# Patient Record
Sex: Female | Born: 1960
Health system: Southern US, Community
[De-identification: ages and names within clinical notes are randomized; demographics above are authoritative.]

## PROBLEM LIST (undated history)

## (undated) DIAGNOSIS — F419 Anxiety disorder, unspecified: Secondary | ICD-10-CM

## (undated) DIAGNOSIS — K829 Disease of gallbladder, unspecified: Secondary | ICD-10-CM

## (undated) DIAGNOSIS — E669 Obesity, unspecified: Secondary | ICD-10-CM

## (undated) DIAGNOSIS — K219 Gastro-esophageal reflux disease without esophagitis: Secondary | ICD-10-CM

## (undated) DIAGNOSIS — M549 Dorsalgia, unspecified: Secondary | ICD-10-CM

## (undated) DIAGNOSIS — I10 Essential (primary) hypertension: Secondary | ICD-10-CM

## (undated) DIAGNOSIS — K5790 Diverticulosis of intestine, part unspecified, without perforation or abscess without bleeding: Secondary | ICD-10-CM

## (undated) DIAGNOSIS — J45909 Unspecified asthma, uncomplicated: Secondary | ICD-10-CM

## (undated) DIAGNOSIS — R0602 Shortness of breath: Secondary | ICD-10-CM

## (undated) HISTORY — DX: Anxiety disorder, unspecified: F41.9

## (undated) HISTORY — DX: Disease of gallbladder, unspecified: K82.9

## (undated) HISTORY — DX: Dorsalgia, unspecified: M54.9

## (undated) HISTORY — DX: Obesity, unspecified: E66.9

## (undated) HISTORY — DX: Unspecified asthma, uncomplicated: J45.909

## (undated) HISTORY — PX: TONSILLECTOMY: SUR1361

## (undated) HISTORY — DX: Diverticulosis of intestine, part unspecified, without perforation or abscess without bleeding: K57.90

## (undated) HISTORY — PX: TRIGGER FINGER RELEASE: SHX641

## (undated) HISTORY — DX: Shortness of breath: R06.02

---
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
## 1993-12-14 HISTORY — PX: SINUS EXPLORATION: SHX5214

## 2000-12-14 HISTORY — PX: CHOLECYSTECTOMY: SHX55

## 2002-01-13 ENCOUNTER — Encounter: Payer: Self-pay | Admitting: Family Medicine

## 2002-01-13 ENCOUNTER — Ambulatory Visit (HOSPITAL_COMMUNITY): Admission: RE | Admit: 2002-01-13 | Discharge: 2002-01-13 | Payer: Self-pay | Admitting: Family Medicine

## 2002-10-22 ENCOUNTER — Inpatient Hospital Stay (HOSPITAL_COMMUNITY): Admission: EM | Admit: 2002-10-22 | Discharge: 2002-10-24 | Payer: Self-pay | Admitting: Emergency Medicine

## 2002-10-22 ENCOUNTER — Encounter: Payer: Self-pay | Admitting: Emergency Medicine

## 2002-10-26 ENCOUNTER — Ambulatory Visit (HOSPITAL_COMMUNITY): Admission: RE | Admit: 2002-10-26 | Discharge: 2002-10-26 | Payer: Self-pay | Admitting: Family Medicine

## 2002-10-26 ENCOUNTER — Encounter: Payer: Self-pay | Admitting: Family Medicine

## 2002-11-23 ENCOUNTER — Encounter: Payer: Self-pay | Admitting: Family Medicine

## 2002-11-23 ENCOUNTER — Ambulatory Visit (HOSPITAL_COMMUNITY): Admission: RE | Admit: 2002-11-23 | Discharge: 2002-11-23 | Payer: Self-pay | Admitting: Family Medicine

## 2005-01-06 ENCOUNTER — Other Ambulatory Visit: Admission: RE | Admit: 2005-01-06 | Discharge: 2005-01-06 | Payer: Self-pay | Admitting: Obstetrics and Gynecology

## 2005-01-13 ENCOUNTER — Ambulatory Visit (HOSPITAL_COMMUNITY): Admission: RE | Admit: 2005-01-13 | Discharge: 2005-01-13 | Payer: Self-pay | Admitting: Obstetrics and Gynecology

## 2005-05-26 ENCOUNTER — Ambulatory Visit: Payer: Self-pay | Admitting: Family Medicine

## 2005-05-26 ENCOUNTER — Emergency Department (HOSPITAL_COMMUNITY): Admission: EM | Admit: 2005-05-26 | Discharge: 2005-05-26 | Payer: Self-pay | Admitting: Emergency Medicine

## 2005-09-24 ENCOUNTER — Encounter (HOSPITAL_COMMUNITY): Admission: RE | Admit: 2005-09-24 | Discharge: 2005-10-24 | Payer: Self-pay | Admitting: Family Medicine

## 2005-12-24 ENCOUNTER — Encounter (HOSPITAL_COMMUNITY): Admission: RE | Admit: 2005-12-24 | Discharge: 2006-01-23 | Payer: Self-pay | Admitting: General Surgery

## 2006-01-04 ENCOUNTER — Inpatient Hospital Stay (HOSPITAL_COMMUNITY): Admission: AD | Admit: 2006-01-04 | Discharge: 2006-01-06 | Payer: Self-pay | Admitting: General Surgery

## 2006-01-05 ENCOUNTER — Encounter (INDEPENDENT_AMBULATORY_CARE_PROVIDER_SITE_OTHER): Payer: Self-pay | Admitting: General Surgery

## 2006-03-24 ENCOUNTER — Emergency Department (HOSPITAL_COMMUNITY): Admission: EM | Admit: 2006-03-24 | Discharge: 2006-03-24 | Payer: Self-pay | Admitting: Emergency Medicine

## 2006-05-24 ENCOUNTER — Other Ambulatory Visit: Admission: RE | Admit: 2006-05-24 | Discharge: 2006-05-24 | Payer: Self-pay | Admitting: Obstetrics and Gynecology

## 2006-05-24 ENCOUNTER — Ambulatory Visit (HOSPITAL_COMMUNITY): Admission: RE | Admit: 2006-05-24 | Discharge: 2006-05-24 | Payer: Self-pay | Admitting: Obstetrics and Gynecology

## 2006-07-02 ENCOUNTER — Emergency Department (HOSPITAL_COMMUNITY): Admission: EM | Admit: 2006-07-02 | Discharge: 2006-07-02 | Payer: Self-pay | Admitting: Emergency Medicine

## 2006-10-05 ENCOUNTER — Emergency Department (HOSPITAL_COMMUNITY): Admission: RE | Admit: 2006-10-05 | Discharge: 2006-10-05 | Payer: Self-pay | Admitting: Family Medicine

## 2006-12-14 HISTORY — PX: ABDOMINAL HYSTERECTOMY: SHX81

## 2007-02-09 ENCOUNTER — Ambulatory Visit (HOSPITAL_COMMUNITY): Admission: RE | Admit: 2007-02-09 | Discharge: 2007-02-09 | Payer: Self-pay | Admitting: Obstetrics and Gynecology

## 2007-02-21 ENCOUNTER — Ambulatory Visit (HOSPITAL_COMMUNITY): Admission: RE | Admit: 2007-02-21 | Discharge: 2007-02-21 | Payer: Self-pay | Admitting: Family Medicine

## 2007-03-11 ENCOUNTER — Encounter (INDEPENDENT_AMBULATORY_CARE_PROVIDER_SITE_OTHER): Payer: Self-pay | Admitting: Specialist

## 2007-03-11 ENCOUNTER — Ambulatory Visit (HOSPITAL_COMMUNITY): Admission: RE | Admit: 2007-03-11 | Discharge: 2007-03-11 | Payer: Self-pay | Admitting: Obstetrics and Gynecology

## 2007-03-30 ENCOUNTER — Ambulatory Visit: Admission: RE | Admit: 2007-03-30 | Discharge: 2007-03-30 | Payer: Self-pay | Admitting: Gynecologic Oncology

## 2007-04-19 ENCOUNTER — Encounter (INDEPENDENT_AMBULATORY_CARE_PROVIDER_SITE_OTHER): Payer: Self-pay | Admitting: Specialist

## 2007-04-19 ENCOUNTER — Inpatient Hospital Stay (HOSPITAL_COMMUNITY): Admission: RE | Admit: 2007-04-19 | Discharge: 2007-04-20 | Payer: Self-pay | Admitting: Obstetrics and Gynecology

## 2007-05-10 ENCOUNTER — Ambulatory Visit: Admission: RE | Admit: 2007-05-10 | Discharge: 2007-05-10 | Payer: Self-pay | Admitting: Gynecologic Oncology

## 2007-09-21 ENCOUNTER — Ambulatory Visit (HOSPITAL_COMMUNITY): Admission: RE | Admit: 2007-09-21 | Discharge: 2007-09-21 | Payer: Self-pay | Admitting: Obstetrics and Gynecology

## 2007-12-15 ENCOUNTER — Encounter: Payer: Self-pay | Admitting: Family Medicine

## 2008-02-14 ENCOUNTER — Ambulatory Visit (HOSPITAL_COMMUNITY): Admission: RE | Admit: 2008-02-14 | Discharge: 2008-02-14 | Payer: Self-pay | Admitting: Family Medicine

## 2008-02-16 ENCOUNTER — Ambulatory Visit (HOSPITAL_COMMUNITY): Admission: RE | Admit: 2008-02-16 | Discharge: 2008-02-16 | Payer: Self-pay | Admitting: Emergency Medicine

## 2008-02-17 ENCOUNTER — Ambulatory Visit: Payer: Self-pay | Admitting: Cardiology

## 2009-07-02 DIAGNOSIS — I10 Essential (primary) hypertension: Secondary | ICD-10-CM | POA: Insufficient documentation

## 2009-07-02 DIAGNOSIS — R0789 Other chest pain: Secondary | ICD-10-CM | POA: Insufficient documentation

## 2009-11-04 ENCOUNTER — Encounter (HOSPITAL_COMMUNITY): Admission: RE | Admit: 2009-11-04 | Discharge: 2009-12-04 | Payer: Self-pay | Admitting: Internal Medicine

## 2010-01-30 ENCOUNTER — Ambulatory Visit (HOSPITAL_COMMUNITY): Admission: RE | Admit: 2010-01-30 | Discharge: 2010-01-30 | Payer: Self-pay | Admitting: Family Medicine

## 2010-07-10 ENCOUNTER — Ambulatory Visit (HOSPITAL_BASED_OUTPATIENT_CLINIC_OR_DEPARTMENT_OTHER): Admission: RE | Admit: 2010-07-10 | Discharge: 2010-07-10 | Payer: Self-pay | Admitting: Orthopedic Surgery

## 2011-01-03 ENCOUNTER — Encounter: Payer: Self-pay | Admitting: Family Medicine

## 2011-01-03 ENCOUNTER — Encounter: Payer: Self-pay | Admitting: General Surgery

## 2011-01-04 ENCOUNTER — Encounter: Payer: Self-pay | Admitting: Family Medicine

## 2011-01-04 ENCOUNTER — Encounter: Payer: Self-pay | Admitting: Obstetrics and Gynecology

## 2011-01-04 ENCOUNTER — Encounter: Payer: Self-pay | Admitting: Internal Medicine

## 2011-01-13 NOTE — Letter (Signed)
Summary: rpc chart  rpc chart   Imported By: Curtis Sites 09/25/2010 15:21:49  _____________________________________________________________________  External Attachment:    Type:   Image     Comment:   External Document

## 2011-02-28 LAB — POCT I-STAT, CHEM 8
Calcium, Ion: 1.14 mmol/L (ref 1.12–1.32)
Creatinine, Ser: 0.7 mg/dL (ref 0.4–1.2)
Hemoglobin: 12.9 g/dL (ref 12.0–15.0)
Sodium: 144 mEq/L (ref 135–145)
TCO2: 26 mmol/L (ref 0–100)

## 2011-02-28 LAB — POCT HEMOGLOBIN-HEMACUE: Hemoglobin: 12.1 g/dL (ref 12.0–15.0)

## 2011-02-28 LAB — GLUCOSE, CAPILLARY: Glucose-Capillary: 157 mg/dL — ABNORMAL HIGH (ref 70–99)

## 2011-04-28 NOTE — Letter (Signed)
February 17, 2008    [name], M.D.  [address], [address]  [place], [place] [address]   RE:  [name], [name]  MRN:  [id number]  /  DOB:  [birth year]   Dear Loraine Leriche:   It was my pleasure evaluating [name] in the office today at your  request for chest discomfort.  As you know, [name] suffered a  prolonged episode of mild-to-moderate chest pressure a few days ago  during our recent snowstorm.  She was cleaning her car in cold weather  when she felt chest pressure in the mid substernal region.  There was no  radiation.  There were no associated symptoms.  She could not find  anything that exacerbated or improved her discomfort.  She subsequently  returned to her house and noted fading of these sensations over the  course of an hour or two.  She came to your office a few days later and  was feeling fine.  Evaluation included an unremarkable chemistry  profile, a normal CBC, a borderline elevated hemoglobin A1c level, and a  borderline elevated D-dimer level.  CT scan of her chest showed minor  chronic changes at the left base.  She continues to feel well, but due  to multiple cardiovascular risk factors, there continues to be concern  for possible coronary disease.   [name] has never previously been evaluated by a cardiologist.  She has not undergone any significant cardiac testing.  She has been  treated for mild hypertension with good control.  She previously  required substantial treatment for diabetes but has not taken any  medication for some time after approximately a 150-pound weight loss.  Prior lipid profiles have reportedly been fairly good.   PAST MEDICAL HISTORY:  1. Remote tonsillectomy.  2. Cholecystectomy in January of 2007.  3. Hysterectomy in May of 2008 at which time she was continuing to      have menstrual periods.   CURRENT MEDICATIONS:  1. Premarin 1.25 mg daily.  2. Metoprolol 100 mg daily.  3. Amlodipine 10 mg daily.  4.  Benicar/HCT 20/12.5 mg daily.  5. Zegerid 1 daily.  6. Allegra 160 mg daily.   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   FAMILY HISTORY:  Sketchy; mother had diabetes.   REVIEW OF SYSTEMS:  Notable for the need for corrective lenses, upper  dentures, and a history of peptic ulcer disease and reflux.  All other  systems reviewed and are negative.   PHYSICAL EXAMINATION:  GENERAL:  Pleasant overweight [name] in no acute  distress.  VITAL SIGNS:  The weight is 240 pounds.  Blood pressure 145/80, heart  rate 75 and regular, respirations 16.  HEENT:  Grade 1 hypertensive changes on funduscopic exam.  NECK:  No jugular venous distention; normal carotid upstrokes without  bruits.  LUNGS:  Clear.  CARDIAC:  Normal first and second heart sounds; fourth heart sound  present with soft systolic ejection murmur.  ABDOMEN:  Soft and nontender; no organomegaly; excessive and lax skin.  EXTREMITIES:  Trace edema; distal pulses intact.  NEUROLOGIC:  Symmetric strength and tone; normal cranial nerves.  ENDOCRINE:  No thyromegaly.  HEMATOPOIETIC:  No adenopathy.  PSYCHIATRIC:  Alert and oriented; normal affect.   EKG:  Normal sinus rhythm; borderline left atrial abnormality;  borderline delayed R wave progression; otherwise unremarkable.  No prior  tracing for comparison.   LABORATORY DATA:  Laboratory from  your office is generally unremarkable.  Hemoglobin A1c level was 7.2.  Fasting glucose was 124.   IMPRESSION:  [name] had a single episode of somewhat worrisome  chest discomfort, both in terms of its quality and location as well as  the fact that it occurred in the setting of physical exertion and cold  exposure.  Nonetheless, [name] has had similar physiologic stress  since then without symptoms.  Her cardiovascular risk is somewhat  difficult to calculate in that she previously had significant  diabetes,  but improved dramatically with weight loss and has had only mild and  well-controlled hypertension.  I would consider the likelihood of  coronary disease to be well less than 25%.  We will proceed with a  stress echocardiogram if images are adequate; otherwise, a stress  nuclear study will be performed.  I will let you know the results of  that test as soon as it has been completed.  Otherwise, [name]  current medical therapy is generally excellent.  You might want to  discontinue Premarin as soon as she can tolerate doing so.   Thanks so much for sending Tina very nice lady to see me.    Sincerely,      Gerrit Friends. Dietrich Pates, MD, [facility]  Electronically Signed    RMR/MedQ  DD: 02/17/2008  DT: 02/19/2008  Job #: [id number]

## 2011-04-28 NOTE — Consult Note (Signed)
NAMEGLADYSE, [name]            ACCOUNT NO.:  [id number]   MEDICAL RECORD NO.:  [id number]          PATIENT TYPE:  OUT   LOCATION:  GYN                          FACILITY:  [facility]   PHYSICIAN:  Paola A. Duard Brady, MD    DATE OF BIRTH:  10/28/1961   DATE OF CONSULTATION:  05/10/2007  DATE OF DISCHARGE:                                 CONSULTATION   The patient is a 50 year old with diagnosis of endometrial cancer who  underwent TLH/BSO on Apr 19, 2007.  Pathology was consistent with a stage  IB, grade 1, endometrioid adenocarcinoma with negative washings, 2 mm of  myometrial invasion out of 3.9 cm of myometrial invasion.  No  lymphovascular space involvement with negative washings.  She comes in  today for her postoperative check.  She has multiple complaints.  She  complains of feeling tired and weak for the last 3 days.  She has not  been sleeping well.  She has not had this issue in the past.  She is not  napping.  When asked multiple times whether it is that she is having  intermittent waking or not being able to fall asleep, she has never  really been able to answer the question.  It does not seem that hot  flashes are waking her up.  She just cannot sleep and she denies  sleeping excessively during the day.  She does complain of hot flashes,  about one every hour and half.  Effexor was called in with no relief of  her symptoms, however, she only took it 1 day and stopped taking it  because it caused her to feel weak.  She is also complaining of some low  back pain, some low pelvic pain and pressure.  She is voiding about  every 30 minutes.  She feels dehydrated and, when queried, she is  drinking less than a liter per day.  She, otherwise, denies any pain.  She states the pain she has had from this surgery has been less than  that she had from her cholecystectomy.   PHYSICAL EXAMINATION:  Well-nourished, well-developed female in no acute  distress.  ABDOMEN:  Shows well-healed  laparoscopy skin incisions.  Abdomen is  soft, nontender, nondistended, it is morbidly obese.  There is a  significant pannus.  There is no erythema or discharge.  PELVIC:  External genitalia within normal limits, though somewhat atrophic.  The  vaginal is visualized, the vaginal cuff is visualized.  Sutures are  still visible.  She has a small amount of exudate in the midportion of  the cuff which is easily wiped away.  BIMANUAL EXAMINATION:  There is no cuff tenderness.  There are no  palpable masses.   ASSESSMENT:  90. A 50 year old who has stage IB, grade 1, endometrial carcinoma, who      is doing fairly well from a postoperative standpoint.  Her symptoms      are consistent with a potential urinary tract infection.  I do not      have a away of sending urinalysis here in the clinic.  Therefore,  will treat her empirically with Bactrim DS, one orally twice a day.      I discussed with her the need to drink more fluids than she is      drinking, she is not drinking enough, and encouraged her to drink      about 64 ounces a day at a minimum and to include cranberry juice,      water, and noncaffeinated beverages.  2. We also discussed her vasomotor symptoms.  She is having      significant symptoms which are interfering with her quality of      life.  After a discussion regarding risks and benefits, she opted      for Premarin.  She was given prescription and samples for 0.625      milligrams, one orally daily.  She was alerted of the risks of      Premarin, including but not limited to thromboembolic events, and      she was given signs and symptoms to monitor and to      notify us if she has any symptomatology.  3. She will return to see [place] in 4 months.  At that time we will begin      alternating visits with Dr. Sylvester Harder.   The patient's questions were elicited and answered to her satisfaction.  She is feeling better after her visit today.      Paola A. Duard Brady,  MD  Electronically Signed     PAG/MEDQ  D:  05/10/2007  T:  05/10/2007  Job:  [id number]   cc:   Fayrene Fearing A. Ashley Royalty, M.D.  Fax: [phone]   Telford Nab, R.N.  [address]  [place], [place] [address]

## 2011-05-01 NOTE — Procedures (Signed)
NAMETVISHA, SCHWOERER            ACCOUNT NO.:  [id number]   MEDICAL RECORD NO.:  [id number]          PATIENT TYPE:  EMS   LOCATION:  ED                            FACILITY:  [facility]   PHYSICIAN:  Edward L. Juanetta Gosling, M.D.DATE OF BIRTH:  [birth year]   DATE OF PROCEDURE:  05/26/2005  DATE OF DISCHARGE:  05/26/2005                                EKG INTERPRETATION   IMPRESSION:  The rhythm is sinus rhythm with a rate of 70. There is  generally high QRS voltage. The computers read early repolarization and I do  not see evidence of that. Minimally abnormal electrocardiogram.       ELH/MEDQ  D:  05/27/2005  T:  05/28/2005  Job:  [id number]

## 2011-05-01 NOTE — Discharge Summary (Signed)
[name], [name]            ACCOUNT NO.:  [id number]   MEDICAL RECORD NO.:  [id number]          PATIENT TYPE:  INP   LOCATION:  A338                          FACILITY:  [facility]   PHYSICIAN:  Dirk Dress. Katrinka Blazing, M.D.   DATE OF BIRTH:  September 12, 1961   DATE OF ADMISSION:  01/04/2006  DATE OF DISCHARGE:  01/24/2007LH                                 DISCHARGE SUMMARY   DISCHARGE DIAGNOSES:  1.  Acalculous cholecystitis.  2.  Hypertension.  3.  Diabetes mellitus.  4.  Lumbar disk disease.  5.  Obesity.   PROCEDURE:  Laparoscopic cholecystectomy January 23.   DISPOSITION:  The patient is discharged home in stable, satisfactory  condition.   DISCHARGE MEDICATIONS:  1.  Potassium chloride 20 mEq daily.  2.  Norvasc 10 mg daily.  3.  Toprol XL 100 mg daily.  4.  Benicar 20/12.5 daily.  5.  Aspirin 81 mg daily.   FOLLOW UP:  The patient will be seen in the office 2 weeks post discharge.   HOSPITAL COURSE:  A 50 year old female with a history of recurrent  epigastric and right upper quadrant pain with radiation through to her back  on the right side and subscapular area.  Pain has gotten worse over the past  2 weeks.  She two HIDA scans which showed an ejection fraction of 27% and  19.4%.  The patient was waiting to have his surgery scheduled electively,  but her pain became unrelenting and was quite severe on a daily basis.  She  had constant nausea without vomiting.  She had a very tender abdomen  suspicious for acute exacerbation of acalculous cholecystitis with acute  inflammation.  The patient was therefore admitted.  She was started on IV  antibiotics and urgent cholecystectomy was scheduled.  She underwent  cholecystectomy on January 23, uneventfully.  She was doing much better in  the postoperative period.  All of her pain, nausea and vomiting resolved.  Her back pain resolved.  She was discharged home on the postop day #1 in  satisfactory condition.     Dirk Dress. Katrinka Blazing,  M.D.  Electronically Signed    LCS/MEDQ  D:  02/14/2006  T:  02/15/2006  Job:  [id number]

## 2011-05-01 NOTE — H&P (Signed)
[name], [name]            ACCOUNT NO.:  [id number]   MEDICAL RECORD NO.:  [id number]          PATIENT TYPE:  INP   LOCATION:  A338                          FACILITY:  [facility]   PHYSICIAN:  Dirk Dress. Katrinka Blazing, M.D.   DATE OF BIRTH:  Aug 17, 1961   DATE OF ADMISSION:  01/04/2006  DATE OF DISCHARGE:  LH                                HISTORY & PHYSICAL   HISTORY OF PRESENT ILLNESS:  A 50 year old female with a history of  recurrent epigastric and right upper quadrant pain with radiation through to  her back on the right side in the subscapular level.  The pain has gotten  much worse over the past 2 weeks.  She has had 2 HIDA scans which show  reduced ejection fraction of 27% and 19.4%.  The patient was waiting to have  her surgery scheduled electively, but her pain has become unrelenting and is  severe everyday.  She has constant nausea without vomiting, and she has a  very tender abdomen, suspicious for acute exacerbation of acalculous  cholecystitis with acute inflammation.  The patient is admitted and will  make arrangements for IV antibiotics and schedule urgent cholecystectomy.   PAST HISTORY:  1.  He has hypertension.  2.  Gastroesophageal reflux disease.  3.  Noninsulin-dependent diabetes mellitus.  4.  Degenerative disk disease of the lumbar spine.  5.  Chronic obesity.   MEDICATIONS:  1.  Benicar 20/12.5 daily.  2.  Norvasc 10 mg daily.  3.  Phenergan 25 mg every 4 hours as needed.  4.  Lyrica 75 mg twice daily as needed.  5.  Nexium 40 mg daily.  6.  Aspirin 81 mg daily.  7.  Toprol-XL 100 mg daily.   SOCIAL HISTORY:  She is employed at [employer] as a Psychologist, sport and exercise.  There is no history of alcohol or drug abuse.  No history of tobacco use.   PHYSICAL EXAMINATION:  VITAL SIGNS:  Blood pressure 138/70, pulse 80,  respirations 20, weight 250 pounds.  HEENT:  Unremarkable.  NECK:  Supple.  No JVD, bruit, adenopathy or thyromegaly.  CHEST:  Clear to  auscultation.  HEART:  Regular rate and rhythm without murmur, gallop, or rub.  ABDOMEN:  Obese, distended.  Moderately severe epigastric and right upper  quadrant tenderness with guarding and rebound.  Active bowel sounds.  BACK:  Unremarkable.  It is felt that the pain she has in her back is a  radicular or referred type pain.  EXTREMITIES:  No clubbing, cyanosis, or edema.  NEUROLOGIC:  No focal motor, sensory or cerebellar deficits.   IMPRESSION:  1.  Acalculous cholecystitis, chronic, with acute exacerbation.  2.  Hypertension.  3.  Diet-controlled diabetes mellitus.  4.  Lumbar disk disease.  5.  Obesity.   PLAN:  The patient is admitted.  She will be started on IV antibiotics.  Her  symptoms will be controlled with analgesics and anti-emetics, and we will  make arrangements for a cholecystectomy in the morning.      Dirk Dress. Katrinka Blazing, M.D.  Electronically Signed     LCS/MEDQ  D:  01/04/2006  T:  01/04/2006  Job:  [id number]

## 2011-05-01 NOTE — Discharge Summary (Signed)
   [name], [name]                      ACCOUNT NO.:  [id number]   MEDICAL RECORD NO.:  [id number]                   PATIENT TYPE:  INP   LOCATION:  A308                                 FACILITY:  [facility]   PHYSICIAN:  Corrie Mckusick, M.D.               DATE OF BIRTH:  10/14/1961   DATE OF ADMISSION:  10/22/2002  DATE OF DISCHARGE:  10/24/2002                                 DISCHARGE SUMMARY   HISTORY OF PRESENT ILLNESS:  For history of presenting illness and past  medical history please see admission H&P.   HOSPITAL COURSE:  A 50 year old with diabetes, hypertension who presented  with viral gastroenteritis and viral labyrinthitis.  She was admitted for IV  fluids and monitoring.  She also presented with hypokalemia which has  resolved slowly.  Blood sugars remained well controlled during the hospital  stay.  Hypokalemia was also resolved.  On discharge, potassium had increased  to 3.6.  Vital signs had remained stable.  Blood pressure was coming down  nicely with the addition of Vasotec.   On the day of discharge the patient had improved greatly.  She was still  slightly dizzy but was able to ambulate quite easily.  Good p.o. intake.   DISCHARGE PHYSICAL EXAMINATION:  VITAL SIGNS:  Tmax 99.5, blood pressure 151  to 160 over 80, blood sugars are 100 to 124.  GENERAL:  A pleasant female in no acute distress.  CHEST:  Clear to auscultation bilaterally.  CARDIOVASCULAR:  Regular rhythm with no murmurs.  ABDOMEN:  Soft, nontender, nondistended.  EXTREMITIES:  No edema.   LABORATORY DATA:  As stated above.   DISCHARGE MEDICATIONS:  Are the same as admit plus:  1. Vasotec 10 mg daily.  2. Antivert 25 mg q.i.d. p.r.n.  3. Potassium chloride 10 mEq daily.   FOLLOW-UP:  With [facility] in one week after discharge and will  recheck Chem-7 at that time.  If any problems arise at any time she is to  call or return.                                               Corrie Mckusick, M.D.    JCG/MEDQ  D:  10/24/2002  T:  10/24/2002  Job:  [id number]

## 2011-05-01 NOTE — Consult Note (Signed)
[name], [name]            ACCOUNT NO.:  [id number]   MEDICAL RECORD NO.:  [id number]          PATIENT TYPE:  OUT   LOCATION:  GYN                          FACILITY:  [facility]   PHYSICIAN:  John T. Kyla Balzarine, M.D.    DATE OF BIRTH:  Apr 22, 1961   DATE OF CONSULTATION:  DATE OF DISCHARGE:                                 CONSULTATION   CHIEF COMPLAINT:  This 50 year old woman is seen at the request of Dr.  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   HISTORY OF PRESENT ILLNESS:  The patient presented with right-sided pain  and back pain.  She had an ultrasound which revealed thickening of the  endometrial and bilateral small, simple ovarian cysts.  Office biopsy  could not be performed because of stenosis.  She underwent hysteroscopy  with endometrial curettage and polypectomy on March 28, revealing  endometrial adenocarcinoma, FIGO grade I, arising in atypical complex  hyperplasia.  Patient has had only spotting since then.   PAST MEDICAL HISTORY:  Hypertension, GERD, status post wrist surgery,  cesarean section, and tubal sterilization.   MEDICATIONS:  Toprol, Benicar, aspirin, Norvasc, and multivitamins.  She  is on an H2 blocker for GERD.   ALLERGIES:  None known.   FAMILY HISTORY:  Hypertension and diabetes but no breast, gynecologic,  or colon malignancy.   PERSONAL SOCIAL HISTORY:  Denies tobacco or ethanol.   REVIEW OF SYSTEMS:  Other than above, negative.   PHYSICAL EXAMINATION:  VITAL SIGNS:  Weight 231 pounds.  Height 5 feet,  4 inches.  GENERAL:  Patient is anxious, alert, and oriented x3 in no acute  distress.  HEENT:  Benign with clear oropharynx.  NECK:  There is a supple neck without goiter.  LUNGS:  Lung fields are clear to auscultation and percussion.  HEART:  Heart sounds reveal a regular rate and rhythm.  No gallop or  JVD.  BACK:  There is no back or CVA  tenderness.  EXTREMITIES:  No edema, cords, or Homans.  SKIN:  No suspicious lesions.  NEUROLOGIC:  Intact.  PELVIC:  External genitalia and BUS are normal to inspection and  palpation.  Bladder and urethra are normal.  The vaginal mucosa is clear  with a small amount of old blood.  Cervix is small with healing  tenaculum site.  Bimanual and rectovaginal examination suggest upper  limits size uterus with no adnexal mass or parametrial nodularity.  There is some uterine mobility assessment.  Grade I endometrioid  adenocarcinoma.   PLAN:  I had a long discussion with the patient regarding management of  her disease.  I would advocate a total laparoscopic hysterectomy with  BSO and washings.  Pelvic lymph node dissection would be begun, and  specimen  sent for frozen section.  If minimal tumor, the node dissection would  not be extended to include aortic nodes.  We discussed risks for  conversion to laparotomy, risks and benefits of minimally invasive  approach versus laparotomy.  Surgery is tentatively scheduled for May  13.  John T. Kyla Balzarine, M.D.  Electronically Signed     JTS/MEDQ  D:  03/30/2007  T:  03/31/2007  Job:  [id number]   cc:   Fayrene Fearing A. Ashley Royalty, M.D.  Fax: [phone]   Telford Nab, R.N.  [address]  [place], [place] [address]

## 2011-05-01 NOTE — Op Note (Signed)
[name], [name]            ACCOUNT NO.:  [id number]   MEDICAL RECORD NO.:  [id number]          PATIENT TYPE:  INP   LOCATION:  A338                          FACILITY:  [facility]   PHYSICIAN:  Dirk Dress. Katrinka Blazing, M.D.   DATE OF BIRTH:  28-Jan-1961   DATE OF PROCEDURE:  01/05/2006  DATE OF DISCHARGE:                                 OPERATIVE REPORT   PREOPERATIVE DIAGNOSIS:  Acalculous cholecystitis.   POSTOPERATIVE DIAGNOSIS:  Acalculous cholecystitis.   PROCEDURE:  Laparoscopic cholecystectomy.   SURGEON:  Dirk Dress. Katrinka Blazing, M.D.   DESCRIPTION OF PROCEDURE:  Under general anesthesia the patient's abdomen  was prepped and draped in a sterile field.  A supraumbilical incision was  made.  A Veress needle was inserted uneventfully.  The abdomen was  insufflated with 3 liters of CO2.  Using a Vis-A-Port guide a 10-mm port was  placed.  A laparoscope was placed.  A very large, distended, slightly  thickened gallbladder was encountered.  The patient was placed in reverse  Trendelenburg position.   Under videoscopic guidance a 10-mm port and two 5-mm ports were placed in  the right subcostal region.  The gallbladder was grasped and positioned.  Adhesions to the lower gallbladder were dissected bluntly. The cystic duct  was dissected, clipped with 5 clips close to the infundibulum and divided.  The cystic artery had two branches.  They were dissected back tot he  gallbladder, clipped with 3 clips and divided.  Using electrocautery and the  hook dissector, the gallbladder was then separated from the infrahepatic bed  without difficulty. It was placed in an EndoCatch device and retrieved.   Irrigation was carried out  Hemostasis was felt to be adequate.  There was  essentially no bleeding.  Irrigation was carried out until the fluid was  clear.  CO2 was allowed to escape from the abdomen and the ports were then  removed.  The incisions were closed using #0 Vicryl at the umbilicus, and  the  staples on all skin incisions.  The patient tolerated the procedure  well.  Dressings were placed. She was awakened from anesthesia uneventfully,  transferred to a bed, and taken to the postanesthetic care unit in  satisfactory condition.      Dirk Dress. Katrinka Blazing, M.D.  Electronically Signed     LCS/MEDQ  D:  01/05/2006  T:  01/06/2006  Job:  [id number]

## 2011-05-01 NOTE — H&P (Signed)
[name], [name]            ACCOUNT NO.:  [id number]   MEDICAL RECORD NO.:  [id number]          PATIENT TYPE:  AMB   LOCATION:  SDC                           FACILITY:  [facility]   PHYSICIAN:  James A. Ashley Royalty, M.D.DATE OF BIRTH:  March 04, 1961   DATE OF ADMISSION:  DATE OF DISCHARGE:                              HISTORY & PHYSICAL   This is a 50 year old gravida 1, para 1, who presented February 2008,  complaining of back pain.  She also had a oligomenorrhea.  She has a  known fibroid uterus.  She is also status post tubal sterilization  procedure.  Ultrasound was obtained February 09, 2007.  Though a  sonohysterogram was requested, it could not be completed successfully  due to cervical stenosis.  The patient did have some thickening of the  endometrium as well as bilateral ovarian cyst of 4.0 and 3.1 cm in  greatest diameter respectively.  She presents for diagnostic/operative  hysteroscopy as well as dilatation and curettage.   MEDICATIONS:  Toprol, Benicar, ASA, Norvasc, and multivitamins.   PAST MEDICAL HISTORY:  MEDICAL:  Hypertension, gastroesophageal reflux  disease.  SURGICAL:  Wrist surgery, cesarean section, and tubal sterilization  procedure.   ALLERGIES:  None.   FAMILY HISTORY:  Positive for hypertension and diabetes.   SOCIAL HISTORY:  The patient denies use of tobacco or alcohol.   REVIEW OF SYSTEMS:  Noncontributory.   PHYSICAL EXAMINATION:  Please see most recent office evaluation.  Well-developed, well-nourished, pleasant Black female in no acute  distress.  Afebrile.  VITAL SIGNS:  Stable.  CHEST:  Lungs clear.  CARDIAC:  Regular rate and rhythm.  ABDOMEN:  Soft and nontender.  PELVIC:  External genitalia within normal limits.  VAGINA AND CERVIX:  Without gross lesions.  BIMANUAL:  Examination is difficult to due to the patient's obesity.  Some nodularity to the uterus was noted.  No adnexal masses could be  palpated.   IMPRESSION:  1. Fibroid  uterus.  2. History of intrauterine polyp or fibroid.  3. Oligomenorrhea.  4. Obesity.  5. Hypertension.  6. Cervical stenosis.   PLAN:  1. Diagnostic/operative hysteroscopy.  2. Dilatation and curettage.   Risks, benefits, complications, and alternatives fully discussed with  the patient.  She states she understands and consents.  Questions  invited and answered.      James A. Ashley Royalty, M.D.  Electronically Signed     JAM/MEDQ  D:  03/11/2007  T:  03/11/2007  Job:  [id number]

## 2011-05-01 NOTE — Op Note (Signed)
[name], [name]            ACCOUNT NO.:  [id number]   MEDICAL RECORD NO.:  [id number]          PATIENT TYPE:  AMB   LOCATION:  DFTL                          FACILITY:  [facility]   PHYSICIAN:  James A. Ashley Royalty, M.D.DATE OF BIRTH:  08-13-1961   DATE OF PROCEDURE:  03/11/2007  DATE OF DISCHARGE:                               OPERATIVE REPORT   PREOPERATIVE DIAGNOSES:  1. Intrauterine polyp versus fibroid.  2. Endometrial thickening on ultrasound.  3. Fibroid uterus.  4. History of oligomenorrhea.   POSTOPERATIVE DIAGNOSES:  1. Apparent endometrial polyp versus fibroid. Path pending.  2. Enlarged uterus.   PROCEDURE:  1. Diagnostic/operative hysteroscopy with polypectomy/myomectomy.  2. Dilatation and curettage.   SURGEON:  Rudy Jew. Ashley Royalty, M.D.   ANESTHESIA:  General.   ESTIMATED BLOOD LOSS:  Less than 50 mL.   DEFICIT:  290 mL.   COMPLICATIONS:  None.   PACKS AND DRAINS:  None.   PROCEDURE:  The patient was taken to the operating room, placed in the  dorsal supine position.  After general anesthetic was administered, she  was placed in the lithotomy position and prepped and draped in the usual  manner for vaginal surgery.  Posterior weighted retractor was placed per  vagina.  The anterior lip of the cervix grasped with a single-tooth  tenaculum.  A small Pratt dilator was introduced into the cervix in  order to ascertain tubal patency.  Next a uterine sound was introduced  and the measurement noted to be 14 cm of depth.  The cervix was then  dilated to a size 25 [place] using [employer] dilators.  The resectoscope was  then placed into the uterine cavity using sorbitol as a distension  medium.  The endometrial cavity was thoroughly inspected.  There was an  anterior polyp versus fibroid arising from the anterior aspect of the  uterine cavity.  The tubal ostia were visualized bilaterally.  The  remainder of the cavity was without additional polyps or fibroids.  However,  the lining of the cavity was somewhat irregular.  Endocervical  visualization was benign.  Appropriate photos were obtained throughout.  Next the anterior pedunculated polyp versus fibroid was resected using  the cutting waveform at approximately 100 watts power.  The specimen was  submitted to pathology in a piecemeal fashion labeled as endometrial  polyp versus fibroid.  Hemostasis was obtained using the coagulation  waveform at approximately 50 watts of power.   Next, attention was turned to the uterine curettage.  A medium sized  curette was placed into the uterine cavity.  First, a 4 quadrant  curettage was performed.  Then a therapeutic curettage was performed.  All curettings were submitted to pathology for histologic studies.   The resectoscope was once again placed in the uterine cavity.  Any  residual oozing sites were coagulated with the coagulation waveform.  Appropriate photos were obtained.  At this point, the vaginal  instruments were removed, hemostasis noted, and the procedure  terminated.  I requested the photographs as I left the room and was told  they would bring them to me in the dictation area.  While I was  dictating the operative note, I was informed that most of the  photographs were lost due to the fact that one of staff members turned  off the machine prematurely.   The patient was returned to the recovery room in excellent condition.      James A. Ashley Royalty, M.D.  Electronically Signed     JAM/MEDQ  D:  03/11/2007  T:  03/11/2007  Job:  [id number]

## 2011-05-01 NOTE — H&P (Signed)
[name], [name]                      ACCOUNT NO.:  [id number]   MEDICAL RECORD NO.:  [id number]                   PATIENT TYPE:  INP   LOCATION:  A308                                 FACILITY:  [facility]   PHYSICIAN:  Sarita Bottom, M.D.                  DATE OF BIRTH:  Nov 09, 1961   DATE OF ADMISSION:  10/22/2002  DATE OF DISCHARGE:                                HISTORY & PHYSICAL   CHIEF COMPLAINT:  I have been vomiting.   HISTORY OF PRESENT ILLNESS:  The patient is a 50 year old lady with a  history of diabetes mellitus, type 2.  She was up and doing well until this  afternoon when she developed an insidious onset of nausea and vomiting.  She  vomited several times at home.  The vomiting was also associated with 1  episode of watery stool.  She also complains of intermittent chills but  denies any fever.  She denies any hematemesis, denies any melena in her  stools.  The vomitus contained previously eaten food and mucus.  She decided  to activate EMS on account of the above.   REVIEW OF SYSTEMS:  GENERAL:  She admits to weakness and admits to chills,  but no fever.  RESPIRATORY:  Denies any shortness of breath or any cough.  CVS:  Denies any chest pain or palpitations.  GI:  Admits nausea, vomiting  and diarrhea.  CNS:  Admits to dizziness.  EXTREMITIES:  No edema.  MUSCULOSKELETAL:  No back pain.   PAST MEDICAL HISTORY:  1. Diabetes mellitus, type 2.  2. History of hypertension, but she says she is not taking any medicine at     the moment.  3. History of severe infection.   MEDICATIONS:  Insulin 70/20, 20 units in the morning.   ALLERGIES:  She denies any drug allergies.   FAMILY HISTORY:  Significant for diabetes mellitus, type 2, in her mother.   SOCIAL HISTORY:  She is divorced with 1 child.  She does not smoke and does  not drink alcohol.   PHYSICAL EXAMINATION:  VITALS:  Blood pressure is 185/78, heart rate of 62, respiratory rate of 16,  the patient was  afebrile.  GENERAL:  She is a middle-aged, obese lady lying on a stretcher with  intermittent retching and shaking chills.  HEENT:  The patient is not pale.  Pupils are equal and reactive to light and  accommodation.  She is anicteric.  She has moist oral mucosa.  NECK:  Supple.  No jugular venous distention, no carotid bruit.  CHEST:  Air entry adequate bilaterally.  Breath sounds are fascicular.  No  crackles were heard.  CVS:  Heart sounds 1 and 2, regular rhythm and rate.  No murmurs were  appreciated.  ABDOMEN:  Soft, __________  diffuse tenderness.  No masses or organomegaly  with palpitation.  CNS:  She is alert and oriented x3.  EXTREMITIES:  She has no pedal edema and pedal pulses are 2+ bilaterally.   LABORATORY DATA:  Sodium of 137, potassium of 3.3, chloride of 105, CO2 of  31, BUN of 9, creatinine of 0.6, glucose 190, calcium of 9.5.  WBC 6.5,  neutrophils of 73%, lymphocytes of 22%, hemoglobin of 12.2, hematocrit of  36.3, MCV of 86.6 and platelet count of 282.  Liver function test was  essentially normal.  Chest x-ray was pending.   ASSESSMENT:  1. Vomiting and diarrhea, probably secondary to gastroenteritis.  The     patient will be admitted for IV hydration.  She will be kept on a clear     liquid diet as tolerated and progressed to a solid diet if she tolerates     it.  She will be given Phenergan 25 mg IV q.6 h p.r.n. for nausea.  2. For the hypokalemia, which is slight, the patient will be given IV     potassium chloride 10 mg x4 doses and her __________  in the morning.  3. For the history of diabetes mellitus, because the patient is vomiting and     might not be eating well, her regular insulin dosage will be held, and     the patient will be put on __________  coverage.  4. For the hypertension, which is uncontrolled, the patient will be started     on Vasotec 5 mg p.o. q.d.  5. Further management will depend on the patient's clinical course.  6. The patient  will be admitted under the service of Dr. Regino Schultze.                                               Sarita Bottom, M.D.    DW/MEDQ  D:  10/22/2002  T:  10/22/2002  Job:  [id number]

## 2011-05-01 NOTE — Op Note (Signed)
NAMEKRISSI, WILLAIMS            ACCOUNT NO.:  [id number]   MEDICAL RECORD NO.:  [id number]          PATIENT TYPE:  INP   LOCATION:  1537                         FACILITY:  [facility]   PHYSICIAN:  John T. Kyla Balzarine, M.D.    DATE OF BIRTH:  December 10, 1961   DATE OF PROCEDURE:  04/19/2007  DATE OF DISCHARGE:                               OPERATIVE REPORT   SURGEON:  Ronita Hipps, MD   ASSISTANT:  Lonell Face, MD   PREOPERATIVE DIAGNOSIS:  Grade I endometrial cancer.   POSTOPERATIVE DIAGNOSIS:  Grade 1 endometrial cancer with minimal  myometrial invasion.   PROCEDURE:  Laparoscopy with total laparoscopic hysterectomy and BSO,  peritoneal washings.   ANESTHESIA:  General endotracheal.   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  She had a 12 week size uterus on examination under anesthesia.  At  laparoscopy, there were extensive pelvic adhesions between the uterine  fundus and bladder anteriorly and the lower uterine segment was  elongated, with cervix and lower uterine segment measuring in excess of  7 cm to a globular uterine fundus.  There was no evidence of  intraperitoneal metastasis, with normal adnexa and normal peritoneal  surfaces throughout.  Omental adhesions were present to the anterior  abdominal wall, at the site of prior laparoscopic cholecystectomy.  Frozen section revealed minimal if any myometrial invasion, grade 1  endometrial adenocarcinoma and adenomatous hyperplasia with atypia.  For  this reason, it was elected not to perform lymph node dissection.   DESCRIPTION OF PROCEDURE:  The patient was positioned prior to induction  of anesthesia in the low lithotomy position using direct placement  stirrups, with arms tucked at the sides and appropriately padded, and  with shoulder restraints.  Following  induction of anesthesia, the  abdomen and vagina were sterilely prepped with Betadine.  Foley catheter  was placed.  A speculum was placed and cervix grasped with a tenaculum.  The uterus sounded to 9-10 cm and was serially dilated with Shawnie Pons  dilators to a #23.  The ZUMI uterine manipulator was placed with a  medium KOH ring adjacent to the cervix.  The patient was then draped.   A left subumbilical incision was made in the skin after pre-injection  with local anesthesia.  A 10 mm skin incision was made and a 10/11 the  trocar placed using direct visualization technique.  When  intraperitoneal placement was confirmed, pneumoperitoneum was  established.  Three accessory ports were placed after pre-injection with  local anesthesia including two 5 mm lateral pelvic ports and a 10/12 mm  suprapubic port.  All trocars were placed under direct visualization.  Abdominal and pelvic survey was conducted laparoscopically with the  findings described above.  There were adhesions of omentum to the  anterior abdominal wall in the right upper abdomen, but these did not  interfere with the surgical procedure and were left intact. Sixty mL of  normal saline were instilled into the abdomen and collected for  cytology.  Total laparoscopic hysterectomy with bilateral salpingo-oophorectomy was  performed using the Harmonic Ace for hemostasis and division of the  pedicles unless otherwise specified.  The round ligaments bilaterally  were transected.  Anterior and posterior peritoneal reflections of the  broad ligament were then opened.  Adhesions between bladder and anterior  uterine fundus were taken down and the anterior peritoneal reflection  was entered.  The pararectal spaces bilaterally were partially  developed, ureters visualized, and on each side, the infundibulopelvic  ligaments were isolated above the ureter, cauterized and divided with  the Harmonic Ace.   The medial leaf of the broad  ligament was taken down on each side, above  the ureter.  The bladder flap was advanced sequentially off of the lower  uterine segment, cervix and upper vagina using  dissection and cautery  with the Harmonic Ace, until the KOH ring lip was identified anteriorly.  Uterine arteries bilaterally were skeletonized.  Because of the globular  uterine fundus and poor exposure caused by the patient's habitus, the  uterine vessels were transected relatively high and developed into  pedicles by dividing adjacent to the lower uterine segment and cervix  with the harmonic scalpel, allowing sequential exposure.   The cardinal ligament complex on each side was taken with monopolar  cautery using the endoscopic shears.  Vaginotomy was performed against  the KOH ring using the endoscopic shears.  Again, visualization was  difficult and the patient had marked accessory vascularity through a  well developed cardinal ligament and uterosacral ligament complexes.  These were eventually divided and vaginotomy completed.  The uterus and  adnexa were removed transvaginally.  Because of the large uterine  fundus, this required approximately 30 minutes of transvaginal  manipulation, but the specimen was removed intact and submitted for  frozen section.  Frozen section subsequently returned positive for  atypical adenomatous hyperplasia and grade 1 endometrial adenocarcinoma  with minimal, if any myometrial invasion and likely adenomyosis.   The vaginal cuff was closed with figure-of-eight sutures of 0 Vicryl  using the endoscopic shuttlecock instrument with extracorporeal knots.  Additional hemostasis was achieved where necessary with electrocautery.  The abdomen and pelvis were copiously irrigated and hemostasis at all  major pedicles assured.  The ureters were visualized and noted to be  intact, away from the transection of the uterine vessels.  The pneumoperitoneum was deflated and all trocars removed.  The  fascial  figure-of-eight sutures of 0 Vicryl were placed at the site of the 10/12  mm trocars and the skin incisions were closed with subcuticular sutures  of 3-0 Vicryl reinforced with Steri-Strips.  The patient tolerated the  procedure well and was returned to the recovery room in stable  condition.   ESTIMATED BLOOD LOSS:  800 mL.   TRANSFUSIONS:  None.   DRAINS AND PACKS, ETC.:  Foley to dependent drainage.  Sponge and sponge  counts correct.   PATHOLOGY:  Uterus, tubes and ovaries for frozen section, peritoneal  washings for cytology.      John T. Kyla Balzarine, M.D.  Electronically Signed     JTS/MEDQ  D:  04/20/2007  T:  04/20/2007  Job:  [id number]   cc:   Fayrene Fearing A. Ashley Royalty, M.D.  Fax: [phone]   Telford Nab, R.N.  [address]  [place], [place] [address]

## 2011-05-01 NOTE — Discharge Summary (Signed)
[name], [name]            ACCOUNT NO.:  [id number]   MEDICAL RECORD NO.:  [id number]          PATIENT TYPE:  INP   LOCATION:  1537                         FACILITY:  [facility]   PHYSICIAN:  Rudy Jew. Ashley Royalty, M.D.DATE OF BIRTH:  [birth year]   DATE OF ADMISSION:  04/19/2007  DATE OF DISCHARGE:  04/20/2007                               DISCHARGE SUMMARY   DISCHARGE DIAGNOSIS:  Endometrial carcinoma - pathology pending.   OPERATIONS AND SPECIAL PROCEDURES:  Total laparoscopic hysterectomy,  bilateral salpingo-oophorectomy, pelvic washings.   HISTORY AND PHYSICAL:  This is a 51 year old patient who was seen in  consultation by Dr. Ronita Hipps at the request of Dr. Ashley Royalty for  recommendations regarding grade 1 endometrial carcinoma arising in  atypical or complex hyperplasia. For the remainder of the history and  physical, please see chart.   HOSPITAL COURSE:  The patient was admitted to [facility].  Admission laboratory studies were drawn. On Apr 19, 2007, she was taken  to the operating room and underwent total laparoscopic hysterectomy and  bilateral salpingo-oophorectomy and pelvic washings. The procedure was  performed by Dr. Kyla Balzarine with Dr. Ashley Royalty assisting. Procedure was  uncomplicated. The patient was felt to be stable for discharge on the  first postoperative morning and was discharged home afebrile and in  satisfactory condition. At the time of this dictation, pathology is  pending.   DISPOSITION:  The patient is to return to Dr. Kyla Balzarine and Dr. Ashley Royalty per  Harriett Sine Wilkinson's recommendations.      James A. Ashley Royalty, M.D.  Electronically Signed     JAM/MEDQ  D:  05/11/2007  T:  05/11/2007  Job:  [id number]

## 2011-05-04 ENCOUNTER — Other Ambulatory Visit (HOSPITAL_COMMUNITY): Payer: Self-pay | Admitting: Family Medicine

## 2011-05-04 DIAGNOSIS — Z1231 Encounter for screening mammogram for malignant neoplasm of breast: Secondary | ICD-10-CM

## 2011-05-14 ENCOUNTER — Ambulatory Visit (HOSPITAL_COMMUNITY): Payer: PRIVATE HEALTH INSURANCE

## 2011-05-15 ENCOUNTER — Observation Stay (HOSPITAL_COMMUNITY)
Admission: EM | Admit: 2011-05-15 | Discharge: 2011-05-16 | Disposition: A | Discharge status: Home / Self Care | Payer: PRIVATE HEALTH INSURANCE | Attending: Internal Medicine | Admitting: Internal Medicine

## 2011-05-15 ENCOUNTER — Emergency Department (HOSPITAL_COMMUNITY): Payer: PRIVATE HEALTH INSURANCE

## 2011-05-15 DIAGNOSIS — R1013 Epigastric pain: Principal | ICD-10-CM | POA: Insufficient documentation

## 2011-05-15 DIAGNOSIS — E871 Hypo-osmolality and hyponatremia: Secondary | ICD-10-CM | POA: Insufficient documentation

## 2011-05-15 DIAGNOSIS — I10 Essential (primary) hypertension: Secondary | ICD-10-CM | POA: Insufficient documentation

## 2011-05-15 DIAGNOSIS — E119 Type 2 diabetes mellitus without complications: Secondary | ICD-10-CM | POA: Insufficient documentation

## 2011-05-15 DIAGNOSIS — E876 Hypokalemia: Secondary | ICD-10-CM | POA: Insufficient documentation

## 2011-05-15 DIAGNOSIS — R1012 Left upper quadrant pain: Secondary | ICD-10-CM | POA: Insufficient documentation

## 2011-05-15 DIAGNOSIS — R079 Chest pain, unspecified: Secondary | ICD-10-CM | POA: Insufficient documentation

## 2011-05-15 DIAGNOSIS — Z79899 Other long term (current) drug therapy: Secondary | ICD-10-CM | POA: Insufficient documentation

## 2011-05-15 LAB — BASIC METABOLIC PANEL
CO2: 27 mEq/L (ref 19–32)
Calcium: 9.7 mg/dL (ref 8.4–10.5)
Chloride: 96 mEq/L (ref 96–112)
Creatinine, Ser: 0.58 mg/dL (ref 0.4–1.2)
GFR calc Af Amer: >60 mL/min (ref >60)
Sodium: 134 mEq/L — ABNORMAL LOW (ref 135–145)

## 2011-05-15 LAB — CBC
HCT: 34.8 % — ABNORMAL LOW (ref 36.0–46.0)
Hemoglobin: 11.9 g/dL — ABNORMAL LOW (ref 12.0–15.0)
MCH: 29.4 pg (ref 26.0–34.0)
MCHC: 34.2 g/dL (ref 30.0–36.0)
RBC: 4.05 MIL/uL (ref 3.87–5.11)

## 2011-05-15 LAB — URINALYSIS, ROUTINE W REFLEX MICROSCOPIC
Bilirubin Urine: NEGATIVE
Glucose, UA: NEGATIVE mg/dL
Hgb urine dipstick: NEGATIVE
Specific Gravity, Urine: <1.005 — ABNORMAL LOW (ref 1.005–1.030)
Urobilinogen, UA: 0.2 mg/dL (ref 0.0–1.0)
pH: 6.5 (ref 5.0–8.0)

## 2011-05-15 LAB — TROPONIN I: Troponin I: <0.3 ng/mL (ref <0.30)

## 2011-05-15 LAB — DIFFERENTIAL
Lymphocytes Relative: 39 % (ref 12–46)
Lymphs Abs: 3.5 10*3/uL (ref 0.7–4.0)
Monocytes Absolute: 0.7 10*3/uL (ref 0.1–1.0)
Monocytes Relative: 8 % (ref 3–12)
Neutro Abs: 4.2 10*3/uL (ref 1.7–7.7)
Neutrophils Relative %: 47 % (ref 43–77)

## 2011-05-16 LAB — HEMOGLOBIN A1C: Hgb A1c MFr Bld: 6.9 % — ABNORMAL HIGH (ref <5.7)

## 2011-05-16 LAB — LIPID PANEL
Cholesterol: 215 mg/dL — ABNORMAL HIGH (ref 0–200)
Total CHOL/HDL Ratio: 3.3 RATIO
Triglycerides: 157 mg/dL — ABNORMAL HIGH (ref <150)
VLDL: 31 mg/dL (ref 0–40)

## 2011-05-16 LAB — CARDIAC PANEL(CRET KIN+CKTOT+MB+TROPI)
CK, MB: 2.9 ng/mL (ref 0.3–4.0)
Relative Index: 1.8 (ref 0.0–2.5)
Troponin I: <0.3 ng/mL (ref <0.30)

## 2011-05-16 LAB — MRSA PCR SCREENING: MRSA by PCR: NEGATIVE

## 2011-05-16 LAB — GLUCOSE, CAPILLARY: Glucose-Capillary: 112 mg/dL — ABNORMAL HIGH (ref 70–99)

## 2011-05-16 NOTE — Discharge Summary (Signed)
[name], [name]            ACCOUNT NO.:  [id number]  MEDICAL RECORD NO.:  [id number]           PATIENT TYPE:  O  LOCATION:  A322                          FACILITY:  [facility]  PHYSICIAN:  Wilson Singer, M.D.DATE OF BIRTH:  Apr 29, 1961  DATE OF ADMISSION:  05/15/2011 DATE OF DISCHARGE:  06/02/2012LH                              DISCHARGE SUMMARY   PRIMARY CARE PHYSICIAN:  Annia Friendly. Hill, MD.  FINAL DISCHARGE DIAGNOSIS:  Epigastric abdominal pain likely gastritis, stress-induced.  CONDITION ON DISCHARGE:  Stable.  MEDICATIONS ON DISCHARGE:  The patient will continue all her home medications which include: 1. Metoprolol 50 mg b.i.d. 2. Premarin 0.625 mg b.i.d. 3. Aspirin 81 mg daily. 4. Janumet 50/500 b.i.d. 5. Norvasc, dose unclear daily. 6. Benicar with HCTZ, dose unclear daily. 7. Xanax p.r.n., dose unclear daily p.r.n. 8. Carafate unspecified frequency.  HISTORY:  This is a pleasant 50 year old lady who has been rather stress lately, working 3 jobs, 7 days a week, came in with left upper and epigastric abdominal pain.  Please see initial history and physical examination done by Dr. Vedia Coffer.  HOSPITAL PROGRESS:  The patient really is adamant that her pain is more epigastric and left upper quadrant rather than chest pain, which is what she was admitted for.  In fact on closer questioning that appears to be correct.  The pain is aggravated by eating rather than any things such as exertion.  Her electrocardiogram shows nonspecific T-wave changes in AVL and V2 with PVCs.  There is no real acute ST-T wave changes otherwise.  Her first set of serial cardiac enzyme is negative.  Today, she feels like she had some abdominal pain, but nothing of much significance.  PHYSICAL EXAMINATION:  VITAL SIGNS:  Temperature 98.4, blood pressure 142/78, pulse 63, saturation 98% on room air. GENERAL:  She looks systemically well. HEART:  Heart sounds are present and normal  without pericardial rub. LUNGS:  Lung fields are clear without pleural rub. ABDOMEN:  Soft and slightly tender at the site of her pain, which is in the epigastric and left upper quadrant area.  There are no masses felt and she did not have hepatosplenomegaly.  OTHER INVESTIGATIONS:  Hemoglobin 11.9, white blood cell count 8.9, platelets 274.  Sodium 134, potassium 3.0 for which she had repletion, bicarbonate 27, BUN 9, creatinine 0.58.  DISPOSITION:  The patient is really stable to be discharge and I really do not believe she has any cardiac source of her pain.  I think this is more likely to be epigastric, this is more likely to be gastritis, and she has had the same symptoms before which have been relieved by simple antacids, which she has at home and she can take.  She also has a proton pump inhibitor at home, which also I have advised her to take.  I have asked her to follow up with her primary care physician in the next 2-3 weeks.     Wilson Singer, M.D.     NCG/MEDQ  D:  05/16/2011  T:  05/16/2011  Job:  [id number] cc:   Annia Friendly. Loleta Chance, MD Fax: [phone]  Electronically Signed by Lilly Cove M.D. on 05/16/2011 02:14:44 PM

## 2011-05-17 NOTE — H&P (Signed)
NAMEKENITRA, [name]            ACCOUNT NO.:  [id number]  MEDICAL RECORD NO.:  [id number]           PATIENT TYPE:  O  LOCATION:  A322                          FACILITY:  [facility]  PHYSICIAN:  Vania Rea, M.D. DATE OF BIRTH:  01-17-1961  DATE OF ADMISSION:  05/15/2011 DATE OF DISCHARGE:  LH                             HISTORY & PHYSICAL   PRIMARY CARE PHYSICIAN:  Annia Friendly. Hill, MD  CHIEF COMPLAINT:  Chest pain.  HISTORY OF PRESENT ILLNESS:  This is a 50 year old obese African American lady who came to the emergency room complaining of chest and in the left upper quadrant abdominal pain radiating through to the back. She has been having this pain for a week, but it is now getting worse.  It is aggravated by eating.  It is not aggravated by movement or breathing.  There is no diaphoresis.  There is no nausea or vomiting.  She has been having no black or bloody stool. the pain was intially relieved by antacids but is no longer so.  She does have a history of hiatal hernia, and takes Dexilant episodically.  Past history of chest pain in 2009, was seen by Dr. Dietrich Pates, but did not followup for cardiac stress testing.  She came to the emergency room for an EKG, was seen and evaluated by the emergency room physician who noted she was having an abnormal EKG and called for admission.  PAST MEDICAL HISTORY: 1. Diabetes. 2. Hypertension.  PAST SURGICAL HISTORY:  She is status post hysterectomy.  MEDICATIONS:  Include metoprolol 50 mg twice daily, Premarin 0.625 mg twice daily, aspirin 81 mg daily, Janumet 50/500 twice daily, Norvasc daily, Benicar with HCTZ 25 daily, Xanax as needed, Carafate unspecified frequency.  ALLERGIES:  SULFA.  SOCIAL HISTORY:  Denies alcohol, tobacco, or illicit drug use.  She works three different jobs, as a Engineer, civil (consulting), as a Scientist, clinical (histocompatibility and immunogenetics), and Media planner.  She is currently undergoing a lot of stress because of changes with her drop at [facility].  FAMILY HISTORY:  Denies any family history of coronary artery disease.  REVIEW OF SYSTEMS:  Other than the fact that she is undergoing a lot of stress.  Complete review of systems unremarkable.  PHYSICAL EXAMINATION:  GENERAL:  Obese middle-aged African-American lady, lying in bed, not in any acute distress.  VITALS:  Her temperature is 98.1, pulse 68, respirations 18, blood pressure 134/79.  She is saturating at 98% on room air.  HEENT:  Her pupils are round and equal. Mucous membranes pink.  Anicteric.  No cervical lymphadenopathy.  No thyromegaly.  No carotid bruit.  No jugular venous distention.  CHEST: Clear to auscultation bilaterally.  CARDIOVASCULAR:  Regular rhythm.  No murmur.  ABDOMEN:  Obese and soft.  She has exquisite epigastric tenderness.  No masses.  Normal abdominal bowel sounds.  EXTREMITIES: Without edema.  Arthritic deformities of the knees.  CENTRAL NERVOUS SYSTEM:  Cranial nerves II through XII are grossly intact.  She has no focal lateralizing signs.  LABORATORY DATA:  Her labs white count is 8.9, hemoglobin 11.9, platelets 274.  Differential, eosinophils 6% with absolute lymphocyte  count of 600.  Her sodium is 134, potassium 3.0, chloride 96, CO2 of 27, glucose 124, BUN 9, creatinine 0.58, calcium 9.7.  Her cardiac enzymes are completely normal with undetectable troponins with total CK 152, CK- MB 0.8.  Urinalysis is unremarkable with a specific gravity reported as being undetectable.  Two-view chest x-ray shows no acute abnormality. Her EKG shows sinus rhythm with premature ventricular contractions and a prolonged QT.  ASSESSMENT: 1. Chest pain likely related to gastroesophageal reflux disease, not     typical for cardiac disease. 2. Hypertension. 3. Diabetes type 2. 4. Hypokalemia, probably related to diuretic use. 5. Abnormal EKG related to hypokalemia and hyponatremia.  PLAN: 1. We will keep this lady on observation for telemetry  monitoring.  We     will give her high-dose Protonix and correct her potassium and we     will recommend that if her cardiac workup is negative, she can     probably see a cardiologist as an outpatient and to follow up with     a gastroenterologist.  We have pointed out to her that she has     never had upper nor lower endoscopy at age 70.  She should at least     have colonoscopy. 2. I have advised to say that she needs to pay some attention to     stress management. 3. Other plans as per orders.     Vania Rea, M.D.     LC/MEDQ  D:  05/16/2011  T:  05/16/2011  Job:  [id number]  cc:   Gerrit Friends. Dietrich Pates, MD, [facility] [address] [place], [place] [address]  Annia Friendly. Loleta Chance, MD Fax: [phone]  Electronically Signed by Vania Rea M.D. on 05/17/2011 05:00:56 AM

## 2011-05-18 DIAGNOSIS — R079 Chest pain, unspecified: Secondary | ICD-10-CM

## 2011-05-25 ENCOUNTER — Ambulatory Visit (HOSPITAL_COMMUNITY): Payer: PRIVATE HEALTH INSURANCE

## 2012-03-17 ENCOUNTER — Emergency Department (HOSPITAL_COMMUNITY)
Admission: EM | Admit: 2012-03-17 | Discharge: 2012-03-17 | Disposition: A | Discharge status: Home / Self Care | Payer: PRIVATE HEALTH INSURANCE | Attending: Emergency Medicine | Admitting: Emergency Medicine

## 2012-03-17 ENCOUNTER — Other Ambulatory Visit: Payer: Self-pay

## 2012-03-17 ENCOUNTER — Encounter (HOSPITAL_COMMUNITY): Payer: Self-pay | Admitting: *Deleted

## 2012-03-17 DIAGNOSIS — R109 Unspecified abdominal pain: Secondary | ICD-10-CM | POA: Insufficient documentation

## 2012-03-17 DIAGNOSIS — I10 Essential (primary) hypertension: Secondary | ICD-10-CM | POA: Insufficient documentation

## 2012-03-17 DIAGNOSIS — E119 Type 2 diabetes mellitus without complications: Secondary | ICD-10-CM | POA: Insufficient documentation

## 2012-03-17 DIAGNOSIS — R197 Diarrhea, unspecified: Secondary | ICD-10-CM

## 2012-03-17 DIAGNOSIS — R1110 Vomiting, unspecified: Secondary | ICD-10-CM | POA: Insufficient documentation

## 2012-03-17 DIAGNOSIS — R6883 Chills (without fever): Secondary | ICD-10-CM | POA: Insufficient documentation

## 2012-03-17 HISTORY — DX: Essential (primary) hypertension: I10

## 2012-03-17 LAB — DIFFERENTIAL
Basophils Absolute: 0 10*3/uL (ref 0.0–0.1)
Basophils Relative: 0 % (ref 0–1)
Eosinophils Absolute: 0.1 10*3/uL (ref 0.0–0.7)
Eosinophils Relative: 1 % (ref 0–5)
Neutrophils Relative %: 81 % — ABNORMAL HIGH (ref 43–77)

## 2012-03-17 LAB — CBC
MCH: 28.8 pg (ref 26.0–34.0)
MCV: 85.8 fL (ref 78.0–100.0)
Platelets: 266 10*3/uL (ref 150–400)
RBC: 4.45 MIL/uL (ref 3.87–5.11)
RDW: 13.4 % (ref 11.5–15.5)

## 2012-03-17 LAB — BASIC METABOLIC PANEL
Calcium: 9.3 mg/dL (ref 8.4–10.5)
GFR calc Af Amer: >90 mL/min (ref >90)
GFR calc non Af Amer: >90 mL/min (ref >90)
Potassium: 3.2 mEq/L — ABNORMAL LOW (ref 3.5–5.1)
Sodium: 138 mEq/L (ref 135–145)

## 2012-03-17 MED ORDER — MORPHINE SULFATE 4 MG/ML IJ SOLN
4.0000 mg | Freq: Once | INTRAMUSCULAR | Status: DC
  Filled 2012-03-17: qty 1

## 2012-03-17 MED ORDER — ONDANSETRON HCL 4 MG/2ML IJ SOLN
4.0000 mg | Freq: Once | INTRAMUSCULAR | Status: AC
  Administered 2012-03-17: 4 mg via INTRAVENOUS
  Filled 2012-03-17: qty 2

## 2012-03-17 MED ORDER — KETOROLAC TROMETHAMINE 30 MG/ML IJ SOLN
30.0000 mg | Freq: Once | INTRAMUSCULAR | Status: AC
  Administered 2012-03-17: 30 mg via INTRAVENOUS
  Filled 2012-03-17: qty 1

## 2012-03-17 MED ORDER — PROMETHAZINE HCL 25 MG PO TABS: 25.0000 mg | ORAL_TABLET | Freq: Four times a day (QID) | ORAL | Status: DC | PRN

## 2012-03-17 MED ORDER — POTASSIUM CHLORIDE CRYS ER 20 MEQ PO TBCR
40.0000 meq | EXTENDED_RELEASE_TABLET | Freq: Once | ORAL | Status: AC
  Administered 2012-03-17: 40 meq via ORAL
  Filled 2012-03-17: qty 2

## 2012-03-17 NOTE — ED Notes (Signed)
Pt DC to home with family.  Pt verbalizes understanding of DC instructions.  Pt ambulatory with steady gait

## 2012-03-17 NOTE — ED Provider Notes (Signed)
History     CSN: [id number]  Arrival date & time 03/17/12  1427   First MD Initiated Contact with Patient 03/17/12 1440      Chief Complaint  Patient presents with  . Emesis     Patient is a 51 y.o. female presenting with vomiting. The history is provided by the patient.  Emesis  This is a new problem. The current episode started 6 to 12 hours ago. The problem occurs 2 to 4 times per day. The problem has been gradually worsening. The emesis has an appearance of stomach contents. There has been no fever. Associated symptoms include abdominal pain, chills and diarrhea. Pertinent negatives include no fever. Risk factors include ill contacts.  nothing improves her symptoms Nothing worsens her symptoms  Pt presents for onset of vomiting/diarrhea (nonbloody) since this morning +sick contacts No recent foreign travel She had otherwise been well until this morning No cp/sob reported   Past Medical History  Diagnosis Date  . Hypertension   . Diabetes mellitus     Past Surgical History  Procedure Date  . Abdominal hysterectomy   . Cholecystectomy   . Cesarean section   . Sinus exploration   . Carpal tunnel release   . Wrist surgery   . Trigger finger release     Family History  Problem Relation Age of Onset  . Hypertension Mother   . Diabetes Mother     History  Substance Use Topics  . Smoking status: Never Smoker   . Smokeless tobacco: Not on file  . Alcohol Use: No    OB History    Grav Para Term Preterm Abortions TAB SAB Ect Mult Living                  Review of Systems  Constitutional: Positive for chills. Negative for fever.  Gastrointestinal: Positive for vomiting, abdominal pain and diarrhea.  All other systems reviewed and are negative.    Allergies  Barium-containing compounds and Sulfa antibiotics  Home Medications   Current Outpatient Rx  Name Route Sig Dispense Refill  . ACETAMINOPHEN 500 MG PO TABS Oral Take 1,000 mg by mouth at bedtime  as needed. For pain    . AMLODIPINE BESYLATE 10 MG PO TABS Oral Take 10 mg by mouth daily.    . ASPIRIN EC 81 MG PO TBEC Oral Take 81 mg by mouth daily.    Marland Kitchen ESTROGENS CONJUGATED 0.625 MG PO TABS Oral Take 0.625 mg by mouth daily. Take daily for 21 days then do not take for 7 days.    Marland Kitchen FLUTICASONE PROPIONATE 50 MCG/ACT NA SUSP Nasal Place 2 sprays into the nose daily.    Marland Kitchen METOPROLOL TARTRATE 50 MG PO TABS Oral Take 50 mg by mouth 2 (two) times daily.    Marland Kitchen OLMESARTAN MEDOXOMIL-HCTZ 40-25 MG PO TABS Oral Take 1 tablet by mouth daily.    Marland Kitchen SITAGLIPTIN-METFORMIN HCL 50-500 MG PO TABS Oral Take 1 tablet by mouth 2 (two) times daily.      BP 155/78  Pulse 83  Temp(Src) 97.4 F (36.3 C) (Oral)  Resp 18  Ht 5\' 3"  (1.6 m)  Wt 237 lb (107.502 kg)  BMI 41.98 kg/m2  SpO2 100%  Physical Exam CONSTITUTIONAL: Well developed/well nourished HEAD AND FACE: Normocephalic/atraumatic EYES: EOMI/PERRL, no icterus ENMT: Mucous membranes dry NECK: supple no meningeal signs SPINE:entire spine nontender CV: S1/S2 noted, no murmurs/rubs/gallops noted LUNGS: Lungs are clear to auscultation bilaterally, no apparent distress ABDOMEN: soft, nontender, no  rebound or guarding GU:no cva tenderness NEURO: Pt is awake/alert, moves all extremitiesx4 EXTREMITIES: pulses normal, full ROM SKIN: warm, color normal PSYCH: no abnormalities of mood noted  ED Course  Procedures  Labs Reviewed  DIFFERENTIAL - Abnormal; Notable for the following:    Neutrophils Relative 81 (*)    Lymphocytes Relative 11 (*)    All other components within normal limits  BASIC METABOLIC PANEL - Abnormal; Notable for the following:    Potassium 3.2 (*)    Glucose, Bld 166 (*)    All other components within normal limits  CBC   5:40 PM Pt improved Taking PO at this time  Pt improved No vomiting Pain improved abd soft on my exam Stable for d/c  The patient appears reasonably screened and/or stabilized for discharge and I  doubt any other medical condition or other [facility] requiring further screening, evaluation, or treatment in the ED at this time prior to discharge.    MDM  Nursing notes reviewed and considered in documentation All labs/vitals reviewed and considered        Date: 03/17/2012  Rate: 84  Rhythm: normal sinus rhythm  QRS Axis: normal  Intervals: normal  ST/T Wave abnormalities: nonspecific ST changes  Conduction Disutrbances:none  Narrative Interpretation:   Old EKG Reviewed: unchanged    Joya Gaskins, MD 03/17/12 1920

## 2012-03-17 NOTE — ED Notes (Signed)
NVD, fever, onset this am

## 2012-03-17 NOTE — ED Notes (Signed)
Patient refusing morphine at this time, wanting to try a non-narcotic. Dr Bebe Shaggy made aware.

## 2012-04-15 ENCOUNTER — Other Ambulatory Visit: Payer: Self-pay | Admitting: Otolaryngology

## 2012-04-18 ENCOUNTER — Other Ambulatory Visit: Payer: PRIVATE HEALTH INSURANCE

## 2012-04-21 ENCOUNTER — Ambulatory Visit
Admission: RE | Admit: 2012-04-21 | Discharge: 2012-04-21 | Disposition: A | Discharge status: Home / Self Care | Payer: PRIVATE HEALTH INSURANCE | Source: Ambulatory Visit | Attending: Otolaryngology | Admitting: Otolaryngology

## 2013-01-16 ENCOUNTER — Encounter (HOSPITAL_COMMUNITY): Payer: Self-pay | Admitting: *Deleted

## 2013-01-16 ENCOUNTER — Emergency Department (HOSPITAL_COMMUNITY)
Admission: EM | Admit: 2013-01-16 | Discharge: 2013-01-16 | Disposition: A | Discharge status: Home / Self Care | Payer: PRIVATE HEALTH INSURANCE | Attending: Emergency Medicine | Admitting: Emergency Medicine

## 2013-01-16 ENCOUNTER — Emergency Department (HOSPITAL_COMMUNITY): Payer: PRIVATE HEALTH INSURANCE

## 2013-01-16 DIAGNOSIS — IMO0002 Reserved for concepts with insufficient information to code with codable children: Secondary | ICD-10-CM | POA: Insufficient documentation

## 2013-01-16 DIAGNOSIS — E119 Type 2 diabetes mellitus without complications: Secondary | ICD-10-CM | POA: Insufficient documentation

## 2013-01-16 DIAGNOSIS — Z79899 Other long term (current) drug therapy: Secondary | ICD-10-CM | POA: Insufficient documentation

## 2013-01-16 DIAGNOSIS — Z7982 Long term (current) use of aspirin: Secondary | ICD-10-CM | POA: Insufficient documentation

## 2013-01-16 DIAGNOSIS — I10 Essential (primary) hypertension: Secondary | ICD-10-CM | POA: Insufficient documentation

## 2013-01-16 DIAGNOSIS — M25569 Pain in unspecified knee: Secondary | ICD-10-CM

## 2013-01-16 MED ORDER — OXYCODONE-ACETAMINOPHEN 5-325 MG PO TABS
1.0000 | ORAL_TABLET | Freq: Once | ORAL | Status: AC
  Administered 2013-01-16: 1 via ORAL
  Filled 2013-01-16: qty 1

## 2013-01-16 MED ORDER — OXYCODONE-ACETAMINOPHEN 5-325 MG PO TABS: 1.0000 | ORAL_TABLET | ORAL | Status: AC | PRN

## 2013-01-16 MED ORDER — HYDROMORPHONE HCL PF 1 MG/ML IJ SOLN: 1.0000 mg | Freq: Once | INTRAMUSCULAR | Status: DC

## 2013-01-16 MED ORDER — NAPROXEN 500 MG PO TABS: 500.0000 mg | ORAL_TABLET | Freq: Two times a day (BID) | ORAL | Status: DC

## 2013-01-16 MED ORDER — KETOROLAC TROMETHAMINE 60 MG/2ML IM SOLN
60.0000 mg | Freq: Once | INTRAMUSCULAR | Status: AC
  Administered 2013-01-16: 60 mg via INTRAMUSCULAR
  Filled 2013-01-16: qty 2

## 2013-01-16 NOTE — ED Notes (Signed)
Pain rt knee, onset last night when stood up , had pain and says she cannot bend her knee.

## 2013-01-17 NOTE — ED Provider Notes (Signed)
History     CSN: [id number]  Arrival date & time 01/16/13  1417   First MD Initiated Contact with Patient 01/16/13 1515      Chief Complaint  Patient presents with  . Knee Pain    (Consider location/radiation/quality/duration/timing/severity/associated sxs/prior treatment) HPI Comments: Patient c/o pain to her right knee that began on the evening prior to ED arrival.  States she stood up from a chair and felt a sudden sharp pain to her knee that has been persistent since onset.  Pain is worse with weight bearing and flexion of her knee.  She denies fall, redness, swelling , numbness or weakness of the leg, or calf pain.    Patient is a 52 y.o. female presenting with knee pain. The history is provided by the patient.  Knee Pain The current episode started yesterday. The problem occurs constantly. The problem has been unchanged. Associated symptoms include arthralgias. Pertinent negatives include no chest pain, chills, fever, joint swelling, neck pain, numbness, rash, sore throat, vomiting or weakness. The symptoms are aggravated by bending, standing and walking. She has tried acetaminophen for the symptoms. The treatment provided no relief.    Past Medical History  Diagnosis Date  . Hypertension   . Diabetes mellitus     Past Surgical History  Procedure Date  . Abdominal hysterectomy   . Cholecystectomy   . Cesarean section   . Sinus exploration   . Carpal tunnel release   . Wrist surgery   . Trigger finger release     Family History  Problem Relation Age of Onset  . Hypertension Mother   . Diabetes Mother     History  Substance Use Topics  . Smoking status: Never Smoker   . Smokeless tobacco: Not on file  . Alcohol Use: No    OB History    Grav Para Term Preterm Abortions TAB SAB Ect Mult Living                  Review of Systems  Constitutional: Negative for fever and chills.  HENT: Negative for sore throat and neck pain.   Cardiovascular: Negative for  chest pain.  Gastrointestinal: Negative for vomiting.  Genitourinary: Negative for dysuria and difficulty urinating.  Musculoskeletal: Positive for arthralgias. Negative for back pain and joint swelling.  Skin: Negative for color change, rash and wound.  Neurological: Negative for weakness and numbness.  All other systems reviewed and are negative.    Allergies  Barium-containing compounds and Sulfa antibiotics  Home Medications   Current Outpatient Rx  Name  Route  Sig  Dispense  Refill  . ACETAMINOPHEN 500 MG PO TABS   Oral   Take 1,000 mg by mouth at bedtime as needed. For pain         . AMLODIPINE BESYLATE 10 MG PO TABS   Oral   Take 10 mg by mouth daily.         . ASPIRIN EC 81 MG PO TBEC   Oral   Take 81 mg by mouth daily.         Marland Kitchen ESTROGENS CONJUGATED 0.625 MG PO TABS   Oral   Take 0.625 mg by mouth daily. Take daily for 21 days then do not take for 7 days.         Marland Kitchen FLUTICASONE PROPIONATE 50 MCG/ACT NA SUSP   Nasal   Place 2 sprays into the nose daily.         Marland Kitchen METOPROLOL TARTRATE 50 MG  PO TABS   Oral   Take 50 mg by mouth 2 (two) times daily.         Marland Kitchen NAPROXEN 500 MG PO TABS   Oral   Take 1 tablet (500 mg total) by mouth 2 (two) times daily with a meal.   20 tablet   0   . OLMESARTAN MEDOXOMIL-HCTZ 40-25 MG PO TABS   Oral   Take 1 tablet by mouth daily.         . OXYCODONE-ACETAMINOPHEN 5-325 MG PO TABS   Oral   Take 1 tablet by mouth every 4 (four) hours as needed for pain.   20 tablet   0   . PROMETHAZINE HCL 25 MG PO TABS   Oral   Take 1 tablet (25 mg total) by mouth every 6 (six) hours as needed for nausea.   30 tablet   0   . SITAGLIPTIN-METFORMIN HCL 50-500 MG PO TABS   Oral   Take 1 tablet by mouth 2 (two) times daily.           BP 139/71  Pulse 74  Temp 97.5 F (36.4 C) (Oral)  Resp 20  Ht 5\' 3"  (1.6 m)  Wt 252 lb (114.306 kg)  BMI 44.64 kg/m2  SpO2 98%  Physical Exam  Nursing note and vitals  reviewed. Constitutional: She is oriented to person, place, and time. She appears well-developed and well-nourished. No distress.  Cardiovascular: Normal rate, regular rhythm, normal heart sounds and intact distal pulses.   Pulmonary/Chest: Effort normal and breath sounds normal.  Musculoskeletal: She exhibits tenderness.       Right knee: She exhibits decreased range of motion and bony tenderness. She exhibits no swelling, no effusion, no ecchymosis, no deformity, no laceration, no erythema and normal alignment. tenderness found. Patellar tendon tenderness noted.       Legs:      ttp of the anterior right knee, mostly with movement of the patella.  No erythema, excessive warmth, effusion or step-off deformity.  DP pulse is brisk, distal sensation intact,  No calf pain or edema.  Neurological: She is alert and oriented to person, place, and time. She exhibits normal muscle tone. Coordination normal.  Skin: Skin is warm and dry. No erythema.    ED Course  Procedures (including critical care time)  Labs Reviewed - No data to display Dg Knee Complete 4 Views Right  01/16/2013  *RADIOLOGY REPORT*  Clinical Data: Right knee pain.  RIGHT KNEE - COMPLETE 4+ VIEW  Comparison: None.  Findings: No fracture or dislocation is noted.  Moderate to severe narrowing of medial joint space is noted with osteophyte formation seen laterally and medially.  No joint effusion is noted. Narrowing osteophyte formation of patellofemoral joint is noted.  IMPRESSION: Moderate to severe degenerative joint disease is noted.   Original Report Authenticated By: Lupita Raider.,  M.D.      1. Knee pain       MDM   Knee immobilizer applied, pain improved, remains NV intact  Pain improved in the dept after IM Toradol.  X-ray findings discussed with patient.  She prefers orthopedic follow-up with Dr. Eulah Pont in [place], agrees to call his office to arrange f/u.  Doubt septic joint.    Will prescribe naprosyn and  percocet for pain.       Brixton Franko L. Jerrion Tabbert, [place] 01/17/13 2047  Timotheus Salm L. [place], [place] 01/17/13 2047

## 2013-01-18 NOTE — ED Provider Notes (Signed)
Medical screening examination/treatment/procedure(s) were performed by non-physician practitioner and as supervising physician I was immediately available for consultation/collaboration.  Raeford Razor, MD 01/18/13 [phone]

## 2014-05-23 ENCOUNTER — Emergency Department (HOSPITAL_COMMUNITY)
Admission: EM | Admit: 2014-05-23 | Discharge: 2014-05-23 | Disposition: A | Discharge status: Home / Self Care | Payer: Managed Care, Other (non HMO) | Attending: Emergency Medicine | Admitting: Emergency Medicine

## 2014-05-23 ENCOUNTER — Encounter (HOSPITAL_COMMUNITY): Payer: Self-pay | Admitting: Emergency Medicine

## 2014-05-23 ENCOUNTER — Emergency Department (HOSPITAL_COMMUNITY): Payer: Managed Care, Other (non HMO)

## 2014-05-23 DIAGNOSIS — I10 Essential (primary) hypertension: Secondary | ICD-10-CM | POA: Insufficient documentation

## 2014-05-23 DIAGNOSIS — J159 Unspecified bacterial pneumonia: Secondary | ICD-10-CM | POA: Insufficient documentation

## 2014-05-23 DIAGNOSIS — Z79899 Other long term (current) drug therapy: Secondary | ICD-10-CM | POA: Insufficient documentation

## 2014-05-23 DIAGNOSIS — E119 Type 2 diabetes mellitus without complications: Secondary | ICD-10-CM | POA: Insufficient documentation

## 2014-05-23 DIAGNOSIS — J189 Pneumonia, unspecified organism: Secondary | ICD-10-CM

## 2014-05-23 DIAGNOSIS — Z7982 Long term (current) use of aspirin: Secondary | ICD-10-CM | POA: Insufficient documentation

## 2014-05-23 DIAGNOSIS — Z792 Long term (current) use of antibiotics: Secondary | ICD-10-CM | POA: Insufficient documentation

## 2014-05-23 DIAGNOSIS — R51 Headache: Secondary | ICD-10-CM | POA: Insufficient documentation

## 2014-05-23 LAB — CBC WITH DIFFERENTIAL/PLATELET
Basophils Absolute: 0 10*3/uL (ref 0.0–0.1)
Basophils Relative: 0 % (ref 0–1)
EOS ABS: 0.3 10*3/uL (ref 0.0–0.7)
EOS PCT: 3 % (ref 0–5)
HEMATOCRIT: 35.2 % — AB (ref 36.0–46.0)
Hemoglobin: 12.2 g/dL (ref 12.0–15.0)
LYMPHS ABS: 2.5 10*3/uL (ref 0.7–4.0)
Lymphocytes Relative: 24 % (ref 12–46)
MCH: 30 pg (ref 26.0–34.0)
MCHC: 34.7 g/dL (ref 30.0–36.0)
MCV: 86.5 fL (ref 78.0–100.0)
MONO ABS: 1 10*3/uL (ref 0.1–1.0)
Monocytes Relative: 10 % (ref 3–12)
Neutro Abs: 6.6 10*3/uL (ref 1.7–7.7)
Neutrophils Relative %: 63 % (ref 43–77)
PLATELETS: 253 10*3/uL (ref 150–400)
RBC: 4.07 MIL/uL (ref 3.87–5.11)
RDW: 13.9 % (ref 11.5–15.5)
WBC: 10.4 10*3/uL (ref 4.0–10.5)

## 2014-05-23 LAB — COMPREHENSIVE METABOLIC PANEL
ALT: 9 U/L (ref 0–35)
AST: 11 U/L (ref 0–37)
Albumin: 3.3 g/dL — ABNORMAL LOW (ref 3.5–5.2)
Alkaline Phosphatase: 50 U/L (ref 39–117)
BUN: 16 mg/dL (ref 6–23)
CALCIUM: 8.9 mg/dL (ref 8.4–10.5)
CO2: 26 meq/L (ref 19–32)
CREATININE: 0.5 mg/dL (ref 0.50–1.10)
Chloride: 98 mEq/L (ref 96–112)
GLUCOSE: 149 mg/dL — AB (ref 70–99)
Potassium: 3.6 mEq/L — ABNORMAL LOW (ref 3.7–5.3)
SODIUM: 138 meq/L (ref 137–147)
TOTAL PROTEIN: 7.2 g/dL (ref 6.0–8.3)
Total Bilirubin: <0.2 mg/dL — ABNORMAL LOW (ref 0.3–1.2)

## 2014-05-23 MED ORDER — LEVOFLOXACIN 750 MG PO TABS
750.0000 mg | ORAL_TABLET | Freq: Once | ORAL | Status: AC
  Administered 2014-05-23: 750 mg via ORAL
  Filled 2014-05-23: qty 1

## 2014-05-23 MED ORDER — LEVOFLOXACIN 750 MG PO TABS: 750.0000 mg | ORAL_TABLET | Freq: Every day | ORAL | Status: DC

## 2014-05-23 NOTE — Discharge Instructions (Signed)
Chest x-ray consistent with pneumonia. Take the Levaquin for the next 7 days. Make an appointment to followup with on-call next week for recheck. Return for any newer worse symptoms. Work note provided.

## 2014-05-23 NOTE — ED Notes (Addendum)
Pt reports runny nose,cough,congestion since Sunday and reports broke out in a sweat and felt very weak this am. Pt denies any chest pain,sob, n/v. Pt alert and oriented. nad noted.

## 2014-05-23 NOTE — ED Provider Notes (Signed)
CSN: [id number]     Arrival date & time 05/23/14  0714 History  This chart was scribed for Tina Sorrow, MD by Jeanell Sparrow, ED Scribe. This patient was seen in room APA11/AP[facility]11 and the patient's care was started at 7:34 AM.   Chief Complaint  Patient presents with  . Fatigue   Patient is a 53 y.o. female presenting with cough. The history is provided by the patient. No language interpreter was used.  Cough Cough characteristics:  Non-productive Severity:  Moderate Onset quality:  Sudden Timing:  Intermittent Progression:  Unchanged Chronicity:  New Relieved by:  None tried Worsened by:  Nothing tried Ineffective treatments:  None tried Associated symptoms: chills, diaphoresis, ear pain, headaches, shortness of breath, sore throat and wheezing   Associated symptoms: no chest pain, no fever and no rash    HPI Comments: [name] is a 53 y.o. female who presents to the Emergency Department complaining of fatigue that started 3 days ago. She reports that she woke up diaphoretic with chills in the morning. She also states that she had a nonproductive cough as well. She described the feeling "as if my ear closed up." She saw her PCP, Dr. Nevada Crane, 2 days ago.  Past Medical History  Diagnosis Date  . Hypertension   . Diabetes mellitus    Past Surgical History  Procedure Laterality Date  . Abdominal hysterectomy    . Cholecystectomy    . Cesarean section    . Sinus exploration    . Carpal tunnel release    . Wrist surgery    . Trigger finger release     Family History  Problem Relation Age of Onset  . Hypertension Mother   . Diabetes Mother    History  Substance Use Topics  . Smoking status: Never Smoker   . Smokeless tobacco: Not on file  . Alcohol Use: No   OB History   Grav Para Term Preterm Abortions TAB SAB Ect Mult Living                 Review of Systems  Constitutional: Positive for chills, diaphoresis and fatigue. Negative for fever.  HENT: Positive  for congestion, ear pain and sore throat.   Eyes: Negative for visual disturbance.  Respiratory: Positive for cough, shortness of breath and wheezing.   Cardiovascular: Negative for chest pain.  Gastrointestinal: Negative for nausea, vomiting and diarrhea.  Genitourinary: Negative for dysuria.  Musculoskeletal: Negative for back pain and neck pain.  Skin: Negative for rash.  Neurological: Positive for headaches. Negative for dizziness.  Psychiatric/Behavioral: Negative for confusion.  All other systems reviewed and are negative.     Allergies  Barium-containing compounds and Sulfa antibiotics  Home Medications   Prior to Admission medications   Medication Sig Start Date End Date Taking? Authorizing Provider  acetaminophen (TYLENOL) 500 MG tablet Take 1,000 mg by mouth at bedtime as needed. For pain   Yes Historical Provider, MD  amLODipine (NORVASC) 10 MG tablet Take 10 mg by mouth daily.   Yes Historical Provider, MD  amoxicillin-clavulanate (AUGMENTIN) 875-125 MG per tablet Take 1 tablet by mouth 2 (two) times daily. For 10 days, starting 05/22/2014 05/22/14  Yes Historical Provider, MD  aspirin EC 81 MG tablet Take 81 mg by mouth daily.   Yes Historical Provider, MD  estrogens, conjugated, (PREMARIN) 0.625 MG tablet Take 0.625 mg by mouth daily. Take daily for 21 days then do not take for 7 days.   Yes Historical Provider,  MD  LOSARTAN POTASSIUM PO Take by mouth.   Yes Historical Provider, MD  METFORMIN HCL PO Take by mouth 2 (two) times daily.   Yes Historical Provider, MD  metoprolol (LOPRESSOR) 50 MG tablet Take 50 mg by mouth 2 (two) times daily.   Yes Historical Provider, MD  Pioglitazone HCl (ACTOS PO) Take by mouth.   Yes Historical Provider, MD  fluticasone (FLONASE) 50 MCG/ACT nasal spray Place 2 sprays into the nose daily.    Historical Provider, MD  levofloxacin (LEVAQUIN) 750 MG tablet Take 1 tablet (750 mg total) by mouth daily. 05/23/14   Tina Sorrow, MD  promethazine  (PHENERGAN) 25 MG tablet Take 1 tablet (25 mg total) by mouth every 6 (six) hours as needed for nausea. 03/17/12 03/24/12  [name], MD   BP 127/68  Pulse 71  Temp(Src) 97.9 F (36.6 C) (Oral)  Resp 18  Ht 5\' 5"  (1.651 m)  Wt 242 lb (109.77 kg)  BMI 40.27 kg/m2  SpO2 98% Physical Exam  Nursing note and vitals reviewed. Constitutional: She is oriented to person, place, and time. She appears well-developed and well-nourished. No distress.  HENT:  Head: Normocephalic and atraumatic.  Eyes: EOM are normal.  Neck: Neck supple. No tracheal deviation present.  Cardiovascular: Normal rate and regular rhythm.   No murmur heard. Pulmonary/Chest: Effort normal. No respiratory distress.  Abdominal: Soft. Bowel sounds are normal. There is no tenderness.  Musculoskeletal: Normal range of motion. She exhibits no edema.  Neurological: She is alert and oriented to person, place, and time. No cranial nerve deficit. Coordination normal.  Skin: Skin is warm and dry.  Psychiatric: She has a normal mood and affect. Her behavior is normal.    ED Course  Procedures (including critical care time) DIAGNOSTIC STUDIES: Oxygen Saturation is 98% on RA, normal by my interpretation.    COORDINATION OF CARE: 7:39 AM- Pt advised of plan for treatment which includes radiology, labs, and an EKG and pt agrees.  Results for orders placed during the hospital encounter of 05/23/14  CBC WITH DIFFERENTIAL      Result Value Ref Range   WBC 10.4  4.0 - 10.5 K/uL   RBC 4.07  3.87 - 5.11 MIL/uL   Hemoglobin 12.2  12.0 - 15.0 g/dL   HCT 35.2 (*) 36.0 - 46.0 %   MCV 86.5  78.0 - 100.0 fL   MCH 30.0  26.0 - 34.0 pg   MCHC 34.7  30.0 - 36.0 g/dL   RDW 13.9  11.5 - 15.5 %   Platelets 253  150 - 400 K/uL   Neutrophils Relative % 63  43 - 77 %   Neutro Abs 6.6  1.7 - 7.7 K/uL   Lymphocytes Relative 24  12 - 46 %   Lymphs Abs 2.5  0.7 - 4.0 K/uL   Monocytes Relative 10  3 - 12 %   Monocytes Absolute 1.0  0.1 -  1.0 K/uL   Eosinophils Relative 3  0 - 5 %   Eosinophils Absolute 0.3  0.0 - 0.7 K/uL   Basophils Relative 0  0 - 1 %   Basophils Absolute 0.0  0.0 - 0.1 K/uL  COMPREHENSIVE METABOLIC PANEL      Result Value Ref Range   Sodium 138  137 - 147 mEq/L   Potassium 3.6 (*) 3.7 - 5.3 mEq/L   Chloride 98  96 - 112 mEq/L   CO2 26  19 - 32 mEq/L   Glucose,  Bld 149 (*) 70 - 99 mg/dL   BUN 16  6 - 23 mg/dL   Creatinine, Ser 0.50  0.50 - 1.10 mg/dL   Calcium 8.9  8.4 - 10.5 mg/dL   Total Protein 7.2  6.0 - 8.3 g/dL   Albumin 3.3 (*) 3.5 - 5.2 g/dL   AST 11  0 - 37 U/L   ALT 9  0 - 35 U/L   Alkaline Phosphatase 50  39 - 117 U/L   Total Bilirubin <0.2 (*) 0.3 - 1.2 mg/dL   GFR calc non Af Amer >90  >90 mL/min   GFR calc Af Amer >90  >90 mL/min   No results found.  Medications  levofloxacin (LEVAQUIN) tablet 750 mg (not administered)     Labs Review Labs Reviewed  CBC WITH DIFFERENTIAL - Abnormal; Notable for the following:    HCT 35.2 (*)    All other components within normal limits  COMPREHENSIVE METABOLIC PANEL - Abnormal; Notable for the following:    Potassium 3.6 (*)    Glucose, Bld 149 (*)    Albumin 3.3 (*)    Total Bilirubin <0.2 (*)    All other components within normal limits    Imaging Review Dg Chest 2 View  05/23/2014   CLINICAL DATA:  Upper respiratory symptoms  EXAM: CHEST  2 VIEW  COMPARISON:  PA and lateral chest x-ray of May 15, 2011  FINDINGS: The lungs are adequately inflated. Increased interstitial lung markings are present in the right infrahilar region likely in the anterior aspect of the lower lobe. The cardiac silhouette is top-normal in size. The pulmonary vascularity is not engorged. The mediastinum is normal in width. There is mild degenerative disc change at multiple thoracic levels.  IMPRESSION: New increased density in the anterior aspect of the right lower lobe is consistent with pneumonia.   Electronically Signed   By: David  Martinique   On: 05/23/2014  08:10     EKG Interpretation   Date/Time:  Wednesday May 23 2014 07:56:23 EDT Ventricular Rate:  70 PR Interval:  192 QRS Duration: 101 QT Interval:  428 QTC Calculation: 462 R Axis:   84 Text Interpretation:  Sinus rhythm Nonspecific ST abnormality Confirmed by  Ismeal Heider  MD, Lavere Shinsky ([id number]) on 05/23/2014 7:59:33 AM      MDM   Final diagnoses:  Community acquired pneumonia     And chest x-ray consistent with pneumonia this would be a community-acquired pneumonia due to her diabetes we'll treat this with Levaquin. Patient to take that medication for 7 days. Patient nontoxic no acute distress. No hypoxia room air sats 98%. Patient also with some mild hypokalemia. Patient is on potassium supplements already. Patient will followup with her record Dr. next week for recheck.    I personally performed the services described in this documentation, which was scribed in my presence. The recorded information has been reviewed and is accurate.      Tina Sorrow, MD 05/23/14 [phone]

## 2014-08-25 ENCOUNTER — Other Ambulatory Visit (HOSPITAL_COMMUNITY): Payer: Self-pay | Admitting: Emergency Medicine

## 2014-08-25 ENCOUNTER — Ambulatory Visit (HOSPITAL_COMMUNITY)
Admission: RE | Admit: 2014-08-25 | Discharge: 2014-08-25 | Disposition: A | Discharge status: Home / Self Care | Payer: 59 | Source: Ambulatory Visit | Attending: Emergency Medicine | Admitting: Emergency Medicine

## 2014-08-25 DIAGNOSIS — R062 Wheezing: Secondary | ICD-10-CM | POA: Diagnosis present

## 2014-08-25 DIAGNOSIS — R0989 Other specified symptoms and signs involving the circulatory and respiratory systems: Secondary | ICD-10-CM | POA: Diagnosis not present

## 2014-08-25 DIAGNOSIS — R509 Fever, unspecified: Secondary | ICD-10-CM | POA: Diagnosis not present

## 2014-08-26 ENCOUNTER — Emergency Department (HOSPITAL_COMMUNITY)
Admission: EM | Admit: 2014-08-26 | Discharge: 2014-08-26 | Disposition: A | Discharge status: Home / Self Care | Payer: 59 | Attending: Emergency Medicine | Admitting: Emergency Medicine

## 2014-08-26 ENCOUNTER — Encounter (HOSPITAL_COMMUNITY): Payer: Self-pay | Admitting: Emergency Medicine

## 2014-08-26 DIAGNOSIS — IMO0002 Reserved for concepts with insufficient information to code with codable children: Secondary | ICD-10-CM | POA: Insufficient documentation

## 2014-08-26 DIAGNOSIS — E119 Type 2 diabetes mellitus without complications: Secondary | ICD-10-CM | POA: Diagnosis not present

## 2014-08-26 DIAGNOSIS — Z79899 Other long term (current) drug therapy: Secondary | ICD-10-CM | POA: Diagnosis not present

## 2014-08-26 DIAGNOSIS — I10 Essential (primary) hypertension: Secondary | ICD-10-CM | POA: Insufficient documentation

## 2014-08-26 DIAGNOSIS — Z792 Long term (current) use of antibiotics: Secondary | ICD-10-CM | POA: Diagnosis not present

## 2014-08-26 DIAGNOSIS — R079 Chest pain, unspecified: Secondary | ICD-10-CM | POA: Insufficient documentation

## 2014-08-26 LAB — BASIC METABOLIC PANEL
Anion gap: 12 (ref 5–15)
BUN: 10 mg/dL (ref 6–23)
CO2: 28 mEq/L (ref 19–32)
Calcium: 9.1 mg/dL (ref 8.4–10.5)
Chloride: 98 mEq/L (ref 96–112)
Creatinine, Ser: 0.47 mg/dL — ABNORMAL LOW (ref 0.50–1.10)
GFR calc Af Amer: >90 mL/min (ref >90)
GFR calc non Af Amer: >90 mL/min (ref >90)
Glucose, Bld: 139 mg/dL — ABNORMAL HIGH (ref 70–99)
Potassium: 3.3 mEq/L — ABNORMAL LOW (ref 3.7–5.3)
Sodium: 138 mEq/L (ref 137–147)

## 2014-08-26 LAB — CBC WITH DIFFERENTIAL/PLATELET
Basophils Absolute: 0 10*3/uL (ref 0.0–0.1)
Basophils Relative: 0 % (ref 0–1)
Eosinophils Absolute: 0.4 10*3/uL (ref 0.0–0.7)
Eosinophils Relative: 5 % (ref 0–5)
HCT: 36.1 % (ref 36.0–46.0)
Hemoglobin: 12.4 g/dL (ref 12.0–15.0)
Lymphocytes Relative: 37 % (ref 12–46)
Lymphs Abs: 2.7 10*3/uL (ref 0.7–4.0)
MCH: 30.1 pg (ref 26.0–34.0)
MCHC: 34.3 g/dL (ref 30.0–36.0)
MCV: 87.6 fL (ref 78.0–100.0)
Monocytes Absolute: 0.7 10*3/uL (ref 0.1–1.0)
Monocytes Relative: 9 % (ref 3–12)
Neutro Abs: 3.4 10*3/uL (ref 1.7–7.7)
Neutrophils Relative %: 49 % (ref 43–77)
Platelets: 253 10*3/uL (ref 150–400)
RBC: 4.12 MIL/uL (ref 3.87–5.11)
RDW: 13.8 % (ref 11.5–15.5)
WBC: 7.2 10*3/uL (ref 4.0–10.5)

## 2014-08-26 LAB — URINALYSIS, ROUTINE W REFLEX MICROSCOPIC
Bilirubin Urine: NEGATIVE
Glucose, UA: NEGATIVE mg/dL
Hgb urine dipstick: NEGATIVE
Ketones, ur: NEGATIVE mg/dL
Leukocytes, UA: NEGATIVE
Nitrite: NEGATIVE
Protein, ur: NEGATIVE mg/dL
Specific Gravity, Urine: <1.005 — ABNORMAL LOW (ref 1.005–1.030)
Urobilinogen, UA: 0.2 mg/dL (ref 0.0–1.0)
pH: 7 (ref 5.0–8.0)

## 2014-08-26 LAB — TROPONIN I: Troponin I: <0.3 ng/mL (ref <0.30)

## 2014-08-26 NOTE — ED Notes (Signed)
MD at bedside. 

## 2014-08-26 NOTE — ED Provider Notes (Signed)
CSN: [id number]     Arrival date & time 08/26/14  3419 History   First MD Initiated Contact with Patient 08/26/14 0700     Chief Complaint  Patient presents with  . Chest Pain     (Consider location/radiation/quality/duration/timing/severity/associated sxs/prior Treatment) HPI  53yF with chest tightness. Onset yesterday. Cannot remember what specifically was doing when first noticed. Constant since. Does not radiate. Feels generally weak. No SOB. No unusual leg pain or swelling. No fever or chills.   Past Medical History  Diagnosis Date  . Hypertension   . Diabetes mellitus    Past Surgical History  Procedure Laterality Date  . Abdominal hysterectomy    . Cholecystectomy    . Cesarean section    . Sinus exploration    . Carpal tunnel release    . Wrist surgery    . Trigger finger release     Family History  Problem Relation Age of Onset  . Hypertension Mother   . Diabetes Mother    History  Substance Use Topics  . Smoking status: Never Smoker   . Smokeless tobacco: Not on file  . Alcohol Use: No   OB History   Grav Para Term Preterm Abortions TAB SAB Ect Mult Living                 Review of Systems  All systems reviewed and negative, other than as noted in HPI.   Allergies  Barium-containing compounds and Sulfa antibiotics  Home Medications   Prior to Admission medications   Medication Sig Start Date End Date Taking? Authorizing Provider  acetaminophen (TYLENOL) 500 MG tablet Take 1,000 mg by mouth at bedtime as needed. For pain    Historical Provider, MD  amLODipine (NORVASC) 10 MG tablet Take 10 mg by mouth daily.    Historical Provider, MD  amoxicillin-clavulanate (AUGMENTIN) 875-125 MG per tablet Take 1 tablet by mouth 2 (two) times daily. For 10 days, starting 05/22/2014 05/22/14   Historical Provider, MD  aspirin EC 81 MG tablet Take 81 mg by mouth daily.    Historical Provider, MD  estrogens, conjugated, (PREMARIN) 0.625 MG tablet Take 0.625 mg by  mouth daily. Take daily for 21 days then do not take for 7 days.    Historical Provider, MD  fluticasone (FLONASE) 50 MCG/ACT nasal spray Place 2 sprays into the nose daily.    Historical Provider, MD  levofloxacin (LEVAQUIN) 750 MG tablet Take 1 tablet (750 mg total) by mouth daily. 05/23/14   Fredia Sorrow, MD  LOSARTAN POTASSIUM PO Take by mouth.    Historical Provider, MD  METFORMIN HCL PO Take by mouth 2 (two) times daily.    Historical Provider, MD  metoprolol (LOPRESSOR) 50 MG tablet Take 50 mg by mouth 2 (two) times daily.    Historical Provider, MD  Pioglitazone HCl (ACTOS PO) Take by mouth.    Historical Provider, MD  promethazine (PHENERGAN) 25 MG tablet Take 1 tablet (25 mg total) by mouth every 6 (six) hours as needed for nausea. 03/17/12 03/24/12  [name], MD   BP 154/87  Pulse 79  Temp(Src) 97.5 F (36.4 C) (Oral)  Resp 16  Ht 5\' 3"  (1.6 m)  Wt 245 lb (111.131 kg)  BMI 43.41 kg/m2  SpO2 100% Physical Exam  Nursing note and vitals reviewed. Constitutional: She appears well-developed and well-nourished. No distress.  HENT:  Head: Normocephalic and atraumatic.  Eyes: Conjunctivae are normal. Right eye exhibits no discharge. Left eye exhibits no  discharge.  Neck: Neck supple.  Cardiovascular: Normal rate, regular rhythm and normal heart sounds.  Exam reveals no gallop and no friction rub.   No murmur heard. Pulmonary/Chest: Effort normal and breath sounds normal. No respiratory distress.  Abdominal: Soft. She exhibits no distension. There is no tenderness.  Musculoskeletal: She exhibits no edema and no tenderness.  Lower extremities symmetric as compared to each other. No calf tenderness. Negative Homan's. No palpable cords.   Neurological: She is alert.  Skin: Skin is warm and dry.  Psychiatric: She has a normal mood and affect. Her behavior is normal. Thought content normal.    ED Course  Procedures (including critical care time) Labs Review Labs Reviewed   BASIC METABOLIC PANEL - Abnormal; Notable for the following:    Potassium 3.3 (*)    Glucose, Bld 139 (*)    Creatinine, Ser 0.47 (*)    All other components within normal limits  CBC WITH DIFFERENTIAL  TROPONIN I    Imaging Review Dg Chest 2 View  08/25/2014   CLINICAL DATA:  Chest congestion fever wheezing  EXAM: CHEST  2 VIEW  COMPARISON:  05/23/2014  FINDINGS: Heart size mildly enlarged but stable. Vascular pattern within normal limits. No consolidation or effusion.  IMPRESSION: No active cardiopulmonary disease.   Electronically Signed   By: Skipper Cliche M.D.   On: 08/25/2014 13:09     EKG Interpretation   Date/Time:  Sunday August 26 2014 07:04:42 EDT Ventricular Rate:  78 PR Interval:  195 QRS Duration: 103 QT Interval:  415 QTC Calculation: 473 R Axis:   80 Text Interpretation:  Sinus rhythm No significant change since last  tracing Confirmed by Advika Mclelland  MD, [place] ([id number]) on 08/26/2014 7:58:09 AM      MDM   Final diagnoses:  Chest pain, unspecified chest pain type    53yF with CP. Atypical for ACS. Doubt PE, SBI, dissection or other emergent pathology. Outpt FU.     Virgel Manifold, MD 09/04/14 [phone]

## 2014-08-26 NOTE — ED Notes (Signed)
Pt c/o chest soreness/tightness since yesterday. Pt had chest xray yesterday that was negative. Pt also reports some weakness/nausea this am. nad noted.

## 2014-08-26 NOTE — ED Notes (Signed)
Patient with no complaints at this time. Respirations even and unlabored. Skin warm/dry. Discharge instructions reviewed with patient at this time. Patient given opportunity to voice concerns/ask questions. IV removed per policy and band-aid applied to site. Patient discharged at this time and left Emergency Department with steady gait.  

## 2015-01-01 ENCOUNTER — Ambulatory Visit: Payer: 59 | Admitting: Internal Medicine

## 2015-03-19 ENCOUNTER — Other Ambulatory Visit (HOSPITAL_COMMUNITY)
Admission: RE | Admit: 2015-03-19 | Discharge: 2015-03-19 | Disposition: A | Discharge status: Home / Self Care | Payer: 59 | Source: Ambulatory Visit | Attending: Family Medicine | Admitting: Family Medicine

## 2015-03-19 DIAGNOSIS — E784 Other hyperlipidemia: Secondary | ICD-10-CM | POA: Insufficient documentation

## 2015-03-19 DIAGNOSIS — R5383 Other fatigue: Secondary | ICD-10-CM | POA: Insufficient documentation

## 2015-03-19 DIAGNOSIS — D510 Vitamin B12 deficiency anemia due to intrinsic factor deficiency: Secondary | ICD-10-CM | POA: Diagnosis not present

## 2015-03-19 DIAGNOSIS — I110 Hypertensive heart disease with heart failure: Secondary | ICD-10-CM | POA: Insufficient documentation

## 2015-03-19 DIAGNOSIS — E1165 Type 2 diabetes mellitus with hyperglycemia: Secondary | ICD-10-CM | POA: Insufficient documentation

## 2015-03-19 LAB — LIPID PANEL
CHOL/HDL RATIO: 3.1 ratio
Cholesterol: 191 mg/dL (ref 0–200)
HDL: 62 mg/dL (ref >39)
LDL Cholesterol: 105 mg/dL — ABNORMAL HIGH (ref 0–99)
TRIGLYCERIDES: 122 mg/dL (ref <150)
VLDL: 24 mg/dL (ref 0–40)

## 2015-03-19 LAB — COMPREHENSIVE METABOLIC PANEL
ALBUMIN: 3.5 g/dL (ref 3.5–5.2)
ALT: 12 U/L (ref 0–35)
AST: 18 U/L (ref 0–37)
Alkaline Phosphatase: 47 U/L (ref 39–117)
Anion gap: 8 (ref 5–15)
BUN: 11 mg/dL (ref 6–23)
CALCIUM: 8.8 mg/dL (ref 8.4–10.5)
CHLORIDE: 107 mmol/L (ref 96–112)
CO2: 25 mmol/L (ref 19–32)
CREATININE: 0.5 mg/dL (ref 0.50–1.10)
GFR calc Af Amer: >90 mL/min (ref >90)
Glucose, Bld: 128 mg/dL — ABNORMAL HIGH (ref 70–99)
Potassium: 3.7 mmol/L (ref 3.5–5.1)
Sodium: 140 mmol/L (ref 135–145)
Total Bilirubin: 0.4 mg/dL (ref 0.3–1.2)
Total Protein: 6.9 g/dL (ref 6.0–8.3)

## 2015-03-19 LAB — CBC WITH DIFFERENTIAL/PLATELET
Basophils Absolute: 0 10*3/uL (ref 0.0–0.1)
Basophils Relative: 0 % (ref 0–1)
EOS ABS: 0.3 10*3/uL (ref 0.0–0.7)
Eosinophils Relative: 5 % (ref 0–5)
HCT: 35.3 % — ABNORMAL LOW (ref 36.0–46.0)
Hemoglobin: 11.8 g/dL — ABNORMAL LOW (ref 12.0–15.0)
LYMPHS ABS: 2.6 10*3/uL (ref 0.7–4.0)
Lymphocytes Relative: 40 % (ref 12–46)
MCH: 29.4 pg (ref 26.0–34.0)
MCHC: 33.4 g/dL (ref 30.0–36.0)
MCV: 88 fL (ref 78.0–100.0)
MONOS PCT: 9 % (ref 3–12)
Monocytes Absolute: 0.6 10*3/uL (ref 0.1–1.0)
NEUTROS PCT: 46 % (ref 43–77)
Neutro Abs: 3.1 10*3/uL (ref 1.7–7.7)
Platelets: 279 10*3/uL (ref 150–400)
RBC: 4.01 MIL/uL (ref 3.87–5.11)
RDW: 14.1 % (ref 11.5–15.5)
WBC: 6.6 10*3/uL (ref 4.0–10.5)

## 2015-03-19 LAB — TSH: TSH: 0.236 u[IU]/mL — ABNORMAL LOW (ref 0.350–4.500)

## 2015-03-20 LAB — HEMOGLOBIN A1C
HEMOGLOBIN A1C: 7.4 % — AB (ref 4.8–5.6)
MEAN PLASMA GLUCOSE: 166 mg/dL

## 2015-03-20 LAB — T4: T4 TOTAL: 11 ug/dL (ref 4.5–12.0)

## 2015-03-28 ENCOUNTER — Ambulatory Visit: Payer: 59 | Admitting: Internal Medicine

## 2015-04-26 ENCOUNTER — Encounter: Payer: 59 | Attending: Family Medicine | Admitting: Nutrition

## 2015-04-26 ENCOUNTER — Encounter: Payer: Self-pay | Admitting: Nutrition

## 2015-04-26 VITALS — Ht 63.0 in | Wt 253.0 lb

## 2015-04-26 DIAGNOSIS — Z6841 Body Mass Index (BMI) 40.0 and over, adult: Secondary | ICD-10-CM | POA: Insufficient documentation

## 2015-04-26 DIAGNOSIS — Z713 Dietary counseling and surveillance: Secondary | ICD-10-CM | POA: Diagnosis not present

## 2015-04-26 DIAGNOSIS — E1165 Type 2 diabetes mellitus with hyperglycemia: Secondary | ICD-10-CM

## 2015-04-26 DIAGNOSIS — E669 Obesity, unspecified: Secondary | ICD-10-CM | POA: Diagnosis not present

## 2015-04-26 DIAGNOSIS — E118 Type 2 diabetes mellitus with unspecified complications: Secondary | ICD-10-CM | POA: Insufficient documentation

## 2015-04-26 DIAGNOSIS — IMO0002 Reserved for concepts with insufficient information to code with codable children: Secondary | ICD-10-CM

## 2015-04-26 NOTE — Progress Notes (Signed)
  Medical Nutrition Therapy:  Appt start time: 1130 end time:  1230. Assessment:  Primary concerns today: DIabetes.  LIves with by herself but her daugher and mom. Most recent 7.4%. Metformin 500 mg BID. Januvia 100 mg daily  Testing blood sugar once a day but rotates. Has been trying to cut back on portion and watching what she eats. Not exercising a lot . Wants to lose weight and improve blood sugars to get off of some medications for DM. Didn't bring blood sugar log.    Diet is inconsistent with meals and not meeting her nutritional needs. Needs more fresh fruits, whole grains, baked and broiled low carb vegetables. Does drink water. Diet high in processed foods and refined carbohydrates.   Preferred Learning Style:  Auditory  Visual  Hands on  Learning Readiness:  Not ready  Contemplating  Ready  Change in progress   MEDICATIONS: See list   DIETARY INTAKE:    24-hr recall:  B ( AM): 2 eggs, 2 slices bacon and 1 slice toast, water Snk ( AM): none L ( PM): skipped: usually skips Snk ( PM): none D ( PM): Hamburger with bun and string fries, fried pickles. water Snk ( PM): cookie-snickerdoodles Beverages: water  Usual physical activity: ADL but does some walking on her job.  Estimated energy needs: 1500 calories 170 g carbohydrates 112 g protein 42 g fat  Progress Towards Goal(s):  Some progress.   Nutritional Diagnosis:  NB-1.1 Food and nutrition-related knowledge deficit As related to Diabetes.  As evidenced by A1C 7.4%..    Intervention:  Nutrition counseling and diabetes education provided on disease, Carb counting, meal planning, MY Plate, portion sizes, low fat low sodium high fiber diet, benefits of exercise, signs/symptoms and treatment of hyper/hypoglycemial and prevention of complications and testing and using results..  Goal: 1. Follow Plate Method as discussed. 2. Increase fresh fruits and vegetables. 3. Drink water with meals. 4. Do not skip  meals. 5. Test bloods sugars fasting and record on BS log. 6. Exercise 30 minutes 5 days per week. 7. Lose 1- l2 lbs per week 8. Get A1C down below 7% in three months.   Teaching Method Utilized:  Visual Auditory Hands on  Handouts given during visit include:  The Plate Method  The Meal Plan Card  Barriers to learning/adherence to lifestyle change: None  Demonstrated degree of understanding via:  Teach Back   Monitoring/Evaluation:  Dietary intake, exercise, meal planning , and body weight in 1 month(s).

## 2015-05-08 NOTE — Patient Instructions (Signed)
Goal: 1. Follow Plate Method as discussed. 2. Increase fresh fruits and vegetables. 3. Drink water with meals. 4. Do not skip meals. 5. Test bloods sugars fasting and record on BS log. 6. Exercise 30 minutes 5 days per week. 7. Lose 1- l2 lbs per week 8. Get A1C down below 7% in three months.

## 2015-06-04 ENCOUNTER — Other Ambulatory Visit (HOSPITAL_COMMUNITY)
Admission: RE | Admit: 2015-06-04 | Discharge: 2015-06-04 | Disposition: A | Discharge status: Home / Self Care | Payer: 59 | Source: Ambulatory Visit | Attending: Family Medicine | Admitting: Family Medicine

## 2015-06-04 DIAGNOSIS — E1165 Type 2 diabetes mellitus with hyperglycemia: Secondary | ICD-10-CM | POA: Diagnosis not present

## 2015-06-04 DIAGNOSIS — E784 Other hyperlipidemia: Secondary | ICD-10-CM | POA: Diagnosis present

## 2015-06-04 LAB — LIPID PANEL
CHOL/HDL RATIO: 2.8 ratio
Cholesterol: 173 mg/dL (ref 0–200)
HDL: 61 mg/dL (ref >40)
LDL Cholesterol: 85 mg/dL (ref 0–99)
TRIGLYCERIDES: 133 mg/dL (ref <150)
VLDL: 27 mg/dL (ref 0–40)

## 2015-06-06 LAB — HEMOGLOBIN A1C
Hgb A1c MFr Bld: 7.8 % — ABNORMAL HIGH (ref 4.8–5.6)
Mean Plasma Glucose: 177 mg/dL

## 2015-06-10 ENCOUNTER — Other Ambulatory Visit: Payer: Self-pay

## 2015-06-10 ENCOUNTER — Encounter: Payer: Self-pay | Admitting: *Deleted

## 2015-06-10 ENCOUNTER — Other Ambulatory Visit: Payer: Self-pay | Admitting: *Deleted

## 2015-06-10 NOTE — Patient Outreach (Addendum)
[place] [facility]) Care Management   06/10/2015  [name] 1961/07/10 [id number]  LIA VIGILANTE is an 54 y.o. female presents to enroll in the Link To Wellness program for assistance with chronic disease management, specifically Type II DM, HTN and hyperlipidemia.  Subjective:  [name] says she was referred to the program by the OP pharmacist. Says she is doing well. She recently saw her primary care MD and her A1C was 7.4%. States her hypoglycemia threshold is <70.  Objective:   Review of Systems  Constitutional: Negative.     Physical Exam  Constitutional: She is oriented to person, place, and time. She appears well-developed and well-nourished.  Neurological: She is alert and oriented to person, place, and time.  Psychiatric: She has a normal mood and affect. Her behavior is normal. Judgment and thought content normal.   Filed Vitals:   06/10/15 1357  BP: 115/74   Filed Weights   06/10/15 1357  Weight: 252 lb (114.306 kg)  a dn Link  Current Medications:   Current Outpatient Prescriptions  Medication Sig Dispense Refill  . acetaminophen (TYLENOL) 500 MG tablet Take 1,000 mg by mouth at bedtime as needed. For pain    . ALPRAZolam (XANAX) 0.5 MG tablet Take 0.5 mg by mouth at bedtime as needed for anxiety.    Marland Kitchen amLODipine (NORVASC) 10 MG tablet Take 10 mg by mouth daily.    Marland Kitchen aspirin EC 81 MG tablet Take 81 mg by mouth daily.    . Azelastine-Fluticasone 137-50 MCG/ACT SUSP Place into the nose.    . beclomethasone (QVAR) 40 MCG/ACT inhaler Inhale 1 puff into the lungs 2 (two) times daily.    Marland Kitchen estrogens, conjugated, (PREMARIN) 0.625 MG tablet Take 0.625 mg by mouth daily. Take daily for 21 days then do not take for 7 days.    . fexofenadine (ALLEGRA) 180 MG tablet Take 180 mg by mouth daily.    . fluticasone (FLONASE) 50 MCG/ACT nasal spray Place 2 sprays into the nose daily.    Marland Kitchen LOSARTAN POTASSIUM PO Take by mouth.    . METFORMIN HCL PO Take by mouth 2  (two) times daily.    . metoprolol (LOPRESSOR) 50 MG tablet Take 50 mg by mouth 2 (two) times daily.    . montelukast (SINGULAIR) 10 MG tablet Take 10 mg by mouth at bedtime.    . ranitidine (ZANTAC) 150 MG tablet Take 150 mg by mouth 2 (two) times daily.    . sitaGLIPtin (JANUVIA) 100 MG tablet Take 100 mg by mouth daily.    . valsartan-hydrochlorothiazide (DIOVAN-HCT) 320-25 MG per tablet Take 1 tablet by mouth daily.    Marland Kitchen amoxicillin-clavulanate (AUGMENTIN) 875-125 MG per tablet Take 1 tablet by mouth 2 (two) times daily. For 10 days, starting 05/22/2014    . levofloxacin (LEVAQUIN) 750 MG tablet Take 1 tablet (750 mg total) by mouth daily. (Patient not taking: Reported on 04/26/2015) 7 tablet 0  . Pioglitazone HCl (ACTOS PO) Take by mouth.    . promethazine (PHENERGAN) 25 MG tablet Take 1 tablet (25 mg total) by mouth every 6 (six) hours as needed for nausea. 30 tablet 0   No current facility-administered medications for this visit.    Functional Status:   In your present state of health, do you have any difficulty performing the following activities: 06/10/2015  Hearing? N  Vision? N  Difficulty concentrating or making decisions? N  Walking or climbing stairs? N  Dressing or bathing? N  Doing  errands, shopping? N    Fall/Depression Screening:    PHQ 2/9 Scores 06/10/2015 04/26/2015  PHQ - 2 Score 0 0    THN CM Care Plan Problem One        Patient Outreach from 06/10/2015 in [place] Problem One  Type II DM not meeting target A1C, HTN meeting target BPs, hyperlipdemia- intolerate to statins   Care Plan for Problem One  Active   THN Long Term Goal (31-90 days)  Improved glycemic control as evidenced by improved A1C (<7.4%) and blood sugars meeting pre and post meal target >75% of the time, continued good control of HTN as evidenced by BP meeting treatment targets of <140/<90 and improvement in lipid profile at next assessment   [facility] Long Term Goal Start Date   06/11/15   Interventions for Problem One Long Term Goal  Discussed Link to Wellness program goals, requirements and benefits, reviewed member's rights and responsibilities ,provided diabetes information packet with explanation of contents, ensured member agreed and signed :consent to participate and authorization to release and receive health information, participation agreement and consent to enroll in program,  assessed member's current knowledge of diabetes, then using a picture representation, discussed the 8 core pathophysiologic deficits in Type II diabetes. Discussed physiology of diabetes as a chronic progressive disease with the initial problem of insulin resistance in the muscle, liver and fat cells and then increased loss of beta cell function over time resulting in decreased insulin production, discussed role of obesity, especially abdominal (visceral) obesity, on insulin resistance, reviewed patient medications, discussed DM medications of Metformin and Januvia including the mechanism of action, common side effects, dosages and dosing schedule, reinforced importance of taking all medications as prescribed, discussed the need for the use of a combination of DM medications to correct the pathophysiologic core deficits and to  prevent or slow beta cell failure, discussed role of aspirin, and blood pressure medicines in the treatment of diabetes, issued voucher for purchase of home BP monitor for $5 and assigned EMMI education module related to self monitoring BP,  reviewed nutritional counseling benefit provided by [facility] and encouraged patient to see dietician again to assist with ongoing dietary management of diabetes,  discussed effects of physical activity on glucose levels and long-term glucose control by improving insulin sensitivity and  assisting with weight management and cardiovascular health, discussed exercise opportunities offered by [facility]. encouraged patient to  resume an  exercise program,  explained to member how to obtain a glucometer and testing supplies at no cost, reviewed patient's blood sugar readings, discussed blood glucose monitoring and interpretation, discussed recommended target ranges for pre-meal and post-meal,  provided blood sugar log sheets with targets for pre and post meal, reviewed definitions of hyperglycemia and hypoglycemia, assessed patient's hypoglycemia threshold and usual symptoms, discussed causes, other symptoms, and treatment, reviewed A1C goal, will arrange for follow up in 2-3 months      Assessment:   [employer] 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 She has Type II DM- currently not meeting A1C target, HTN - meeting treatment targets and hyperlipidemia with an intolerance to statins. She is enrolling in the [employer] To Wellness program for self management assistance.  Plan:  RNCM to fax today's office visit note to Dr. Lorriane Shire. RNCM will meet quarterly and as needed with patient per Link To Wellness program  guidelines to assist with Type II DM, HTN and hyperlipidemia self-management and assess patient's progress toward mutually set goals.  Barrington Ellison RN,CCM,CDE [place] Management Coordinator Link To Wellness Office Phone [phone] Office Fax [phone]

## 2015-07-03 ENCOUNTER — Ambulatory Visit: Payer: 59 | Admitting: Nutrition

## 2015-07-10 ENCOUNTER — Other Ambulatory Visit: Payer: Self-pay | Admitting: *Deleted

## 2015-07-10 NOTE — Patient Outreach (Signed)
Returned call to [facility] after she left a voice mail at 10:12 am. States she saw Dr. Cindie Laroche and he gave her samples of the Bydureon pen but she was not instructed on how to use it and she is requesting help. With her agreement, securely emailed her the following web sites that provide videos on how to reconstitute and administer the medication: https://www.bydureon.com/using-bydureon/how-to-use-bydureon.html http://garcia.info/ Also explained the mechanism of action of Bydureon, the dosage,  the frequency of administration and the likelihood that she will get injection site nodules but that she should report any nodules that become erythematous or painful to her MD as this would represent injection site reaction. Advised her to contact this RNCM for any further questions or concerns. Barrington Ellison RN,CCM,CDE [place] Management Coordinator Link To Wellness Office Phone [phone] Office Fax [phone]

## 2015-07-16 ENCOUNTER — Other Ambulatory Visit: Payer: Self-pay | Admitting: *Deleted

## 2015-07-16 NOTE — Patient Outreach (Signed)
Called Pam on her mobile number to ensure that she received the instructional video that was securely emailed to her on how to reconstitute and administer Bydureon. She said the video was very helpful and she feels comfortable with the procedure but she has not yet decided if she wants to start the medication. Encouraged her to call this RNCM if she has any further questions or concerns.  Barrington Ellison RN,CCM,CDE [place] Management Coordinator Link To Wellness Office Phone [phone] Office Fax [phone]

## 2015-08-05 ENCOUNTER — Ambulatory Visit: Payer: 59 | Admitting: Nutrition

## 2015-08-12 ENCOUNTER — Encounter: Payer: 59 | Attending: Family Medicine | Admitting: Nutrition

## 2015-08-12 VITALS — Ht 63.0 in | Wt 250.8 lb

## 2015-08-12 DIAGNOSIS — E1165 Type 2 diabetes mellitus with hyperglycemia: Secondary | ICD-10-CM | POA: Diagnosis not present

## 2015-08-12 DIAGNOSIS — Z6841 Body Mass Index (BMI) 40.0 and over, adult: Secondary | ICD-10-CM | POA: Insufficient documentation

## 2015-08-12 DIAGNOSIS — E118 Type 2 diabetes mellitus with unspecified complications: Secondary | ICD-10-CM

## 2015-08-12 DIAGNOSIS — K5732 Diverticulitis of large intestine without perforation or abscess without bleeding: Secondary | ICD-10-CM

## 2015-08-12 DIAGNOSIS — IMO0002 Reserved for concepts with insufficient information to code with codable children: Secondary | ICD-10-CM

## 2015-08-12 NOTE — Patient Instructions (Signed)
Goal: 1. Follow Plate Method as discussed. 2. Increase fresh fruits and vegetables. 3. Drink water with meals. 4. Cut out snacks between meals. 5. Test bloods sugars fasting and 2 hours after supper. 6. Exercise 30 minutes 3 days per week. 7. 10 lbs in the next three months. 8. Get A1C down below 7% in three months.  Follow the low fiber diet and discuss with MD about increasing to a high fiber diet due to diverticulitis.

## 2015-08-12 NOTE — Progress Notes (Signed)
  Medical Nutrition Therapy:  Appt start time: 0900 end time:  0930 Assessment:  Primary concerns today: DIabetes.  Started Provastatin--not taking it but will start.. Bydureon-not taking it due to afraid from all the side effects and stuff on internet. Reeducated on its purpose and use.[name] Has DM for 15 -20 yrs.  Has lost over 100 lbs and came off insulin about 10 years ago but is gaining her weight back. Didn't bring BS log. Been on a 17 day fasting diet recently of avoiding carbs. Most recent A1C was 7.8%.  Lost 2 lbs since last visit. A1C has increased slightly from 7.4 to 7.8%. Still skipping meals at times.   Diet is inconsistent with meals and not meeting her nutritional needs. Needs more fresh fruits, whole grains, baked and broiled low carb vegetables. Does drink water. Diet high in processed foods and refined carbohydrates. Needs to work on exercise and not skipping meals.   Preferred Learning Style:  Auditory  Visual  Hands on  Learning Readiness:  Not ready  Contemplating  Ready  Change in progress   MEDICATIONS: See list   DIETARY INTAKE:    24-hr recall:  B ( AM): 2 eggs, 1/2 c oatmeal and Snk ( AM): none L ( PM): skipped: usually skips Snk ( PM): none D ( PM): meat and some vegetables. Snk ( PM):   Beverages: water  Usual physical activity: ADL but does some walking on her job.  Estimated energy needs: 1500 calories 170 g carbohydrates 112 g protein 42 g fat  Progress Towards Goal(s):  Some progress.   Nutritional Diagnosis:  NB-1.1 Food and nutrition-related knowledge deficit As related to Diabetes.  As evidenced by A1C 7.4%..    Intervention:  Nutrition counseling and diabetes education provided on disease, Carb counting, meal planning, MY Plate, portion sizes, low fat low sodium high fiber diet, benefits of exercise, signs/symptoms and treatment of hyper/hypoglycemial and prevention of complications and testing and using results..Low fiber and high  fiber diet due to Diverticulitis.   Goal: 1. Follow Plate Method as discussed. 2. Increase fresh fruits and vegetables. 3. Drink water with meals. 4. Cut out snacks between meals. 5. Test bloods sugars fasting and 2 hours after supper. 6. Exercise 30 minutes 3 days per week. 7. 10 lbs in the next three months. 8. Get A1C down below 7% in three months.  Follow the low fiber diet and discuss with MD about increasing to a high fiber diet due to diverticulitis.  Teaching Method Utilized:  Visual Auditory Hands on  Handouts given during visit include:  The Plate Method  The Meal Plan Card  Barriers to learning/adherence to lifestyle change: None  Demonstrated degree of understanding via:  Teach Back   Monitoring/Evaluation:  Dietary intake, exercise, meal planning , and body weight in 1 month(s).

## 2015-10-21 ENCOUNTER — Other Ambulatory Visit: Payer: Self-pay | Admitting: *Deleted

## 2015-10-21 ENCOUNTER — Encounter: Payer: Self-pay | Admitting: *Deleted

## 2015-10-21 VITALS — BP 125/70 | Ht 63.0 in | Wt 243.0 lb

## 2015-10-21 DIAGNOSIS — E119 Type 2 diabetes mellitus without complications: Secondary | ICD-10-CM

## 2015-10-21 LAB — POCT GLYCOSYLATED HEMOGLOBIN (HGB A1C): Hemoglobin A1C: 6.9

## 2015-10-21 NOTE — Patient Outreach (Signed)
[place] [facility]) Care Management   10/21/2015  [name] [birth year] [id number]  [name] is an 54 y.o. female who presents to the [place] Management office for routine Link To Wellness follow up for self management assistance with Type II DM, HTN and hyperlipidemia.  Subjective:  Pam says she is doing well, checking her blood sugars twice daily and when she doesn't feel well. Denies hypoglycemia. Says she will see Dr. Cindie Laroche on 11/29/15 but would like her POC Hgb A1C checked today. She is keeping a food diary and CBG log and says she feels better since losing about 10 lbs of weight. She says she would like to weigh under 200 lbs but has not set a goal date for the weight loss. She will see Jearld Fenton RD, CDE for 3 month follow up on 11/21. She says not eating after 7PM has helped the most with her weight loss.  Objective:   Review of Systems  Constitutional: Negative.     Physical Exam  Constitutional: She is oriented to person, place, and time. She appears well-developed and well-nourished.  Neurological: She is alert and oriented to person, place, and time.  Skin: Skin is warm and dry.  Psychiatric: She has a normal mood and affect. Her behavior is normal. Judgment and thought content normal.   Filed Vitals:   10/21/15 0925  BP: 125/70   Filed Weights   10/21/15 0925  Weight: 243 lb (110.224 kg)  POC Hgb A1C= 6.9%  Current Medications:   Current Outpatient Prescriptions  Medication Sig Dispense Refill  . acetaminophen (TYLENOL) 500 MG tablet Take 1,000 mg by mouth at bedtime as needed. For pain    . ALPRAZolam (XANAX) 0.5 MG tablet Take 0.5 mg by mouth at bedtime as needed for anxiety.    Marland Kitchen amLODipine (NORVASC) 10 MG tablet Take 10 mg by mouth daily.    Marland Kitchen aspirin EC 81 MG tablet Take 81 mg by mouth daily.    Marland Kitchen estrogens, conjugated, (PREMARIN) 0.625 MG tablet Take 0.625 mg by mouth daily. Take daily for 21  days then do not take for 7 days.    . Exenatide ER (BYDUREON) 2 MG PEN Inject 2 mg into the skin once a week.    . fexofenadine (ALLEGRA) 180 MG tablet Take 180 mg by mouth daily.    Marland Kitchen METFORMIN HCL PO Take 500 mg by mouth 2 (two) times daily.     . metoprolol (LOPRESSOR) 50 MG tablet Take 50 mg by mouth 2 (two) times daily.    . montelukast (SINGULAIR) 10 MG tablet Take 10 mg by mouth at bedtime.    . ranitidine (ZANTAC) 150 MG tablet Take 150 mg by mouth 2 (two) times daily.    . sitaGLIPtin (JANUVIA) 100 MG tablet Take 100 mg by mouth daily.    . valsartan-hydrochlorothiazide (DIOVAN-HCT) 320-25 MG per tablet Take 1 tablet by mouth daily.    . Azelastine-Fluticasone 137-50 MCG/ACT SUSP Place into the nose.    . beclomethasone (QVAR) 40 MCG/ACT inhaler Inhale 1 puff into the lungs 2 (two) times daily.    . fluticasone (FLONASE) 50 MCG/ACT nasal spray Place 2 sprays into the nose daily.     No current facility-administered medications for this visit.    Functional Status:   In your present state of health, do you have any difficulty performing the following activities: 10/21/2015 06/10/2015  Hearing? N N  Vision? N N  Difficulty concentrating  or making decisions? N N  Walking or climbing stairs? N N  Dressing or bathing? N N  Doing errands, shopping? N N    Fall/Depression Screening:    PHQ 2/9 Scores 06/10/2015 04/26/2015  PHQ - 2 Score 0 0    Assessment:   [employer] employee and Link To Wellness member with HTN, Type II DM, and hyperlipidemia currently meeting treatment targets for all chronic disease states.  Plan:  [facility] CM Care Plan Problem One        Most Recent Value   Care Plan Problem One  Patient with Type II DM, HTN and hyperlipidemia, now meeting all treatment targets as evidenced by POC Hgb A1C= 6.9%, HTN meeting target BPs of <140/<90, hyperlipidemia- intolerant to statins, lipid panel on 06/04/15 with normal values    Role Documenting the Problem One  Care Management  Coordinator   Care Plan for Problem One  Active   THN Long Term Goal (31-90 days)  Ongoing good control of DM, HTN and hyperlipidemia as evidenced by consistently meeting treatment targets for all chronic disease states: A1C <7.0% with  blood sugars meeting pre and post meal target >75% of the time, continued good control of HTN as evidenced by BP meeting treatment targets of <140/<90 for all readings and normal lipid profile at next assessment   [facility] Long Term Goal Start Date  06/11/15   [facility] Long Term Goal Met Date  10/21/15   Interventions for Problem One Long Term Goal   reviewed patient medications, discussed DM medications of Metformin, Januvia and Bydureon, reinforced importance of taking all medications as prescribed, discussed role of low dose aspirin in the treatment of diabetes, commended patient on her regular follow up with the dietician and for keeping a food diary and following a CHO controlled meal plan, encouraged patient to consider exercising in the [facility] exercise room during her lunch 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, reviewed patient's blood sugar readings/glucose log, reviewed  recommended targets for pre-meal and post-meal, measured and discussed results of POC Hgb A1C and its relationship to estimated average glucose, reviewed A1C goal, commended patient on the significant improvement in her glycemic control and weight loss and discussed current strategies/behavior changes to maintain for ongoing weight loss success and improved health, discussed home BP monitoring and weekly readings, reviewed BP targets, reviewed recommended health maintenance checks and ensured she is up to date, will arrange for 6 month Link TO Wellness follow up in May 2017     RNCM to fax today's office visit note to Dr. Cindie Laroche. Since patient sees her primary care provider every 3 months this RNCM will meet every 6 months and as needed to assist with Type II DM, HTN and  hyperlipidemia self-management and assess patient's progress toward mutually set goals.  Barrington Ellison RN,CCM,CDE [place] Management Coordinator Link To Wellness Office Phone [phone] Office Fax [phone]

## 2015-11-04 ENCOUNTER — Encounter: Payer: Self-pay | Admitting: Nutrition

## 2015-11-04 ENCOUNTER — Encounter: Payer: 59 | Attending: Family Medicine | Admitting: Nutrition

## 2015-11-04 DIAGNOSIS — Z713 Dietary counseling and surveillance: Secondary | ICD-10-CM | POA: Diagnosis not present

## 2015-11-04 DIAGNOSIS — E119 Type 2 diabetes mellitus without complications: Secondary | ICD-10-CM | POA: Diagnosis present

## 2015-11-04 NOTE — Progress Notes (Signed)
  Medical Nutrition Therapy:  Appt start time: 0830 end time:  0845  Assessment:  Primary concerns today: DIabetes Type 2. .  Lost 7 lbs since last visit. Still Bydureon weekly. Started Provastatin. Metformin 1000 mg per day. Januvia 100 mg. A1C 6.9% per [facility] results. Sees PCP in December. PHysical actvity: 30 minutes 2-3 times a week. Has cut out eating after 7 pm. Drinks water or unsweet tea. Eating a higher fiber diet and it has helped her feel more fulll and helped her diverticulitis.   Preferred Learning Style:  Auditory  Visual  Hands on  Learning Readiness:  Not ready  Contemplating  Ready  Change in progress   MEDICATIONS: See list   DIETARY INTAKE:    24-hr recall:  B ( AM): Oatmeal, fruit-berries and 1 boiled and 2 egg whites, water L ( PM): [place] sub, fruit, water, Snk ( PM): none D ( PM): Baked chicken and green beans, water  Snk ( PM):   Beverages: water  Usual physical activity: ADL but does some walking on her job.  Estimated energy needs: 1500 calories 170 g carbohydrates 112 g protein 42 g fat  Progress Towards Goal(s):  Some progress.   Nutritional Diagnosis:  NB-1.1 Food and nutrition-related knowledge deficit As related to Diabetes.  As evidenced by A1C 7.4%..    Intervention:  Nutrition counseling and diabetes education provided on disease, Carb counting, meal planning, MY Plate, portion sizes, low fat low sodium high fiber diet, benefits of exercise, signs/symptoms and treatment of hyper/hypoglycemial and prevention of complications and testing and using results..Low fiber and high fiber diet due to Diverticulitis.   Goal: 1. Follow Plate Method as discussed. 2. Increase water intake to 3 Yetti cups per day. 3. Increase exercise 30 minutes 4 times per week. 4. Lose 10 lbs in the next 2 months. 5. Get A1C 6.5% in the next three months.  Teaching Method Utilized:  Visual Auditory Hands on  Handouts given during visit include:  The  Plate Method  The Meal Plan Card  Barriers to learning/adherence to lifestyle change: None  Demonstrated degree of understanding via:  Teach Back   Monitoring/Evaluation:  Dietary intake, exercise, meal planning , and body weight in 1 month(s).

## 2015-11-04 NOTE — Patient Instructions (Signed)
Goal: 1. Follow Plate Method as discussed. 2. Increase water intake to 3 Yetti cups per day. 3. Increase exercise 30 minutes 4 times per week. 4. Lose 10 lbs in the next 2 months. 5. Get A1C 6.5% in the next three months.

## 2015-11-06 ENCOUNTER — Other Ambulatory Visit (HOSPITAL_COMMUNITY): Payer: Self-pay | Admitting: Family Medicine

## 2015-11-06 DIAGNOSIS — M5441 Lumbago with sciatica, right side: Secondary | ICD-10-CM

## 2015-11-22 ENCOUNTER — Ambulatory Visit (HOSPITAL_COMMUNITY): Payer: 59

## 2015-12-19 ENCOUNTER — Encounter: Payer: 59 | Admitting: Neurology

## 2015-12-19 DIAGNOSIS — M722 Plantar fascial fibromatosis: Secondary | ICD-10-CM | POA: Diagnosis not present

## 2015-12-19 DIAGNOSIS — M1712 Unilateral primary osteoarthritis, left knee: Secondary | ICD-10-CM | POA: Diagnosis not present

## 2016-01-01 MED FILL — TRUE METRIX GLUCOSE TEST ST: 50 days supply | Qty: 100 | Fill #4

## 2016-01-06 ENCOUNTER — Ambulatory Visit: Payer: 59 | Admitting: Nutrition

## 2016-01-06 ENCOUNTER — Encounter: Payer: Self-pay | Admitting: Nutrition

## 2016-01-06 ENCOUNTER — Encounter: Payer: 59 | Attending: Family Medicine | Admitting: Nutrition

## 2016-01-06 VITALS — Ht 63.0 in | Wt 248.8 lb

## 2016-01-06 DIAGNOSIS — Z713 Dietary counseling and surveillance: Secondary | ICD-10-CM | POA: Insufficient documentation

## 2016-01-06 DIAGNOSIS — E119 Type 2 diabetes mellitus without complications: Secondary | ICD-10-CM | POA: Insufficient documentation

## 2016-01-06 DIAGNOSIS — E1165 Type 2 diabetes mellitus with hyperglycemia: Secondary | ICD-10-CM

## 2016-01-06 DIAGNOSIS — IMO0002 Reserved for concepts with insufficient information to code with codable children: Secondary | ICD-10-CM

## 2016-01-06 DIAGNOSIS — E118 Type 2 diabetes mellitus with unspecified complications: Secondary | ICD-10-CM

## 2016-01-06 NOTE — Progress Notes (Signed)
  Medical Nutrition Therapy:  Appt start time: 0830 end time:  0845   Assessment:  Primary concerns today: DIabetes Type 2.  Last A1C was 6.9% in Nov 2016. Scheduled to get it done end of Jan/Feb 2017. She lost her only sibling, her brother after Christmas due to a heart attack. Has been depressed and mourning and says she is just eating. Gained 5 lbs back. Now working on 3rd shift and needs to get her meal times figured out.  Taking Metformin 500 mg BID, Januvia and Bydureon daily. Going to start walking on treadmill.  She is willing and ready to get things back on track and start taking better care of herself.  Diet has been inconsistent she notes.    Learning Style:  Auditory  Visual  Hands on  Learning Readiness:  Not ready  Contemplating  Ready  Change in progress   MEDICATIONS: See list   DIETARY INTAKE:    24-hr recall:  Meals and times of meal have been sporadic due to working 3rd shift. Admits to snacking and doing more fast food meals and eating more processed foods than usual.  Does drink water. Beverages: water  Usual physical activity: ADL but does some walking on her job. Willing to start walking on the treadmill.  Estimated energy needs: 1500 calories 170 g carbohydrates 112 g protein 42 g fat  Progress Towards Goal(s):  Some progress.   Nutritional Diagnosis:  NB-1.1 Food and nutrition-related knowledge deficit As related to Diabetes.  As evidenced by A1C 7.4%..    Intervention:  Nutrition counseling and diabetes education provided on disease, Carb counting, meal planning, MY Plate, portion sizes, low fat low sodium high fiber diet, benefits of exercise, signs/symptoms and treatment of hyper/hypoglycemial and prevention of complications and testing and using results..Low fiber and high fiber diet due to Diverticulitis.   Goal: 1. Get back to following the Plate Method. 2. Eat meals at times discussed. 3. Exercise 30 minutes 5 times per week. 4.  Get A1C down to 6.5%. 5. Lose 5 lbs by next visit. 6. Increase low carb vegetables and fresh fruit and whole grains.. 7. Cut out snacks 8. Cut out processed foods.  Teaching Method Utilized:  Visual Auditory Hands on  Handouts given during visit include:  The Plate Method  The Meal Plan Card  Barriers to learning/adherence to lifestyle change: None  Demonstrated degree of understanding via:  Teach Back   Monitoring/Evaluation:  Dietary intake, exercise, meal planning , and body weight in 1-3 month(s).

## 2016-01-06 NOTE — Patient Instructions (Signed)
Goals: 1. Get back to following the Plate Method. 2. Eat meals at times discussed. 3. Exercise 30 minutes 5 times per week. 4. Get A1C down to 6.5%. 5. Lose 5 lbs by next visit. 6. Increase low carb vegetables and fresh fruit and whole grain. 7. Cut out snacks 8. Cut out processed foods.

## 2016-01-13 DIAGNOSIS — R928 Other abnormal and inconclusive findings on diagnostic imaging of breast: Secondary | ICD-10-CM | POA: Diagnosis not present

## 2016-01-13 DIAGNOSIS — Z6841 Body Mass Index (BMI) 40.0 and over, adult: Secondary | ICD-10-CM | POA: Diagnosis not present

## 2016-01-13 DIAGNOSIS — Z01419 Encounter for gynecological examination (general) (routine) without abnormal findings: Secondary | ICD-10-CM | POA: Diagnosis not present

## 2016-01-13 DIAGNOSIS — N951 Menopausal and female climacteric states: Secondary | ICD-10-CM | POA: Diagnosis not present

## 2016-01-13 DIAGNOSIS — Z1231 Encounter for screening mammogram for malignant neoplasm of breast: Secondary | ICD-10-CM | POA: Diagnosis not present

## 2016-01-14 MED FILL — metFORMIN HCL 500 MG TABS: 500 | 90 days supply | Qty: 180 | Fill #2

## 2016-01-14 MED FILL — ALPRAZolam 0.5 MG TABS: 0.5 | 30 days supply | Qty: 60 | Fill #3

## 2016-01-17 ENCOUNTER — Other Ambulatory Visit: Payer: Self-pay | Admitting: *Deleted

## 2016-01-17 NOTE — Patient Outreach (Signed)
Secure e-mail sent to Pam's personal e-mail address requesting update. She was to see her primary care MD in December so request was made to include lab results. Await return response from [place] RN,CCM,CDE [place] Management Coordinator Link To Wellness Office Phone [phone] Office Fax [phone]

## 2016-01-22 DIAGNOSIS — R928 Other abnormal and inconclusive findings on diagnostic imaging of breast: Secondary | ICD-10-CM | POA: Diagnosis not present

## 2016-01-22 DIAGNOSIS — R590 Localized enlarged lymph nodes: Secondary | ICD-10-CM | POA: Diagnosis not present

## 2016-01-22 DIAGNOSIS — N6489 Other specified disorders of breast: Secondary | ICD-10-CM | POA: Diagnosis not present

## 2016-01-24 ENCOUNTER — Other Ambulatory Visit: Payer: Self-pay | Admitting: *Deleted

## 2016-01-24 NOTE — Patient Outreach (Signed)
Faxed request to Dr. Denita Lung office requesting patient's most recent labs and office visit note. Barrington Ellison RN,CCM,CDE [place] Management Coordinator Link To Wellness Office Phone [phone] Office Fax [phone]

## 2016-01-31 DIAGNOSIS — J3481 Nasal mucositis (ulcerative): Secondary | ICD-10-CM | POA: Diagnosis not present

## 2016-01-31 DIAGNOSIS — I119 Hypertensive heart disease without heart failure: Secondary | ICD-10-CM | POA: Diagnosis not present

## 2016-01-31 DIAGNOSIS — M545 Low back pain: Secondary | ICD-10-CM | POA: Diagnosis not present

## 2016-01-31 DIAGNOSIS — E1165 Type 2 diabetes mellitus with hyperglycemia: Secondary | ICD-10-CM | POA: Diagnosis not present

## 2016-01-31 DIAGNOSIS — M2550 Pain in unspecified joint: Secondary | ICD-10-CM | POA: Diagnosis not present

## 2016-02-01 ENCOUNTER — Encounter (HOSPITAL_COMMUNITY): Payer: Self-pay

## 2016-02-01 ENCOUNTER — Emergency Department (HOSPITAL_COMMUNITY): Payer: 59

## 2016-02-01 ENCOUNTER — Emergency Department (HOSPITAL_COMMUNITY)
Admission: EM | Admit: 2016-02-01 | Discharge: 2016-02-02 | Disposition: A | Discharge status: Home / Self Care | Payer: 59 | Attending: Emergency Medicine | Admitting: Emergency Medicine

## 2016-02-01 DIAGNOSIS — Z792 Long term (current) use of antibiotics: Secondary | ICD-10-CM | POA: Diagnosis not present

## 2016-02-01 DIAGNOSIS — F419 Anxiety disorder, unspecified: Secondary | ICD-10-CM | POA: Diagnosis not present

## 2016-02-01 DIAGNOSIS — J45901 Unspecified asthma with (acute) exacerbation: Secondary | ICD-10-CM | POA: Insufficient documentation

## 2016-02-01 DIAGNOSIS — Z87891 Personal history of nicotine dependence: Secondary | ICD-10-CM | POA: Insufficient documentation

## 2016-02-01 DIAGNOSIS — Z7982 Long term (current) use of aspirin: Secondary | ICD-10-CM | POA: Insufficient documentation

## 2016-02-01 DIAGNOSIS — R0789 Other chest pain: Secondary | ICD-10-CM | POA: Diagnosis not present

## 2016-02-01 DIAGNOSIS — Z79899 Other long term (current) drug therapy: Secondary | ICD-10-CM | POA: Diagnosis not present

## 2016-02-01 DIAGNOSIS — J069 Acute upper respiratory infection, unspecified: Secondary | ICD-10-CM | POA: Diagnosis not present

## 2016-02-01 DIAGNOSIS — Z7984 Long term (current) use of oral hypoglycemic drugs: Secondary | ICD-10-CM | POA: Insufficient documentation

## 2016-02-01 DIAGNOSIS — Z7951 Long term (current) use of inhaled steroids: Secondary | ICD-10-CM | POA: Insufficient documentation

## 2016-02-01 DIAGNOSIS — E119 Type 2 diabetes mellitus without complications: Secondary | ICD-10-CM | POA: Insufficient documentation

## 2016-02-01 DIAGNOSIS — R110 Nausea: Secondary | ICD-10-CM | POA: Diagnosis not present

## 2016-02-01 DIAGNOSIS — I10 Essential (primary) hypertension: Secondary | ICD-10-CM | POA: Diagnosis not present

## 2016-02-01 DIAGNOSIS — B9789 Other viral agents as the cause of diseases classified elsewhere: Secondary | ICD-10-CM

## 2016-02-01 DIAGNOSIS — R05 Cough: Secondary | ICD-10-CM | POA: Diagnosis not present

## 2016-02-01 DIAGNOSIS — R0602 Shortness of breath: Secondary | ICD-10-CM | POA: Diagnosis present

## 2016-02-01 LAB — CBC WITH DIFFERENTIAL/PLATELET
Basophils Absolute: 0 10*3/uL (ref 0.0–0.1)
Basophils Relative: 0 %
EOS ABS: 0.4 10*3/uL (ref 0.0–0.7)
EOS PCT: 5 %
HCT: 37.2 % (ref 36.0–46.0)
Hemoglobin: 12.8 g/dL (ref 12.0–15.0)
LYMPHS ABS: 1.4 10*3/uL (ref 0.7–4.0)
Lymphocytes Relative: 18 %
MCH: 29.8 pg (ref 26.0–34.0)
MCHC: 34.4 g/dL (ref 30.0–36.0)
MCV: 86.5 fL (ref 78.0–100.0)
Monocytes Absolute: 1 10*3/uL (ref 0.1–1.0)
Monocytes Relative: 13 %
Neutro Abs: 4.9 10*3/uL (ref 1.7–7.7)
Neutrophils Relative %: 64 %
PLATELETS: 255 10*3/uL (ref 150–400)
RBC: 4.3 MIL/uL (ref 3.87–5.11)
RDW: 13.6 % (ref 11.5–15.5)
WBC: 7.7 10*3/uL (ref 4.0–10.5)

## 2016-02-01 MED ORDER — ONDANSETRON 4 MG PO TBDP
4.0000 mg | ORAL_TABLET | Freq: Once | ORAL | Status: AC
  Administered 2016-02-01: 4 mg via ORAL
  Filled 2016-02-01: qty 1

## 2016-02-01 MED ORDER — IPRATROPIUM-ALBUTEROL 0.5-2.5 (3) MG/3ML IN SOLN
3.0000 mL | Freq: Once | RESPIRATORY_TRACT | Status: AC
  Administered 2016-02-01: 3 mL via RESPIRATORY_TRACT
  Filled 2016-02-01: qty 3

## 2016-02-01 NOTE — ED Provider Notes (Signed)
By signing my name below, I, Hansel Feinstein, attest that this documentation has been prepared under the direction and in the presence of [place], DO. Electronically Signed: Hansel Feinstein, ED Scribe. 02/01/2016. 11:27 PM.   TIME SEEN: 11:22 PM   CHIEF COMPLAINT:  Chief Complaint  Patient presents with  . Shortness of Breath    HPI:  HPI Comments: [name] is a 55 y.o. female with h/o HTN, DM, asthma, pneumonia who presents to the Emergency Department complaining of moderate SOB onset tonight with associated dry cough x 3 days, congestion, chest tightness, chills, nausea. No h/o heart disease, heart failure, PE/DVT, CAD. 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 No h/o cardiac catheterization. Last stress test was in 2014. Pt is a former smoker, quit 30 years ago. Pt states she has had a flu shot this year. She denies fever, emesis, diarrhea, leg pain, leg swelling. States this feels similar to her prior episode of pneumonia.  ROS: See HPI Constitutional: no fever. Positive for chills  Eyes: no drainage  ENT: no runny nose. Positive for congestion   Cardiovascular:  no chest pain. Positive for chest tightness  Resp: Positive for SOB, cough GI: no vomiting. Positive for nausea GU: no dysuria Integumentary: no rash  Allergy: no hives  Musculoskeletal: no leg swelling  Neurological: no slurred speech ROS otherwise negative  PAST MEDICAL HISTORY/PAST SURGICAL HISTORY:  Past Medical History  Diagnosis Date  . Hypertension   . Diabetes mellitus   . Anxiety   . Asthma     diagnosed as an adult    MEDICATIONS:  Prior to Admission medications   Medication Sig Start Date End Date Taking? Authorizing Provider  acetaminophen (TYLENOL) 500 MG tablet Take 1,000 mg by mouth at bedtime as needed. For pain   Yes Historical Provider, MD  ALPRAZolam Duanne Moron) 0.5 MG tablet Take 0.5 mg by mouth at bedtime as needed for anxiety.   Yes Historical Provider, MD   amLODipine (NORVASC) 10 MG tablet Take 10 mg by mouth daily.   Yes Historical Provider, MD  aspirin EC 81 MG tablet Take 81 mg by mouth daily.   Yes Historical Provider, MD  beclomethasone (QVAR) 40 MCG/ACT inhaler Inhale 1 puff into the lungs 2 (two) times daily.   Yes Historical Provider, MD  cephALEXin (KEFLEX) 500 MG capsule Take 500 mg by mouth 4 (four) times daily.   Yes Historical Provider, MD  estrogens, conjugated, (PREMARIN) 0.625 MG tablet Take 0.625 mg by mouth daily. Take daily for 21 days then do not take for 7 days.   Yes Historical Provider, MD  Exenatide ER (BYDUREON) 2 MG PEN Inject 2 mg into the skin once a week.   Yes Historical Provider, MD  fexofenadine (ALLEGRA) 180 MG tablet Take 180 mg by mouth daily.   Yes Historical Provider, MD  LOSARTAN POTASSIUM PO Take by mouth.   Yes Historical Provider, MD  METFORMIN HCL PO Take 500 mg by mouth 2 (two) times daily.    Yes Historical Provider, MD  metoprolol (LOPRESSOR) 50 MG tablet Take 50 mg by mouth 2 (two) times daily.   Yes Historical Provider, MD  montelukast (SINGULAIR) 10 MG tablet Take 10 mg by mouth at bedtime.   Yes Historical Provider, MD  ranitidine (ZANTAC) 150 MG tablet Take 150 mg by mouth 2 (two) times daily.   Yes Historical Provider, MD  sitaGLIPtin (JANUVIA) 100 MG tablet Take 100 mg by mouth daily.  Yes Historical Provider, MD  amoxicillin-clavulanate (AUGMENTIN) 875-125 MG per tablet Take 1 tablet by mouth 2 (two) times daily. For 10 days, starting 05/22/2014 05/22/14   Historical Provider, MD  Azelastine-Fluticasone 137-50 MCG/ACT SUSP Place into the nose.    Historical Provider, MD  fluticasone (FLONASE) 50 MCG/ACT nasal spray Place 2 sprays into the nose daily.    Historical Provider, MD  levofloxacin (LEVAQUIN) 750 MG tablet Take 1 tablet (750 mg total) by mouth daily. 05/23/14   Fredia Sorrow, MD  Pioglitazone HCl (ACTOS PO) Take by mouth.    Historical Provider, MD  promethazine (PHENERGAN) 25 MG tablet  Take 1 tablet (25 mg total) by mouth every 6 (six) hours as needed for nausea. 03/17/12 03/24/12  Ripley Fraise, MD  valsartan-hydrochlorothiazide (DIOVAN-HCT) 320-25 MG per tablet Take 1 tablet by mouth daily.    Historical Provider, MD    ALLERGIES:  Allergies  Allergen Reactions  . Barium-Containing Compounds Itching and Other (See Comments)    Weakness, loss of voice  . Statins Other (See Comments)    Myalgia, muscle weakness  . Sulfa Antibiotics Itching and Other (See Comments)    Weakness, loss of voice    SOCIAL HISTORY:  Social History  Substance Use Topics  . Smoking status: Former Research scientist (life sciences)  . Smokeless tobacco: Never Used  . Alcohol Use: No    FAMILY HISTORY: Family History  Problem Relation Age of Onset  . Hypertension Mother   . Diabetes Mother     EXAM: BP 157/65 mmHg  Pulse 93  Temp(Src) 98.1 F (36.7 C) (Oral)  Resp 22  SpO2 98% CONSTITUTIONAL: Alert and oriented and responds appropriately to questions. Well-appearing; well-nourished, afebrile and nontoxic HEAD: Normocephalic EYES: Conjunctivae clear, PERRL ENT: normal nose; no rhinorrhea; moist mucous membranes; pharynx without lesions noted NECK: Supple, no meningismus, no LAD  CARD: RRR; S1 and S2 appreciated; no murmurs, no clicks, no rubs, no gallops RESP: Mild scattered expiratory wheezes. Normal chest excursion without splinting or tachypnea; breath sounds equal bilaterally; no rhonchi, no rales, no hypoxia or respiratory distress, speaking full sentences ABD/GI: Normal bowel sounds; non-distended; soft, non-tender, no rebound, no guarding, no peritoneal signs BACK:  The back appears normal and is non-tender to palpation, there is no CVA tenderness EXT: Normal ROM in all joints; non-tender to palpation; no edema; normal capillary refill; no cyanosis, no calf tenderness or swelling    SKIN: Normal color for age and race; warm NEURO: Moves all extremities equally, sensation to light touch intact  diffusely, cranial nerves II through XII intact PSYCH: The patient's mood and manner are appropriate. Grooming and personal hygiene are appropriate.  MEDICAL DECISION MAKING: Patient here with flulike symptoms, viral illness. Has had some wheezing, cough, shortness of breath and chest tightness. We'll give breathing treatment and reassess. We'll obtain labs including troponin and chest x-ray. EKG shows no ischemic abnormality. Suspicion for ACS, PE, dissection.  ED PROGRESS: Pt's labs are unremarkable including negative troponin. Chest x-ray clear. Patient reports feeling better after breathing treatments, Zofran. Will discharge with prescriptions for the same as well as guaifenesin with codeine for symptomatically relief. We'll provide work no. She is outside of any treatment window for Tamiflu at this was influenza. Have advised increased fluid intake, alternating Tylenol and ibuprofen for fever and pain and rest. Discussed return precautions. She verbalized understanding and is comfortable with this plan.   EKG Interpretation  Date/Time:  Saturday February 01 2016 22:55:18 EST Ventricular Rate:  93 PR Interval:  195  QRS Duration: 105 QT Interval:  386 QTC Calculation: 480 R Axis:   86 Text Interpretation:  Sinus rhythm No significant change since last tracing Confirmed by Sadie Hazelett,  DO, Maximillion Gill [id number]) on 02/01/2016 11:26:00 PM         I personally performed the services described in this documentation, which was scribed in my presence. The recorded information has been reviewed and is accurate.   [place], DO 02/02/16 [phone]

## 2016-02-01 NOTE — ED Notes (Signed)
Patient states that she is having shortness of breath. Feels like her lower lungs are not working. Also has a cough and sinus pressure. Unknown if running a fever. Nauseated per pt.

## 2016-02-02 DIAGNOSIS — I10 Essential (primary) hypertension: Secondary | ICD-10-CM | POA: Diagnosis not present

## 2016-02-02 DIAGNOSIS — J069 Acute upper respiratory infection, unspecified: Secondary | ICD-10-CM | POA: Diagnosis not present

## 2016-02-02 DIAGNOSIS — Z7951 Long term (current) use of inhaled steroids: Secondary | ICD-10-CM | POA: Diagnosis not present

## 2016-02-02 DIAGNOSIS — Z7982 Long term (current) use of aspirin: Secondary | ICD-10-CM | POA: Diagnosis not present

## 2016-02-02 DIAGNOSIS — F419 Anxiety disorder, unspecified: Secondary | ICD-10-CM | POA: Diagnosis not present

## 2016-02-02 DIAGNOSIS — R0602 Shortness of breath: Secondary | ICD-10-CM | POA: Diagnosis not present

## 2016-02-02 DIAGNOSIS — J45901 Unspecified asthma with (acute) exacerbation: Secondary | ICD-10-CM | POA: Diagnosis not present

## 2016-02-02 DIAGNOSIS — E119 Type 2 diabetes mellitus without complications: Secondary | ICD-10-CM | POA: Diagnosis not present

## 2016-02-02 DIAGNOSIS — R110 Nausea: Secondary | ICD-10-CM | POA: Diagnosis not present

## 2016-02-02 DIAGNOSIS — R05 Cough: Secondary | ICD-10-CM | POA: Diagnosis not present

## 2016-02-02 DIAGNOSIS — Z792 Long term (current) use of antibiotics: Secondary | ICD-10-CM | POA: Diagnosis not present

## 2016-02-02 LAB — COMPREHENSIVE METABOLIC PANEL
ALT: 14 U/L (ref 14–54)
AST: 18 U/L (ref 15–41)
Albumin: 3.6 g/dL (ref 3.5–5.0)
Alkaline Phosphatase: 54 U/L (ref 38–126)
Anion gap: 11 (ref 5–15)
BUN: 11 mg/dL (ref 6–20)
CHLORIDE: 99 mmol/L — AB (ref 101–111)
CO2: 26 mmol/L (ref 22–32)
Calcium: 8.8 mg/dL — ABNORMAL LOW (ref 8.9–10.3)
Creatinine, Ser: 0.52 mg/dL (ref 0.44–1.00)
GFR calc non Af Amer: >60 mL/min (ref >60)
Glucose, Bld: 138 mg/dL — ABNORMAL HIGH (ref 65–99)
POTASSIUM: 3.3 mmol/L — AB (ref 3.5–5.1)
Sodium: 136 mmol/L (ref 135–145)
Total Bilirubin: 0.2 mg/dL — ABNORMAL LOW (ref 0.3–1.2)
Total Protein: 7.6 g/dL (ref 6.5–8.1)

## 2016-02-02 LAB — TROPONIN I

## 2016-02-02 MED ORDER — GUAIFENESIN-CODEINE 100-10 MG/5ML PO SOLN: 5.0000 mL | Freq: Four times a day (QID) | ORAL | Status: DC | PRN

## 2016-02-02 MED ORDER — ALBUTEROL SULFATE (2.5 MG/3ML) 0.083% IN NEBU
2.5000 mg | INHALATION_SOLUTION | Freq: Once | RESPIRATORY_TRACT | Status: AC
Start: 2016-02-02 — End: 2016-02-02
  Administered 2016-02-02: 2.5 mg via RESPIRATORY_TRACT
  Filled 2016-02-02: qty 3

## 2016-02-02 MED ORDER — ALBUTEROL SULFATE HFA 108 (90 BASE) MCG/ACT IN AERS
2.0000 | INHALATION_SPRAY | Freq: Four times a day (QID) | RESPIRATORY_TRACT | Status: DC
  Filled 2016-02-02: qty 6.7

## 2016-02-02 MED ORDER — IPRATROPIUM-ALBUTEROL 0.5-2.5 (3) MG/3ML IN SOLN
3.0000 mL | Freq: Once | RESPIRATORY_TRACT | Status: AC
  Administered 2016-02-02: 3 mL via RESPIRATORY_TRACT
  Filled 2016-02-02: qty 3

## 2016-02-02 MED ORDER — ONDANSETRON 4 MG PO TBDP
4.0000 mg | ORAL_TABLET | Freq: Once | ORAL | Status: AC
  Administered 2016-02-02: 4 mg via ORAL

## 2016-02-02 MED ORDER — ONDANSETRON 4 MG PO TBDP: 4.0000 mg | ORAL_TABLET | Freq: Three times a day (TID) | ORAL | Status: DC | PRN

## 2016-02-02 MED ORDER — ALBUTEROL SULFATE HFA 108 (90 BASE) MCG/ACT IN AERS: 2.0000 | INHALATION_SPRAY | RESPIRATORY_TRACT | Status: DC | PRN

## 2016-02-02 MED ORDER — ONDANSETRON 4 MG PO TBDP
ORAL_TABLET | ORAL | Status: AC
  Filled 2016-02-02: qty 1

## 2016-02-02 NOTE — Discharge Instructions (Signed)
Upper Respiratory Infection, Adult Most upper respiratory infections (URIs) are a viral infection of the air passages leading to the lungs. A URI affects the nose, throat, and upper air passages. The most common type of URI is nasopharyngitis and is typically referred to as "the common cold." URIs run their course and usually go away on their own. Most of the time, a URI does not require medical attention, but sometimes a bacterial infection in the upper airways can follow a viral infection. This is called a secondary infection. Sinus and middle ear infections are common types of secondary upper respiratory infections. Bacterial pneumonia can also complicate a URI. A URI can worsen asthma and chronic obstructive pulmonary disease (COPD). Sometimes, these complications can require emergency medical care and may be life threatening.  CAUSES Almost all URIs are caused by viruses. A virus is a type of germ and can spread from one person to another.  RISKS FACTORS You may be at risk for a URI if:   You smoke.   You have chronic heart or lung disease.  You have a weakened defense (immune) system.   You are very young or very old.   You have nasal allergies or asthma.  You work in crowded or poorly ventilated areas.  You work in health care facilities or schools. SIGNS AND SYMPTOMS  Symptoms typically develop 2-3 days after you come in contact with a cold virus. Most viral URIs last 7-10 days. However, viral URIs from the influenza virus (flu virus) can last 14-18 days and are typically more severe. Symptoms may include:   Runny or stuffy (congested) nose.   Sneezing.   Cough.   Sore throat.   Headache.   Fatigue.   Fever.   Loss of appetite.   Pain in your forehead, behind your eyes, and over your cheekbones (sinus pain).  Muscle aches.  DIAGNOSIS  Your health care provider may diagnose a URI by:  Physical exam.  Tests to check that your symptoms are not due to  another condition such as:  Strep throat.  Sinusitis.  Pneumonia.  Asthma. TREATMENT  A URI goes away on its own with time. It cannot be cured with medicines, but medicines may be prescribed or recommended to relieve symptoms. Medicines may help:  Reduce your fever.  Reduce your cough.  Relieve nasal congestion. HOME CARE INSTRUCTIONS   Take medicines only as directed by your health care provider.   Gargle warm saltwater or take cough drops to comfort your throat as directed by your health care provider.  Use a warm mist humidifier or inhale steam from a shower to increase air moisture. This may make it easier to breathe.  Drink enough fluid to keep your urine clear or pale yellow.   Eat soups and other clear broths and maintain good nutrition.   Rest as needed.   Return to work when your temperature has returned to normal or as your health care provider advises. You may need to stay home longer to avoid infecting others. You can also use a face mask and careful hand washing to prevent spread of the virus.  Increase the usage of your inhaler if you have asthma.   Do not use any tobacco products, including cigarettes, chewing tobacco, or electronic cigarettes. If you need help quitting, ask your health care provider. PREVENTION  The best way to protect yourself from getting a cold is to practice good hygiene.   Avoid oral or hand contact with people with cold   symptoms.   Wash your hands often if contact occurs.  There is no clear evidence that vitamin C, vitamin E, echinacea, or exercise reduces the chance of developing a cold. However, it is always recommended to get plenty of rest, exercise, and practice good nutrition.  SEEK MEDICAL CARE IF:   You are getting worse rather than better.   Your symptoms are not controlled by medicine.   You have chills.  You have worsening shortness of breath.  You have brown or red mucus.  You have yellow or brown nasal  discharge.  You have pain in your face, especially when you bend forward.  You have a fever.  You have swollen neck glands.  You have pain while swallowing.  You have white areas in the back of your throat. SEEK IMMEDIATE MEDICAL CARE IF:   You have severe or persistent:  Headache.  Ear pain.  Sinus pain.  Chest pain.  You have chronic lung disease and any of the following:  Wheezing.  Prolonged cough.  Coughing up blood.  A change in your usual mucus.  You have a stiff neck.  You have changes in your:  Vision.  Hearing.  Thinking.  Mood. MAKE SURE YOU:   Understand these instructions.  Will watch your condition.  Will get help right away if you are not doing well or get worse.   This information is not intended to replace advice given to you by your health care provider. Make sure you discuss any questions you have with your health care provider.   Document Released: 05/26/2001 Document Revised: 04/16/2015 Document Reviewed: 03/07/2014 Elsevier Interactive Patient Education 2016 Elsevier Inc.  

## 2016-02-06 DIAGNOSIS — K210 Gastro-esophageal reflux disease with esophagitis: Secondary | ICD-10-CM | POA: Diagnosis not present

## 2016-02-06 DIAGNOSIS — J3089 Other allergic rhinitis: Secondary | ICD-10-CM | POA: Diagnosis not present

## 2016-02-06 DIAGNOSIS — E1165 Type 2 diabetes mellitus with hyperglycemia: Secondary | ICD-10-CM | POA: Diagnosis not present

## 2016-02-06 DIAGNOSIS — I10 Essential (primary) hypertension: Secondary | ICD-10-CM | POA: Diagnosis not present

## 2016-02-13 MED FILL — METOPROLOL SUCC ER 50 MG TA: 50 | 30 days supply | Qty: 30 | Fill #1

## 2016-02-13 MED FILL — MONTELUKAST SOD 10 MG TAB: 10 | 90 days supply | Qty: 90 | Fill #1

## 2016-02-18 MED FILL — ALPRAZolam 0.5 MG TABS: 0.5 | 30 days supply | Qty: 60 | Fill #4

## 2016-02-21 MED FILL — PREMARIN 0.625 MG TABLET: 0.625 | 90 days supply | Qty: 90 | Fill #0

## 2016-03-17 MED FILL — AMLODIPINE BESYLATE 10 MG T: 10 | 90 days supply | Qty: 90 | Fill #2

## 2016-03-17 MED FILL — JANUVIA 100 MG TABLET: 100 | 90 days supply | Qty: 90 | Fill #2

## 2016-03-17 MED FILL — METOPROLOL SUCC ER 50 MG TA: 50 | 30 days supply | Qty: 30 | Fill #2

## 2016-03-17 MED FILL — VALSARTAN-HCTZ 320-25 MG TA: 320-25 | 90 days supply | Qty: 90 | Fill #2

## 2016-03-23 MED FILL — BYDUREON 2 MG PEN INJECT: 2 | 84 days supply | Qty: 12 | Fill #1

## 2016-03-23 MED FILL — TRUE METRIX GLUCOSE TEST ST: 50 days supply | Qty: 100 | Fill #5

## 2016-03-30 ENCOUNTER — Ambulatory Visit: Payer: 59 | Admitting: Nutrition

## 2016-04-07 ENCOUNTER — Telehealth: Payer: Self-pay

## 2016-04-15 ENCOUNTER — Ambulatory Visit: Payer: 59 | Admitting: Nutrition

## 2016-04-17 MED FILL — metFORMIN HCL 500 MG TABS: 500 | 90 days supply | Qty: 180 | Fill #3

## 2016-04-17 MED FILL — ALPRAZolam 0.5 MG TABS: 0.5 | 30 days supply | Qty: 30 | Fill #0

## 2016-04-17 MED FILL — METOPROLOL SUCC ER 50 MG TA: 50 | 30 days supply | Qty: 30 | Fill #3

## 2016-05-04 DIAGNOSIS — Z6841 Body Mass Index (BMI) 40.0 and over, adult: Secondary | ICD-10-CM | POA: Diagnosis not present

## 2016-05-04 DIAGNOSIS — G894 Chronic pain syndrome: Secondary | ICD-10-CM | POA: Diagnosis not present

## 2016-05-04 DIAGNOSIS — M1991 Primary osteoarthritis, unspecified site: Secondary | ICD-10-CM | POA: Diagnosis not present

## 2016-05-04 DIAGNOSIS — Z1389 Encounter for screening for other disorder: Secondary | ICD-10-CM | POA: Diagnosis not present

## 2016-05-04 DIAGNOSIS — E119 Type 2 diabetes mellitus without complications: Secondary | ICD-10-CM | POA: Diagnosis not present

## 2016-05-18 DIAGNOSIS — Z1389 Encounter for screening for other disorder: Secondary | ICD-10-CM | POA: Diagnosis not present

## 2016-05-18 DIAGNOSIS — M1991 Primary osteoarthritis, unspecified site: Secondary | ICD-10-CM | POA: Diagnosis not present

## 2016-05-18 DIAGNOSIS — F419 Anxiety disorder, unspecified: Secondary | ICD-10-CM | POA: Diagnosis not present

## 2016-05-18 DIAGNOSIS — H109 Unspecified conjunctivitis: Secondary | ICD-10-CM | POA: Diagnosis not present

## 2016-05-18 DIAGNOSIS — Z6841 Body Mass Index (BMI) 40.0 and over, adult: Secondary | ICD-10-CM | POA: Diagnosis not present

## 2016-05-18 DIAGNOSIS — G894 Chronic pain syndrome: Secondary | ICD-10-CM | POA: Diagnosis not present

## 2016-05-18 DIAGNOSIS — I10 Essential (primary) hypertension: Secondary | ICD-10-CM | POA: Diagnosis not present

## 2016-05-26 DIAGNOSIS — J310 Chronic rhinitis: Secondary | ICD-10-CM | POA: Diagnosis not present

## 2016-05-26 DIAGNOSIS — H6983 Other specified disorders of Eustachian tube, bilateral: Secondary | ICD-10-CM | POA: Diagnosis not present

## 2016-05-26 DIAGNOSIS — J343 Hypertrophy of nasal turbinates: Secondary | ICD-10-CM | POA: Diagnosis not present

## 2016-05-26 DIAGNOSIS — R070 Pain in throat: Secondary | ICD-10-CM | POA: Diagnosis not present

## 2016-05-26 MED FILL — METOPROLOL SUCC ER 50 MG TA: 50 | 90 days supply | Qty: 90 | Fill #4

## 2016-05-26 MED FILL — PREMARIN 0.625 MG TABLET: 0.625 | 90 days supply | Qty: 90 | Fill #1

## 2016-05-26 MED FILL — IPRATROPIUM 0.06% SPRAY: 0.06 | 30 days supply | Qty: 15 | Fill #0

## 2016-05-26 MED FILL — predniSONE 10 MG TABS: 10 | 6 days supply | Qty: 21 | Fill #0

## 2016-05-27 DIAGNOSIS — Z87898 Personal history of other specified conditions: Secondary | ICD-10-CM | POA: Diagnosis not present

## 2016-05-27 DIAGNOSIS — E0590 Thyrotoxicosis, unspecified without thyrotoxic crisis or storm: Secondary | ICD-10-CM | POA: Diagnosis not present

## 2016-05-27 DIAGNOSIS — Z1389 Encounter for screening for other disorder: Secondary | ICD-10-CM | POA: Diagnosis not present

## 2016-05-27 DIAGNOSIS — N6489 Other specified disorders of breast: Secondary | ICD-10-CM | POA: Diagnosis not present

## 2016-05-27 DIAGNOSIS — E119 Type 2 diabetes mellitus without complications: Secondary | ICD-10-CM | POA: Diagnosis not present

## 2016-05-27 DIAGNOSIS — R590 Localized enlarged lymph nodes: Secondary | ICD-10-CM | POA: Diagnosis not present

## 2016-05-27 DIAGNOSIS — I10 Essential (primary) hypertension: Secondary | ICD-10-CM | POA: Diagnosis not present

## 2016-05-29 DIAGNOSIS — E0590 Thyrotoxicosis, unspecified without thyrotoxic crisis or storm: Secondary | ICD-10-CM | POA: Diagnosis not present

## 2016-06-01 ENCOUNTER — Other Ambulatory Visit: Payer: Self-pay | Admitting: *Deleted

## 2016-06-01 ENCOUNTER — Encounter: Payer: Self-pay | Admitting: *Deleted

## 2016-06-01 NOTE — Patient Outreach (Addendum)
[facility]) Care Management   06/01/2016  [name] 02/19/1961 [id number]  [name] is an 55 y.o. female who presents to the [place] Management office for routine Link To Wellness follow up for self management assistance with Type II DM, HTN and hyperlipidemia.  Subjective:  [name] says she has had two deaths in her family since the last visit in 10/31/2023, her brother died on 12/04/15 from a  sudden MI and her mother died on May 21, 2016 from a stroke. She says she only returned to work this week as she took a leave of absence to care for her Mom at home. 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 She also recognizes that stress and being on Prednisone for a sinus infection has caused an elevation in her blood sugars. She also says she is having a biopsy of a lymph node in her left axilla that was seen on her mammogram on 6/21. She says she changed primary care providers.  Objective:   Review of Systems  Constitutional: Negative.     Physical Exam  Constitutional: She is oriented to person, place, and time. She appears well-developed and well-nourished.  Respiratory: Effort normal.  Neurological: She is alert and oriented to person, place, and time.  Skin: Skin is warm and dry.  Psychiatric: She has a normal mood and affect. Her behavior is normal. Judgment and thought content normal.   Filed Weights   06/01/16 0930  Weight: 244 lb (110.678 kg)   Filed Vitals:   06/01/16 0930  BP: 122/64   Encounter Medications:   Outpatient Encounter Prescriptions as of 06/01/2016  Medication Sig Note  . acetaminophen (TYLENOL) 500 MG tablet Take 1,000 mg by mouth at bedtime as needed. For pain   . albuterol (PROVENTIL HFA;VENTOLIN HFA) 108 (90 Base) MCG/ACT inhaler Inhale 2 puffs into the lungs every 4 (four) hours as needed for wheezing or shortness of  breath.   . ALPRAZolam (XANAX) 0.5 MG tablet Take 0.5 mg by mouth at bedtime as needed for anxiety.   Marland Kitchen amLODipine (NORVASC) 10 MG tablet Take 10 mg by mouth daily.   Marland Kitchen aspirin EC 81 MG tablet Take 81 mg by mouth daily.   . beclomethasone (QVAR) 40 MCG/ACT inhaler Inhale 1 puff into the lungs 2 (two) times daily. 06/01/2016: Uses prn  . estrogens, conjugated, (PREMARIN) 0.625 MG tablet Take 0.625 mg by mouth daily. Take daily for 21 days then do not take for 7 days.   . Exenatide ER (BYDUREON) 2 MG PEN Inject 2 mg into the skin once a week. 10/21/2015: Has consistently taken the Bydureon since early Sept 2016  . fexofenadine (ALLEGRA) 180 MG tablet Take 180 mg by mouth daily.   Marland Kitchen levofloxacin (LEVAQUIN) 500 MG tablet Take 500 mg by mouth daily.   Marland Kitchen LOSARTAN POTASSIUM PO Take by mouth.   . metFORMIN (GLUCOPHAGE) 500 MG tablet Take 500 mg by mouth 2 (two) times daily. 02/01/2016: Received from: External Pharmacy Received Sig:   . metoprolol (LOPRESSOR) 50 MG tablet Take 50 mg by mouth 2 (two) times daily. 10/21/2015: Takes once daily  . montelukast (SINGULAIR) 10 MG tablet Take 10 mg by mouth at bedtime.   . sitaGLIPtin (JANUVIA) 100 MG tablet Take 100 mg by mouth daily.   . cephALEXin (KEFLEX) 500 MG capsule Take 500 mg by mouth  4 (four) times daily. Reported on 06/01/2016   . DUEXIS 800-26.6 MG TABS Take 1 tablet by mouth every 8 (eight) hours as needed. Reported on 06/01/2016 02/01/2016: Received from: External Pharmacy  . guaiFENesin-codeine 100-10 MG/5ML syrup Take 5-10 mLs by mouth every 6 (six) hours as needed for cough. (Patient not taking: Reported on 06/01/2016)   . ondansetron (ZOFRAN ODT) 4 MG disintegrating tablet Take 1 tablet (4 mg total) by mouth every 8 (eight) hours as needed for nausea or vomiting. (Patient not taking: Reported on 06/01/2016)   . ranitidine (ZANTAC) 150 MG tablet Take 150 mg by mouth 2 (two) times daily.    No facility-administered encounter medications on file as of  06/01/2016.    Functional Status:   In your present state of health, do you have any difficulty performing the following activities: 06/01/2016 10/21/2015  Hearing? N N  Vision? N N  Difficulty concentrating or making decisions? N N  Walking or climbing stairs? N N  Dressing or bathing? N N  Doing errands, shopping? N N    Fall/Depression Screening:    PHQ 2/9 Scores 06/01/2016 01/06/2016 01/06/2016 11/04/2015 06/10/2015 04/26/2015  PHQ - 2 Score 0 0 0 0 0 0    Assessment:  [employer] employee and Link To Wellness member with HTN, Type II DM, and hyperlipidemia currently meeting treatment targets for only HTN, Hgb A1C= 7.7% on 06/01/16, up from 6.9% and slightly elevated total cholesterol and LDL.    Plan:  [facility] CM Care Plan Problem One        Most Recent Value   Care Plan Problem One  Patient with morbid obesity (BMI= 43.23), Type II DM, HTN and hyperlipidemia, now only meeting all treatment targets for HTN with BP readings consistently <140/<90 but with worsening glycemic control as evidenced by Hgb A1C= 7.7% (prior Hgb A1C= 6.9%), hyperlipidemia- intolerant to statins, lipid panel on 06/01/16 with slightly elevated total cholesterol at 202 and LDL at 101 ( prior panel was normal)   Role Documenting the Problem One  Care Management Coordinator   Care Plan for Problem One  Active   [facility] Long Term Goal (31-90 days)  Ongoing good control of HTN as evidenced by BP meeting treatment targets of <140/<90 for all readings, improved control of DM as evidenced by  Hgb A1C <7.0% with  blood sugars meeting pre and post meal target >75% of the time, improved control of hyperlipidemia as evidenced by normal lipid profile at next assessment and evidence of weight loss or no weight gain at next assessment   [facility] Long Term Goal Start Date  06/01/16   [facility] Long Term Goal Met Date     Interventions for Problem One Long Term Goal  Reviewed life events that have interfered with [name]'s management of her chronic disease  states, provided time to allow her to discuss in detail the death of her Mom and the death of her brother, emotional support provided, reviewed the effect of stress and Prednisone on blood sugar, encouraged her to focus on the hobbies she enjoys such as reading and participating in her book club, knitting and crocheting, to help her deal with stress and grief, ,reviewed her medications, discussed DM medications of Metformin, Januvia and Bydureon, reinforced importance of taking all medications as prescribed, discussed role of low dose aspirin in the treatment of diabetes, encouraged [name] to make an appointment as part of  her regular follow up with the dietician to assist with weight management, educated her  on how to claim her Weight Smart badge for the [place] program,  again encouraged [name] to consider exercising in the [facility] exercise room during her lunch 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 Nolon Rod office on 06/01/16, reviewed past strategies/behavior changes that resulted in weight loss success and improved health, reviewed recommended health maintenance checks and ensured she is up to date, arranged for Link To Wellness follow up in September      RNCM to fax today's office visit note to Dr. Gerarda Fraction. [facility] will meet quarterly and as needed with patient per Link To Wellness program guidelines to assist with Type II DM, HTN, hyperlipidemia and obesity self-management and assess patient's progress toward mutually set goals.  Barrington Ellison RN,CCM,CDE [place] Management Coordinator Link To Wellness Office Phone [phone] Office Fax [phone]

## 2016-06-03 DIAGNOSIS — R599 Enlarged lymph nodes, unspecified: Secondary | ICD-10-CM | POA: Diagnosis not present

## 2016-06-03 DIAGNOSIS — R590 Localized enlarged lymph nodes: Secondary | ICD-10-CM | POA: Diagnosis not present

## 2016-06-04 ENCOUNTER — Encounter: Payer: 59 | Attending: Internal Medicine | Admitting: Nutrition

## 2016-06-04 ENCOUNTER — Encounter: Payer: Self-pay | Admitting: Nutrition

## 2016-06-04 VITALS — Ht 63.0 in | Wt 250.0 lb

## 2016-06-04 DIAGNOSIS — IMO0002 Reserved for concepts with insufficient information to code with codable children: Secondary | ICD-10-CM

## 2016-06-04 DIAGNOSIS — E1165 Type 2 diabetes mellitus with hyperglycemia: Secondary | ICD-10-CM

## 2016-06-04 DIAGNOSIS — Z713 Dietary counseling and surveillance: Secondary | ICD-10-CM | POA: Insufficient documentation

## 2016-06-04 DIAGNOSIS — E118 Type 2 diabetes mellitus with unspecified complications: Secondary | ICD-10-CM

## 2016-06-04 NOTE — Patient Instructions (Signed)
Goal: 1. Get back to following the Plate Method. 2.  Cut out fast foods. 3.  Cut out meal before lying down. 5. Plan meals better for better choices. 6. Increase low carb vegetables and fresh fruit and whole grains.. 7. Lose 1-2 lbs per week. 8. Consider a food journal

## 2016-06-04 NOTE — Progress Notes (Signed)
  Medical Nutrition Therapy:  Appt start time: 0830 end time:  0845   Assessment:  Primary concerns today: DIabetes Type 2.  Last A1C was 7.7%, up from 6.8%.  Lost mom her May 30th. She admits to overeating due to all the stress she has been under. FBS 116-159 mg/dl. ON 500 mg of Metformin BID. She is ready to get back on track with eating and exercising. Gained 6 lbs since last visit.   Diet is excessive to meet her nutritional needs and control blood sugars and lose weight.     Learning Style:  Auditory  Visual  Hands on  Learning Readiness:  Ready  Change in progress   MEDICATIONS: See list   DIETARY INTAKE:    24-hr recall:  Breakfast: bacon and egg biscuit, flavored water Lunch: Burger from [employer],  Green tea Diet Supper: Smoothie- fruit, spinach and protein powder 16 oz  Usual physical activity: ADL but does some walking on her job. Willing to start walking on the treadmill.  Estimated energy needs: 1500 calories 170 g carbohydrates 112 g protein 42 g fat  Progress Towards Goal(s):  Some progress.   Nutritional Diagnosis:  NB-1.1 Food and nutrition-related knowledge deficit As related to Diabetes.  As evidenced by A1C 7.4%..    Intervention:  Nutrition counseling and diabetes education provided on disease, Carb counting, meal planning, MY Plate, portion sizes, low fat low sodium high fiber diet, benefits of exercise, signs/symptoms and treatment of hyper/hypoglycemial and prevention of complications and testing and using results..Low fiber and high fiber diet due to Diverticulitis.   Goal: 1. Get back to following the Plate Method. 2.  Cut out fast foods. 3.  Cut out meal before lying down. 5. Plan meals better for better choices. 6. Increase low carb vegetables and fresh fruit and whole grains.. 7. Lose 1-2 lbs per week. 8. Consider a food journal   Teaching Method Utilized:  Visual Auditory Hands on  Handouts given during visit include:  The  Plate Method  The Meal Plan Card  Barriers to learning/adherence to lifestyle change: None  Demonstrated degree of understanding via:  Teach Back   Monitoring/Evaluation:  Dietary intake, exercise, meal planning , and body weight in 1-3 month(s).

## 2016-06-09 MED FILL — MONTELUKAST SOD 10 MG TAB: 10 | 90 days supply | Qty: 90 | Fill #2

## 2016-06-09 MED FILL — TRUE METRIX GLUCOSE TEST ST: 50 days supply | Qty: 100 | Fill #0

## 2016-06-18 DIAGNOSIS — H5213 Myopia, bilateral: Secondary | ICD-10-CM | POA: Diagnosis not present

## 2016-06-18 DIAGNOSIS — E113292 Type 2 diabetes mellitus with mild nonproliferative diabetic retinopathy without macular edema, left eye: Secondary | ICD-10-CM | POA: Diagnosis not present

## 2016-06-18 DIAGNOSIS — E119 Type 2 diabetes mellitus without complications: Secondary | ICD-10-CM | POA: Diagnosis not present

## 2016-06-22 ENCOUNTER — Ambulatory Visit: Payer: 59 | Admitting: Internal Medicine

## 2016-06-24 MED FILL — BYDUREON 2 MG PEN INJECT: 2 | 84 days supply | Qty: 12 | Fill #2

## 2016-06-24 MED FILL — VALSARTAN-HCTZ 320-25 MG TA: 320-25 | 90 days supply | Qty: 90 | Fill #3

## 2016-06-24 MED FILL — JANUVIA 100 MG TABLET: 100 | 90 days supply | Qty: 90 | Fill #3

## 2016-06-24 MED FILL — AMLODIPINE BESYLATE 10 MG T: 10 | 90 days supply | Qty: 90 | Fill #3

## 2016-06-29 ENCOUNTER — Encounter (HOSPITAL_COMMUNITY): Payer: Self-pay | Admitting: Emergency Medicine

## 2016-06-29 ENCOUNTER — Emergency Department (HOSPITAL_COMMUNITY)
Admission: EM | Admit: 2016-06-29 | Discharge: 2016-06-29 | Disposition: A | Discharge status: Home / Self Care | Payer: 59 | Attending: Emergency Medicine | Admitting: Emergency Medicine

## 2016-06-29 ENCOUNTER — Emergency Department (HOSPITAL_COMMUNITY): Payer: 59

## 2016-06-29 DIAGNOSIS — Z87891 Personal history of nicotine dependence: Secondary | ICD-10-CM | POA: Diagnosis not present

## 2016-06-29 DIAGNOSIS — J45909 Unspecified asthma, uncomplicated: Secondary | ICD-10-CM | POA: Diagnosis not present

## 2016-06-29 DIAGNOSIS — R1012 Left upper quadrant pain: Secondary | ICD-10-CM | POA: Diagnosis not present

## 2016-06-29 DIAGNOSIS — R112 Nausea with vomiting, unspecified: Secondary | ICD-10-CM

## 2016-06-29 DIAGNOSIS — Z7982 Long term (current) use of aspirin: Secondary | ICD-10-CM | POA: Insufficient documentation

## 2016-06-29 DIAGNOSIS — Z79899 Other long term (current) drug therapy: Secondary | ICD-10-CM | POA: Diagnosis not present

## 2016-06-29 DIAGNOSIS — K2960 Other gastritis without bleeding: Secondary | ICD-10-CM | POA: Insufficient documentation

## 2016-06-29 DIAGNOSIS — E119 Type 2 diabetes mellitus without complications: Secondary | ICD-10-CM | POA: Insufficient documentation

## 2016-06-29 DIAGNOSIS — I10 Essential (primary) hypertension: Secondary | ICD-10-CM | POA: Diagnosis not present

## 2016-06-29 DIAGNOSIS — Z7984 Long term (current) use of oral hypoglycemic drugs: Secondary | ICD-10-CM | POA: Diagnosis not present

## 2016-06-29 DIAGNOSIS — T39395A Adverse effect of other nonsteroidal anti-inflammatory drugs [NSAID], initial encounter: Secondary | ICD-10-CM

## 2016-06-29 DIAGNOSIS — R1010 Upper abdominal pain, unspecified: Secondary | ICD-10-CM | POA: Diagnosis present

## 2016-06-29 DIAGNOSIS — K2970 Gastritis, unspecified, without bleeding: Secondary | ICD-10-CM | POA: Diagnosis not present

## 2016-06-29 LAB — COMPREHENSIVE METABOLIC PANEL
ALK PHOS: 48 U/L (ref 38–126)
ALT: 14 U/L (ref 14–54)
AST: 17 U/L (ref 15–41)
Albumin: 3.7 g/dL (ref 3.5–5.0)
Anion gap: 9 (ref 5–15)
BILIRUBIN TOTAL: 0.3 mg/dL (ref 0.3–1.2)
BUN: 14 mg/dL (ref 6–20)
CO2: 29 mmol/L (ref 22–32)
CREATININE: 0.49 mg/dL (ref 0.44–1.00)
Calcium: 9.1 mg/dL (ref 8.9–10.3)
Chloride: 99 mmol/L — ABNORMAL LOW (ref 101–111)
GFR calc Af Amer: >60 mL/min (ref >60)
Glucose, Bld: 172 mg/dL — ABNORMAL HIGH (ref 65–99)
Potassium: 3.2 mmol/L — ABNORMAL LOW (ref 3.5–5.1)
Sodium: 137 mmol/L (ref 135–145)
TOTAL PROTEIN: 7.9 g/dL (ref 6.5–8.1)

## 2016-06-29 LAB — URINALYSIS, ROUTINE W REFLEX MICROSCOPIC
BILIRUBIN URINE: NEGATIVE
GLUCOSE, UA: NEGATIVE mg/dL
Hgb urine dipstick: NEGATIVE
KETONES UR: NEGATIVE mg/dL
LEUKOCYTES UA: NEGATIVE
NITRITE: NEGATIVE
PROTEIN: NEGATIVE mg/dL
Specific Gravity, Urine: 1.01 (ref 1.005–1.030)
pH: 6.5 (ref 5.0–8.0)

## 2016-06-29 LAB — CBC
HCT: 37.7 % (ref 36.0–46.0)
Hemoglobin: 13 g/dL (ref 12.0–15.0)
MCH: 29.9 pg (ref 26.0–34.0)
MCHC: 34.5 g/dL (ref 30.0–36.0)
MCV: 86.7 fL (ref 78.0–100.0)
Platelets: 300 10*3/uL (ref 150–400)
RBC: 4.35 MIL/uL (ref 3.87–5.11)
RDW: 13.3 % (ref 11.5–15.5)
WBC: 7.7 10*3/uL (ref 4.0–10.5)

## 2016-06-29 LAB — LIPASE, BLOOD: Lipase: 19 U/L (ref 11–51)

## 2016-06-29 MED ORDER — ONDANSETRON 4 MG PO TBDP: 4.0000 mg | ORAL_TABLET | Freq: Three times a day (TID) | ORAL | Status: DC | PRN

## 2016-06-29 MED ORDER — GI COCKTAIL ~~LOC~~
30.0000 mL | Freq: Once | ORAL | Status: AC
  Administered 2016-06-29: 30 mL via ORAL
  Filled 2016-06-29: qty 30

## 2016-06-29 MED ORDER — KETOROLAC TROMETHAMINE 30 MG/ML IJ SOLN
30.0000 mg | Freq: Once | INTRAMUSCULAR | Status: AC
  Administered 2016-06-29: 30 mg via INTRAVENOUS
  Filled 2016-06-29: qty 1

## 2016-06-29 MED ORDER — DIATRIZOATE MEGLUMINE & SODIUM 66-10 % PO SOLN
ORAL | Status: AC
  Administered 2016-06-29: 14:00:00
  Filled 2016-06-29: qty 30

## 2016-06-29 MED ORDER — SODIUM CHLORIDE 0.9 % IV SOLN
Freq: Once | INTRAVENOUS | Status: AC
  Administered 2016-06-29: 15:00:00 via INTRAVENOUS

## 2016-06-29 MED ORDER — POTASSIUM CHLORIDE 10 MEQ/100ML IV SOLN
10.0000 meq | Freq: Once | INTRAVENOUS | Status: AC
  Administered 2016-06-29: 10 meq via INTRAVENOUS
  Filled 2016-06-29: qty 100

## 2016-06-29 MED ORDER — HYDROMORPHONE HCL 1 MG/ML IJ SOLN: 0.5000 mg | Freq: Once | INTRAMUSCULAR | Status: DC

## 2016-06-29 MED ORDER — ONDANSETRON HCL 4 MG/2ML IJ SOLN
4.0000 mg | Freq: Once | INTRAMUSCULAR | Status: AC
  Administered 2016-06-29: 4 mg via INTRAVENOUS
  Filled 2016-06-29: qty 2

## 2016-06-29 MED ORDER — TRAMADOL HCL 50 MG PO TABS: 50.0000 mg | ORAL_TABLET | Freq: Four times a day (QID) | ORAL | Status: DC | PRN

## 2016-06-29 MED ORDER — HYDROMORPHONE HCL 1 MG/ML IJ SOLN
1.0000 mg | Freq: Once | INTRAMUSCULAR | Status: AC
  Administered 2016-06-29: 1 mg via INTRAVENOUS
  Filled 2016-06-29: qty 1

## 2016-06-29 MED ORDER — IOPAMIDOL (ISOVUE-300) INJECTION 61%
100.0000 mL | Freq: Once | INTRAVENOUS | Status: AC | PRN
  Administered 2016-06-29: 100 mL via INTRAVENOUS

## 2016-06-29 MED ORDER — SODIUM CHLORIDE 0.9 % IV BOLUS (SEPSIS)
1000.0000 mL | Freq: Once | INTRAVENOUS | Status: AC
  Administered 2016-06-29: 1000 mL via INTRAVENOUS

## 2016-06-29 MED ORDER — PANTOPRAZOLE SODIUM 40 MG PO TBEC: 40.0000 mg | DELAYED_RELEASE_TABLET | Freq: Every day | ORAL | Status: DC

## 2016-06-29 NOTE — Discharge Instructions (Signed)
If you were given medicines take as directed.  If you are on coumadin or contraceptives realize their levels and effectiveness is altered by many different medicines.  If you have any reaction (rash, tongues swelling, other) to the medicines stop taking and see a physician.    Call make appointment to follow-up with the gastroenterologist. Stop taking all NSAIDs. Take Protonix as directed. Follow-up with her primary physician regarding alternative pain medication for your arthritis. Return immediate for worsening pain, fever, vomiting blood, lightheadedness, chest pain or syncope.  Please follow up as directed and return to the ER or see a physician for new or worsening symptoms.  Thank you. Filed Vitals:   06/29/16 1231 06/29/16 1338 06/29/16 1343  BP: 167/78 155/80 146/73  Pulse: 81 84 79  Temp: 97.8 F (36.6 C)    TempSrc: Temporal    Resp: 16 13 10   Height: 5\' 3"  (1.6 m)    Weight: 250 lb (113.399 kg)    SpO2: 100% 100% 99%      Gastritis, Adult Gastritis is soreness and swelling (inflammation) of the lining of the stomach. Gastritis can develop as a sudden onset (acute) or long-term (chronic) condition. If gastritis is not treated, it can lead to stomach bleeding and ulcers. CAUSES  Gastritis occurs when the stomach lining is weak or damaged. Digestive juices from the stomach then inflame the weakened stomach lining. The stomach lining may be weak or damaged due to viral or bacterial infections. One common bacterial infection is the Helicobacter pylori infection. Gastritis can also result from excessive alcohol consumption, taking certain medicines, or having too much acid in the stomach.  SYMPTOMS  In some cases, there are no symptoms. When symptoms are present, they may include:  Pain or a burning sensation in the upper abdomen.  Nausea.  Vomiting.  An uncomfortable feeling of fullness after eating. DIAGNOSIS  Your caregiver may suspect you have gastritis based on your  symptoms and a physical exam. To determine the cause of your gastritis, your caregiver may perform the following:  Blood or stool tests to check for the H pylori bacterium.  Gastroscopy. A thin, flexible tube (endoscope) is passed down the esophagus and into the stomach. The endoscope has a light and camera on the end. Your caregiver uses the endoscope to view the inside of the stomach.  Taking a tissue sample (biopsy) from the stomach to examine under a microscope. TREATMENT  Depending on the cause of your gastritis, medicines may be prescribed. If you have a bacterial infection, such as an H pylori infection, antibiotics may be given. If your gastritis is caused by too much acid in the stomach, H2 blockers or antacids may be given. Your caregiver may recommend that you stop taking aspirin, ibuprofen, or other nonsteroidal anti-inflammatory drugs (NSAIDs). HOME CARE INSTRUCTIONS  Only take over-the-counter or prescription medicines as directed by your caregiver.  If you were given antibiotic medicines, take them as directed. Finish them even if you start to feel better.  Drink enough fluids to keep your urine clear or pale yellow.  Avoid foods and drinks that make your symptoms worse, such as:  Caffeine or alcoholic drinks.  Chocolate.  Peppermint or mint flavorings.  Garlic and onions.  Spicy foods.  Citrus fruits, such as oranges, lemons, or limes.  Tomato-based foods such as sauce, chili, salsa, and pizza.  Fried and fatty foods.  Eat small, frequent meals instead of large meals. SEEK IMMEDIATE MEDICAL CARE IF:   You have black  or dark red stools.  You vomit blood or material that looks like coffee grounds.  You are unable to keep fluids down.  Your abdominal pain gets worse.  You have a fever.  You do not feel better after 1 week.  You have any other questions or concerns. MAKE SURE YOU:  Understand these instructions.  Will watch your condition.  Will  get help right away if you are not doing well or get worse.   This information is not intended to replace advice given to you by your health care provider. Make sure you discuss any questions you have with your health care provider.   Document Released: 11/24/2001 Document Revised: 05/31/2012 Document Reviewed: 01/13/2012 Elsevier Interactive Patient Education [employer].

## 2016-06-29 NOTE — ED Notes (Signed)
Pt ambulating independently w/ steady gait on d/c in no acute distress, A&Ox4. D/c instructions reviewed w/ pt and family - pt and family deny any further questions or concerns at present. Rx given x2  

## 2016-06-29 NOTE — ED Notes (Signed)
Patient transported to CT 

## 2016-06-29 NOTE — ED Notes (Signed)
Patient complaining of left upper abdominal pain and vomiting starting this morning.

## 2016-06-29 NOTE — ED Notes (Addendum)
Pt reports left sided abd pain that began that began this morning, admits to hx of diverticulitis x50months ago and reports this pain feels similar. Admits to n/v this morning as well. Pt in no acute distress, A&Ox4, pleasant and cooperative.

## 2016-06-29 NOTE — ED Provider Notes (Signed)
CSN: [id number]     Arrival date & time 06/29/16  1226 History   First MD Initiated Contact with Patient 06/29/16 1240     Chief Complaint  Patient presents with  . Emesis  . Abdominal Pain     (Consider location/radiation/quality/duration/timing/severity/associated sxs/prior Treatment) HPI Comments: 55 year old female with history of obesity, high blood pressure, clinical diverticulitis 6 months ago that resolved with antibiotics presents with recurrent left more upper abdominal discomfort and recurrent vomiting since this morning. Feels overall similar. Patient colonoscopy which was unremarkable recently. No blood in the stools. No fevers   Patient is a 55 y.o. female presenting with vomiting and abdominal pain. The history is provided by the patient.  Emesis Associated symptoms: abdominal pain   Associated symptoms: no chills and no headaches   Abdominal Pain Associated symptoms: nausea and vomiting   Associated symptoms: no chest pain, no chills, no dysuria, no fever and no shortness of breath     Past Medical History  Diagnosis Date  . Hypertension   . Diabetes mellitus   . Anxiety   . Asthma     diagnosed as an adult   Past Surgical History  Procedure Laterality Date  . Abdominal hysterectomy    . Cholecystectomy    . Cesarean section    . Sinus exploration    . Carpal tunnel release    . Wrist surgery    . Trigger finger release     Family History  Problem Relation Age of Onset  . Hypertension Mother   . Diabetes Mother   . Stroke Mother    Social History  Substance Use Topics  . Smoking status: Former Research scientist (life sciences)  . Smokeless tobacco: Never Used  . Alcohol Use: No   OB History    No data available     Review of Systems  Constitutional: Positive for appetite change. Negative for fever and chills.  HENT: Negative for congestion.   Eyes: Negative for visual disturbance.  Respiratory: Negative for shortness of breath.   Cardiovascular: Negative for chest  pain.  Gastrointestinal: Positive for nausea, vomiting and abdominal pain.  Genitourinary: Negative for dysuria and flank pain.  Musculoskeletal: Negative for back pain, neck pain and neck stiffness.  Skin: Negative for rash.  Neurological: Negative for light-headedness and headaches.      Allergies  Barium-containing compounds; Statins; and Sulfa antibiotics  Home Medications   Prior to Admission medications   Medication Sig Start Date End Date Taking? Authorizing Provider  acetaminophen (TYLENOL) 500 MG tablet Take 1,000 mg by mouth at bedtime as needed. For pain   Yes Historical Provider, MD  albuterol (PROVENTIL HFA;VENTOLIN HFA) 108 (90 Base) MCG/ACT inhaler Inhale 2 puffs into the lungs every 4 (four) hours as needed for wheezing or shortness of breath. 02/02/16  Yes Kristen N Ward, DO  ALPRAZolam (XANAX) 0.5 MG tablet Take 0.5 mg by mouth at bedtime as needed for anxiety.   Yes Historical Provider, MD  amLODipine (NORVASC) 10 MG tablet Take 10 mg by mouth daily.   Yes Historical Provider, MD  aspirin EC 81 MG tablet Take 81 mg by mouth daily.   Yes Historical Provider, MD  Azelastine-Fluticasone (DYMISTA) 137-50 MCG/ACT SUSP Place 1 spray into the nose 2 (two) times daily as needed (nasal congestion).   Yes Historical Provider, MD  beclomethasone (QVAR) 40 MCG/ACT inhaler Inhale 1 puff into the lungs 2 (two) times daily as needed (shortness of breath).    Yes Historical Provider, MD  DUEXIS 800-26.6  MG TABS Take 1 tablet by mouth every 8 (eight) hours as needed (pain/inflammation). Reported on 06/01/2016 12/19/15  Yes Historical Provider, MD  estrogens, conjugated, (PREMARIN) 0.625 MG tablet Take 0.625 mg by mouth daily.    Yes Historical Provider, MD  Exenatide ER (BYDUREON) 2 MG PEN Inject 2 mg into the skin every Wednesday.    Yes Historical Provider, MD  fexofenadine (ALLEGRA) 180 MG tablet Take 180 mg by mouth daily.   Yes Historical Provider, MD  magnesium oxide (MAG-OX) 400 MG  tablet Take 400 mg by mouth 2 (two) times daily.   Yes Historical Provider, MD  metFORMIN (GLUCOPHAGE) 500 MG tablet Take 500 mg by mouth 2 (two) times daily. 01/14/16  Yes Historical Provider, MD  metoprolol succinate (TOPROL-XL) 50 MG 24 hr tablet Take 1 tablet by mouth daily. 05/26/16  Yes Historical Provider, MD  montelukast (SINGULAIR) 10 MG tablet Take 10 mg by mouth at bedtime.   Yes Historical Provider, MD  ranitidine (ZANTAC) 150 MG tablet Take 150 mg by mouth 2 (two) times daily.   Yes Historical Provider, MD  sitaGLIPtin (JANUVIA) 100 MG tablet Take 100 mg by mouth daily.   Yes Historical Provider, MD  valsartan-hydrochlorothiazide (DIOVAN-HCT) 320-25 MG tablet Take 1 tablet by mouth daily.   Yes Historical Provider, MD  guaiFENesin-codeine 100-10 MG/5ML syrup Take 5-10 mLs by mouth every 6 (six) hours as needed for cough. Patient not taking: Reported on 06/01/2016 02/02/16   Kristen N Ward, DO  ondansetron (ZOFRAN ODT) 4 MG disintegrating tablet Take 1 tablet (4 mg total) by mouth every 8 (eight) hours as needed for nausea or vomiting. Patient not taking: Reported on 06/01/2016 02/02/16   Kristen N Ward, DO   BP 146/73 mmHg  Pulse 79  Temp(Src) 97.8 F (36.6 C) (Temporal)  Resp 10  Ht 5\' 3"  (1.6 m)  Wt 250 lb (113.399 kg)  BMI 44.30 kg/m2  SpO2 99% Physical Exam  Constitutional: She is oriented to person, place, and time. She appears well-developed and well-nourished.  HENT:  Head: Normocephalic and atraumatic.  Eyes: Conjunctivae are normal. Right eye exhibits no discharge. Left eye exhibits no discharge.  Neck: Normal range of motion. Neck supple. No tracheal deviation present.  Cardiovascular: Normal rate and regular rhythm.   Pulmonary/Chest: Effort normal and breath sounds normal.  Abdominal: Soft. She exhibits no distension. There is tenderness (mild left mid and left upper quadrant no peritonitis). There is no guarding.  Musculoskeletal: She exhibits no edema.   Neurological: She is alert and oriented to person, place, and time.  Skin: Skin is warm. No rash noted.  Psychiatric: She has a normal mood and affect.  Nursing note and vitals reviewed.   ED Course  Procedures (including critical care time) Labs Review Labs Reviewed  COMPREHENSIVE METABOLIC PANEL - Abnormal; Notable for the following:    Potassium 3.2 (*)    Chloride 99 (*)    Glucose, Bld 172 (*)    All other components within normal limits  URINALYSIS, ROUTINE W REFLEX MICROSCOPIC (NOT AT [facility]) - Abnormal; Notable for the following:    Color, Urine STRAW (*)    All other components within normal limits  LIPASE, BLOOD  CBC    Imaging Review No results found. I have personally reviewed and evaluated these images and lab results as part of my medical decision-making.   EKG Interpretation   Date/Time:  Monday June 29 2016 12:52:14 EDT Ventricular Rate:  75 PR Interval:    QRS  Duration: 105 QT Interval:  426 QTC Calculation: 476 R Axis:   74 Text Interpretation:  Sinus rhythm Confirmed by Evann Erazo MD, Stefana Lodico [id number])  on 06/29/2016 1:38:24 PM      MDM   Final diagnoses:  Left sided abdominal pain  Non-intractable vomiting with nausea, vomiting of unspecified type   Patient well-appearing presents with recurrent vomiting and left abdominal pain. Concern for diverticulitis or colitis. Possibly kidney stone on differential as well. Plan for blood work, IV fluids, pain meds and CT scan. Patient's care signed at follow-up CT scan results and final disposition.  Results and differential diagnosis were discussed with the patient/parent/guardian. Xrays were independently reviewed by myself.  Close follow up outpatient was discussed, comfortable with the plan.   Medications  potassium chloride 10 mEq in 100 mL IVPB (0 mEq Intravenous Paused 06/29/16 1543)  sodium chloride 0.9 % bolus 1,000 mL (0 mLs Intravenous Stopped 06/29/16 1521)  ondansetron (ZOFRAN) injection 4 mg (4 mg  Intravenous Given 06/29/16 1334)  HYDROmorphone (DILAUDID) injection 1 mg (1 mg Intravenous Given 06/29/16 1334)  diatrizoate meglumine-sodium (GASTROGRAFIN) 66-10 % solution (  Given by Other 06/29/16 1353)  ketorolac (TORADOL) 30 MG/ML injection 30 mg (30 mg Intravenous Given 06/29/16 1520)  0.9 %  sodium chloride infusion ( Intravenous Paused 06/29/16 1543)  iopamidol (ISOVUE-300) 61 % injection 100 mL (100 mLs Intravenous Contrast Given 06/29/16 1602)    Filed Vitals:   06/29/16 1231 06/29/16 1338 06/29/16 1343 06/29/16 1603  BP: 167/78 155/80 146/73 141/78  Pulse: 81 84 79 85  Temp: 97.8 F (36.6 C)     TempSrc: Temporal     Resp: 16 13 10 16   Height: 5\' 3"  (1.6 m)     Weight: 250 lb (113.399 kg)     SpO2: 100% 100% 99% 100%    Final diagnoses:  Left sided abdominal pain  Non-intractable vomiting with nausea, vomiting of unspecified type      Elnora Morrison, MD 06/29/16 [phone]

## 2016-06-30 MED FILL — PANTOPRAZOLE SOD DR 40 MG T: 40 | 30 days supply | Qty: 30 | Fill #0

## 2016-06-30 MED FILL — ALPRAZolam 0.5 MG TABS: 0.5 | 45 days supply | Qty: 90 | Fill #0

## 2016-06-30 MED FILL — XIIDRA 5% EYE DROPS: 5 | 30 days supply | Qty: 60 | Fill #0

## 2016-06-30 MED FILL — traMADol HCL 50 MG TABS: 50 | 4 days supply | Qty: 10 | Fill #0

## 2016-07-20 DIAGNOSIS — M79644 Pain in right finger(s): Secondary | ICD-10-CM | POA: Diagnosis not present

## 2016-07-20 DIAGNOSIS — M180 Bilateral primary osteoarthritis of first carpometacarpal joints: Secondary | ICD-10-CM | POA: Diagnosis not present

## 2016-07-22 DIAGNOSIS — R590 Localized enlarged lymph nodes: Secondary | ICD-10-CM | POA: Diagnosis not present

## 2016-07-23 DIAGNOSIS — E119 Type 2 diabetes mellitus without complications: Secondary | ICD-10-CM | POA: Diagnosis not present

## 2016-07-23 DIAGNOSIS — E559 Vitamin D deficiency, unspecified: Secondary | ICD-10-CM | POA: Diagnosis not present

## 2016-07-23 DIAGNOSIS — R6882 Decreased libido: Secondary | ICD-10-CM | POA: Diagnosis not present

## 2016-07-23 DIAGNOSIS — I10 Essential (primary) hypertension: Secondary | ICD-10-CM | POA: Diagnosis not present

## 2016-07-23 DIAGNOSIS — Z0389 Encounter for observation for other suspected diseases and conditions ruled out: Secondary | ICD-10-CM | POA: Diagnosis not present

## 2016-07-23 DIAGNOSIS — E8941 Symptomatic postprocedural ovarian failure: Secondary | ICD-10-CM | POA: Diagnosis not present

## 2016-07-23 DIAGNOSIS — R5383 Other fatigue: Secondary | ICD-10-CM | POA: Diagnosis not present

## 2016-08-07 ENCOUNTER — Encounter: Payer: Self-pay | Admitting: Internal Medicine

## 2016-08-07 ENCOUNTER — Ambulatory Visit (INDEPENDENT_AMBULATORY_CARE_PROVIDER_SITE_OTHER): Payer: 59 | Admitting: Internal Medicine

## 2016-08-07 VITALS — BP 122/66 | HR 82 | Ht 63.0 in | Wt 248.0 lb

## 2016-08-07 DIAGNOSIS — I10 Essential (primary) hypertension: Secondary | ICD-10-CM | POA: Diagnosis not present

## 2016-08-07 NOTE — Progress Notes (Signed)
Cardiology Office Note   Date:  08/07/2016   ID:  [name], DOB 12/13/1961, MRN [id number]  PCP:  Glo Herring., MD  Cardiologist:   Dorris Carnes, MD    Pt is self referred for cardiac eval     History of Present Illness: [name] is a 55 y.o. female with a history of HTN, DM, obesity, diverticulitis, gastritis  Works in ER as clerk   Pt feels like she needs a cardiologist  [place] of CHF in father, HTN and DM  Denies CP  Cleans, washes   Vacuuming  No problems  Does some walking at work   Seeing Dr Anastasio Champion now  Wants to make changes in life   Max wt was 48 at one time       Outpatient Medications Prior to Visit  Medication Sig Dispense Refill  . acetaminophen (TYLENOL) 500 MG tablet Take 1,000 mg by mouth at bedtime as needed. For pain    . ALPRAZolam (XANAX) 0.5 MG tablet Take 0.5 mg by mouth at bedtime as needed for anxiety.    Marland Kitchen amLODipine (NORVASC) 10 MG tablet Take 10 mg by mouth daily.    Marland Kitchen aspirin EC 81 MG tablet Take 81 mg by mouth daily.    . Azelastine-Fluticasone (DYMISTA) 137-50 MCG/ACT SUSP Place 1 spray into the nose 2 (two) times daily as needed (nasal congestion).    Marland Kitchen estrogens, conjugated, (PREMARIN) 0.625 MG tablet Take 0.625 mg by mouth daily.     . Exenatide ER (BYDUREON) 2 MG PEN Inject 2 mg into the skin every Wednesday.     . fexofenadine (ALLEGRA) 180 MG tablet Take 180 mg by mouth daily.    . magnesium oxide (MAG-OX) 400 MG tablet Take 400 mg by mouth 2 (two) times daily.    . metFORMIN (GLUCOPHAGE) 500 MG tablet Take 500 mg by mouth 2 (two) times daily.  3  . metoprolol succinate (TOPROL-XL) 50 MG 24 hr tablet Take 1 tablet by mouth daily.  11  . montelukast (SINGULAIR) 10 MG tablet Take 10 mg by mouth at bedtime.    . ranitidine (ZANTAC) 150 MG tablet Take 150 mg by mouth 2 (two) times daily.    . sitaGLIPtin (JANUVIA) 100 MG tablet Take 100 mg by mouth daily.    . valsartan-hydrochlorothiazide (DIOVAN-HCT) 320-25 MG tablet Take  1 tablet by mouth daily.    Marland Kitchen albuterol (PROVENTIL HFA;VENTOLIN HFA) 108 (90 Base) MCG/ACT inhaler Inhale 2 puffs into the lungs every 4 (four) hours as needed for wheezing or shortness of breath. (Patient not taking: Reported on 08/07/2016) 1 Inhaler 0  . beclomethasone (QVAR) 40 MCG/ACT inhaler Inhale 1 puff into the lungs 2 (two) times daily as needed (shortness of breath).     Marland Kitchen guaiFENesin-codeine 100-10 MG/5ML syrup Take 5-10 mLs by mouth every 6 (six) hours as needed for cough. (Patient not taking: Reported on 06/01/2016) 120 mL 0  . ondansetron (ZOFRAN ODT) 4 MG disintegrating tablet Take 1 tablet (4 mg total) by mouth every 8 (eight) hours as needed for nausea or vomiting. 15 tablet 0  . pantoprazole (PROTONIX) 40 MG tablet Take 1 tablet (40 mg total) by mouth daily. 30 tablet 0  . traMADol (ULTRAM) 50 MG tablet Take 1 tablet (50 mg total) by mouth every 6 (six) hours as needed for moderate pain. 10 tablet 0   No facility-administered medications prior to visit.      Allergies:   Barium-containing compounds; Statins; and Sulfa  antibiotics   Past Medical History:  Diagnosis Date  . Anxiety   . Asthma    diagnosed as an adult  . Diabetes mellitus   . Hypertension     Past Surgical History:  Procedure Laterality Date  . ABDOMINAL HYSTERECTOMY    . CARPAL TUNNEL RELEASE    . CESAREAN SECTION    . CHOLECYSTECTOMY    . SINUS EXPLORATION    . TRIGGER FINGER RELEASE    . WRIST SURGERY       Social History:  The patient  reports that she has quit smoking. She has never used smokeless tobacco. She reports that she does not drink alcohol or use drugs.   Family History:  The patient's family history includes Diabetes in her mother; Hypertension in her mother; Stroke in her mother.    ROS:  Please see the history of present illness. All other systems are reviewed and  Negative to the above problem except as noted.    PHYSICAL EXAM: VS:  BP 122/66   Pulse 82   Ht 5\' 3"  (1.6 m)    Wt 248 lb (112.5 kg)   SpO2 96%   BMI 43.93 kg/m   GEN: Morbidly obese 55 yo  in no acute distress  HEENT: normal  Neck: no JVD, carotid bruits, or masses Cardiac: RRR; no murmurs, rubs, or gallops,no edema  Respiratory:  clear to auscultation bilaterally, normal work of breathing  Rare uppper airway wheeze  GI: soft, nontender, nondistended, + BS  No hepatomegaly  MS: no deformity Moving all extremities   Skin: warm and dry, no rash Neuro:  Strength and sensation are intact Psych: euthymic mood, full affect   EKG:  EKG is ordered today.   Lipid Panel    Component Value Date/Time   CHOL 173 06/04/2015 1130   TRIG 133 06/04/2015 1130   HDL 61 06/04/2015 1130   CHOLHDL 2.8 06/04/2015 1130   VLDL 27 06/04/2015 1130   LDLCALC 85 06/04/2015 1130      Wt Readings from Last 3 Encounters:  08/07/16 248 lb (112.5 kg)  06/29/16 250 lb (113.4 kg)  06/04/16 250 lb (113.4 kg)      ASSESSMENT AND PLAN:  Pt is a 55 yo with no history of CAD  Hx of DM and HTN   Making life changes  Wanted to be seen  No symptoms to suggest angina  BP is OK 1.  CT scan in [place] showed mild atherosclerotic plaquing of aorta  ? If she should be on meds  Was up to 380 in past  I will get lipids from Dr Anastasio Champion and contact pt  2.  HTN  BP is good  Will get electrolytes as K has been a little low   She is on 25 HCTZ  May do better on 12.5  3.  Morbid obesity  She has lost wt in past  Knows what to do  Plans to increase exercise      Signed, Dorris Carnes, MD  08/07/2016 9:28 AM    [address], [place], [place]  [id number] Phone: [phone]; Fax: [phone]

## 2016-08-07 NOTE — Patient Instructions (Signed)
Your physician wants you to follow-up in: 1 year Dr Ross You will receive a reminder letter in the mail two months in advance. If you don't receive a letter, please call our office to schedule the follow-up appointment.    Your physician recommends that you continue on your current medications as directed. Please refer to the Current Medication list given to you today.     Thank you for choosing [facility] !        

## 2016-08-13 DIAGNOSIS — E119 Type 2 diabetes mellitus without complications: Secondary | ICD-10-CM | POA: Diagnosis not present

## 2016-08-13 DIAGNOSIS — I10 Essential (primary) hypertension: Secondary | ICD-10-CM | POA: Diagnosis not present

## 2016-08-13 DIAGNOSIS — E559 Vitamin D deficiency, unspecified: Secondary | ICD-10-CM | POA: Diagnosis not present

## 2016-08-27 MED FILL — metFORMIN HCL 500 MG TABS: 500 | 90 days supply | Qty: 180 | Fill #0

## 2016-08-27 MED FILL — METOPROLOL SUCC ER 50 MG TA: 50 | 90 days supply | Qty: 90 | Fill #5

## 2016-09-01 ENCOUNTER — Telehealth: Payer: Self-pay

## 2016-09-01 DIAGNOSIS — E785 Hyperlipidemia, unspecified: Secondary | ICD-10-CM

## 2016-09-01 DIAGNOSIS — Z713 Dietary counseling and surveillance: Secondary | ICD-10-CM | POA: Diagnosis not present

## 2016-09-01 DIAGNOSIS — E559 Vitamin D deficiency, unspecified: Secondary | ICD-10-CM | POA: Diagnosis not present

## 2016-09-01 DIAGNOSIS — I10 Essential (primary) hypertension: Secondary | ICD-10-CM | POA: Diagnosis not present

## 2016-09-01 DIAGNOSIS — E8941 Symptomatic postprocedural ovarian failure: Secondary | ICD-10-CM | POA: Diagnosis not present

## 2016-09-01 DIAGNOSIS — Z7189 Other specified counseling: Secondary | ICD-10-CM | POA: Diagnosis not present

## 2016-09-01 MED FILL — ESTRADIOL 0.025 MG PATCH: 0.025 | 28 days supply | Qty: 8 | Fill #0

## 2016-09-01 NOTE — Telephone Encounter (Signed)
Mailed lab slip for fasting lipids to pt's home,also left her vm

## 2016-09-02 ENCOUNTER — Other Ambulatory Visit: Payer: Self-pay | Admitting: *Deleted

## 2016-09-02 ENCOUNTER — Encounter: Payer: Self-pay | Admitting: *Deleted

## 2016-09-02 VITALS — BP 122/70 | HR 83 | Ht 63.0 in | Wt 247.9 lb

## 2016-09-02 DIAGNOSIS — E1140 Type 2 diabetes mellitus with diabetic neuropathy, unspecified: Secondary | ICD-10-CM

## 2016-09-02 DIAGNOSIS — E119 Type 2 diabetes mellitus without complications: Secondary | ICD-10-CM

## 2016-09-02 DIAGNOSIS — J4520 Mild intermittent asthma, uncomplicated: Secondary | ICD-10-CM

## 2016-09-02 DIAGNOSIS — J45909 Unspecified asthma, uncomplicated: Secondary | ICD-10-CM | POA: Insufficient documentation

## 2016-09-02 LAB — POCT CBG (FASTING - GLUCOSE)-MANUAL ENTRY: Glucose Fasting, POC: 122 mg/dL — AB (ref 70–99)

## 2016-09-02 LAB — POCT GLYCOSYLATED HEMOGLOBIN (HGB A1C): HEMOGLOBIN A1C: 7.6

## 2016-09-02 NOTE — Patient Outreach (Addendum)
[facility]) Care Management   09/02/2016  REMEDI STANKUS January 01, 1961 [id number]  [name] is an 55 y.o. female who presents to the [place] Management office for routine Link To Wellness follow up for self -management assistance with Type II DM, HTN, hyperlipidemia and obesity.  Subjective: Pam says she started seeing Dr. Anastasio Champion on 07/23/16 for his healthy lifestyle program, Optimal Health. She says he wants her Hgb A1C to be 5.2%. She says Dr. Anastasio Champion did hormone levels and her testosterone level was low so she will start weekly injections. He also put her on a estrogen patch. She says she is now walking every day on the treadmill in the gym at [facility] after she gets off work for 15 minutes as part of Dr. Lanice Shirts program. She will eventually work with Jinny Blossom, Dr. Lanice Shirts personal trainer,  to learn how to use the gym equipment for strength training. Says she is using My Fitness Pal and [employer] (recommended by Dr. Anastasio Champion) phone apps to help her with tracking calories and activity.  She says she saw a cardiologist, Dr. Dorris Carnes, on 8/25 to establish care; she denies any current heart issues.  When asked, she says she went to the [facility] emergency department on 06/29/16 due to sever nausea and vomiting with left sided abdominal pain and was diagnosed with a viral GI bug after all tests and CT of abdomen were negative.  Objective:   Review of Systems  Constitutional: Negative.     Physical Exam  Constitutional: She is oriented to person, place, and time. She appears well-developed and well-nourished.  Cardiovascular: Normal rate and regular rhythm.   Respiratory: Effort normal.  Neurological: She is alert and oriented to person, place, and time.  Skin: Skin is warm and dry.  Psychiatric: She has a normal mood and affect. Her behavior is normal. Judgment normal.   Filed Weights   09/02/16 0836  Weight: 247 lb  14.4 oz (112.4 kg)   Vitals:   09/02/16 0836  BP: 122/70  Pulse: 83   Premeal POC CBG= 122 POC Hgb A1C= 7.6%  Encounter Medications:   Outpatient Encounter Prescriptions as of 09/02/2016  Medication Sig Note  . acetaminophen (TYLENOL) 500 MG tablet Take 1,000 mg by mouth at bedtime as needed. For pain   . ALPRAZolam (XANAX) 0.5 MG tablet Take 0.5 mg by mouth at bedtime as needed for anxiety.   Marland Kitchen amLODipine (NORVASC) 10 MG tablet Take 10 mg by mouth daily.   Marland Kitchen aspirin EC 81 MG tablet Take 81 mg by mouth daily.   . Azelastine-Fluticasone (DYMISTA) 137-50 MCG/ACT SUSP Place 1 spray into the nose 2 (two) times daily as needed (nasal congestion).   . Exenatide ER (BYDUREON) 2 MG PEN Inject 2 mg into the skin every Wednesday.    . fexofenadine (ALLEGRA) 180 MG tablet Take 180 mg by mouth daily.   . magnesium oxide (MAG-OX) 400 MG tablet Take 400 mg by mouth 2 (two) times daily.   . metFORMIN (GLUCOPHAGE) 500 MG tablet Take 500 mg by mouth 2 (two) times daily.   . metoprolol succinate (TOPROL-XL) 50 MG 24 hr tablet Take 1 tablet by mouth daily.   . montelukast (SINGULAIR) 10 MG tablet Take 10 mg by mouth at bedtime.   . ranitidine (ZANTAC) 150 MG tablet Take 150 mg by mouth 2 (two) times daily.   . sitaGLIPtin (JANUVIA) 100 MG tablet Take 100 mg by mouth daily.   Marland Kitchen  sodium chloride (OCEAN) 0.65 % SOLN nasal spray Place 1 spray into both nostrils as needed for congestion.   Marland Kitchen testosterone enanthate (DELATESTRYL) 200 MG/ML injection Inject into the muscle every 7 (fourteen) days. 09/02/2016: Takes weekly  . valsartan-hydrochlorothiazide (DIOVAN-HCT) 320-25 MG tablet Take 1 tablet by mouth daily. 09/02/2016: Takes every other day as she gets neck cramps if she takes everyday  . estrogens, conjugated, (PREMARIN) 0.625 MG tablet Take 0.625 mg by mouth daily.     No facility-administered encounter medications on file as of 09/02/2016.     Functional Status:   In your present state of health, do you  have any difficulty performing the following activities: 06/01/2016 10/21/2015  Hearing? N N  Vision? N N  Difficulty concentrating or making decisions? N N  Walking or climbing stairs? N N  Dressing or bathing? N N  Doing errands, shopping? N N  Some recent data might be hidden    Fall/Depression Screening:    PHQ 2/9 Scores 06/04/2016 06/01/2016 01/06/2016 01/06/2016 11/04/2015 06/10/2015 04/26/2015  PHQ - 2 Score 0 0 0 0 0 0 0    Assessment:   [employer] employee and Link To Wellness member with Type II DM, HTN, hyperlipidemia and morbid obesity now engaged in Dr. Lanice Shirts Optimal [facility] program to assist with healthy lifestyle behaviors.   Plan:  [facility] CM Care Plan Problem One   Flowsheet Row Most Recent Value  Care Plan Problem One Patient with morbid obesity (BMI= 44), Type II DM, HTN and hyperlipidemia, now only meeting all treatment targets for HTN with BP readings consistently <140/<90, with slightly improved glycemic control as evidenced by Hgb A1C= 7.6% (prior Hgb A1C= 7.7%), hyperlipidemia- intolerant to statins, lipid panel on 06/01/16 with slightly elevated total cholesterol at 202 and LDL at 101 ( prior panel was normal)  Role Documenting the Problem One  Care Management Coordinator  Care Plan for Problem One  Active  THN Long Term Goal (31-90 days) Ongoing good control of HTN as evidenced by BP meeting treatment targets of <140/<90 for all readings, improved control of DM as evidenced by  Hgb A1C <7.0% with  blood sugars meeting pre and post meal target >75% of the time, improved control of hyperlipidemia as evidenced by normal lipid profile at next assessment and evidence of weight loss or no weight gain at next assessment  THN Long Term Goal Start Date  09/02/16  Interventions for Problem One Long Term Goal Reviewed health history including ED visit in July, discussed options of E-visit or video visit in the future for symptoms appropriate for such visits, reviewed medications and  assessed adherence, discussed DM medications of Metformin, Januvia and Bydureon, reinforced importance of taking all medications as prescribed, reviewed role of low dose aspirin in the treatment of diabetes and reviewed signs and symptoms of stroke and heart attack and action plan if symptoms occur, congratulated Pam on keeping her appointment with the  dietician to assist with weight management and on enrolling in Dr. Lanice Shirts Optimal Health program, positive reinforcement given to  [facility] for exercising (walking on the treadmill) in the [facility] exercise room after work,  reviewed Pam's blood sugar readings/glucose log, reviewed recommended targets for pre-meal and post-meal, assessed POC  CBG and Hgb A1C and discussed results and correlation of Hgb A1C to estimated average glucose, reviewed results of lab results done at Dr. Nolon Rod office on 06/01/16, reviewed past strategies/behavior changes that resulted in weight loss success and improved health, reviewed recommended health  maintenance checks and ensured she is up to date, discussed changes to the Link To Wellness program for 2018, arranged for Link To Wellness follow up in December     RNCM to fax today's office visit note to Dr. Gerarda Fraction. [facility] will meet quarterly and as needed with patient per Link To Wellness program guidelines to assist with Type II DM, HTN, hyperlipidemia and obesity self-management and assess patient's progress toward mutually set goals. Barrington Ellison RN,CCM,CDE [place] Management Coordinator Link To Wellness Office Phone [phone] Office Fax [phone]

## 2016-09-04 ENCOUNTER — Other Ambulatory Visit (HOSPITAL_COMMUNITY)
Admission: RE | Admit: 2016-09-04 | Discharge: 2016-09-04 | Disposition: A | Discharge status: Home / Self Care | Payer: 59 | Source: Other Acute Inpatient Hospital | Attending: Internal Medicine | Admitting: Internal Medicine

## 2016-09-04 ENCOUNTER — Encounter: Payer: 59 | Attending: Internal Medicine | Admitting: Nutrition

## 2016-09-04 VITALS — Ht 63.0 in | Wt 249.6 lb

## 2016-09-04 DIAGNOSIS — E669 Obesity, unspecified: Secondary | ICD-10-CM

## 2016-09-04 DIAGNOSIS — E785 Hyperlipidemia, unspecified: Secondary | ICD-10-CM | POA: Insufficient documentation

## 2016-09-04 DIAGNOSIS — Z713 Dietary counseling and surveillance: Secondary | ICD-10-CM | POA: Diagnosis not present

## 2016-09-04 DIAGNOSIS — IMO0002 Reserved for concepts with insufficient information to code with codable children: Secondary | ICD-10-CM

## 2016-09-04 DIAGNOSIS — E1165 Type 2 diabetes mellitus with hyperglycemia: Secondary | ICD-10-CM

## 2016-09-04 DIAGNOSIS — E118 Type 2 diabetes mellitus with unspecified complications: Secondary | ICD-10-CM

## 2016-09-04 LAB — LIPID PANEL
CHOLESTEROL: 172 mg/dL (ref 0–200)
HDL: 61 mg/dL (ref >40)
LDL Cholesterol: 70 mg/dL (ref 0–99)
Total CHOL/HDL Ratio: 2.8 RATIO
Triglycerides: 203 mg/dL — ABNORMAL HIGH (ref <150)
VLDL: 41 mg/dL — AB (ref 0–40)

## 2016-09-04 NOTE — Patient Instructions (Addendum)
Goals 1. Add flax seed meal or wheat germ to smoothie 2. Try increasing exercise to 30  Minutes a day 3. Continue to drink gallon of water per day 4. Meal times: 8 am, 9 pm and 2 am. 5. Lose 1-2 lbs per week. 6. Goal of  FBS less than 120 mg/dl A1C 7% or less.  Recommend to increase to Metformin 1000 BID. Consider Jardiance or similar class drug for improved blood sugar and weight loss.

## 2016-09-04 NOTE — Progress Notes (Signed)
  Medical Nutrition Therapy:  Appt start time: 0830 end time:  0845   Assessment:  Primary concerns today: DIabetes Type 2. Recently walking daily 15 minutes. A1C down to 7.6% from 7.7%. Vit D. Started 5,000 IU a day. Stopped Premarin and now on esteraol patch by Dr. Anastasio Champion. Started seeing Dr. Anastasio Champion for weight loss started in August. Hasn't lost any weight since last visit. BS about the same.    BS was the lowest this am at 106 mg/dl. Drinking a gallon of water per day and started walking 15 minutes a day now. Januvia 100 mg per day and 500 mg of Metformin BID.     Would benefit from increasing Metformin to 1000 mg BID to help improve FBS and lower A1C.    Learning Style:  Auditory  Visual  Hands on  Learning Readiness:  Ready  Change in progress   MEDICATIONS: See list   DIETARY INTAKE:    24-hr recall:  B boiled egg and fruit, water L) Garden Salad Protein shake, (1 c unswt almond milk, 1 cup blueberries, 1 tbsp pb and 1 tb olive oil and 1 scoop and protein powder) from Dr. Anastasio Champion.  D) Chef salad- with Honey Mustard, Green Tea  Usual physical activity: Walking 15 mins per day  Estimated energy needs: 1500 calories 170 g carbohydrates 112 g protein 42 g fat  Progress Towards Goal(s):  Some progress.   Nutritional Diagnosis:  NB-1.1 Food and nutrition-related knowledge deficit As related to Diabetes.  As evidenced by A1C 7.4%..    Intervention:  Nutrition counseling and diabetes education provided on disease, Carb counting, meal planning, MY Plate, portion sizes, low fat low sodium high fiber diet, benefits of exercise, signs/symptoms and treatment of hyper/hypoglycemial and prevention of complications and testing and using results..Low fiber and high fiber diet due to Diverticulitis.   Goals 1. Add flax seed meal or wheat germ to smoothie 2. Try increasing exercise to 30  Minutes a day 3. Continue to drink gallon of water per day 4. Meal times: 8 am, 9 pm and 2  am. 5. Lose 1-2 lbs per week. 6. Goal of  FBS less than 120 mg/dl A1C 7% or less  Recommend to increase to Metformin 1000 BID. Consider Jardiance or similar class drug for improved blood sugar and weight loss.   Teaching Method Utilized:  Visual Auditory Hands on  Handouts given during visit include:  The Plate Method  The Meal Plan Card  Barriers to learning/adherence to lifestyle change: None  Demonstrated degree of understanding via:  Teach Back   Monitoring/Evaluation:  Dietary intake, exercise, meal planning , and body weight in 3 month(s)  .Recommend to increase to Metformin 1000 BID. Consider Jardiance or similar class drug for improved blood sugar and weight loss.

## 2016-09-07 MED FILL — ESTRADIOL 0.05 MG PATCH: 0.05 | 28 days supply | Qty: 8 | Fill #0

## 2016-09-08 DIAGNOSIS — E8941 Symptomatic postprocedural ovarian failure: Secondary | ICD-10-CM | POA: Diagnosis not present

## 2016-09-08 DIAGNOSIS — R6882 Decreased libido: Secondary | ICD-10-CM | POA: Diagnosis not present

## 2016-09-09 ENCOUNTER — Encounter: Payer: Self-pay | Admitting: Internal Medicine

## 2016-09-14 MED FILL — TRUE METRIX GLUCOSE TEST ST: 50 days supply | Qty: 100 | Fill #1

## 2016-09-21 MED FILL — MONTELUKAST SOD 10 MG TAB: 10 | 90 days supply | Qty: 90 | Fill #3

## 2016-09-24 DIAGNOSIS — M5417 Radiculopathy, lumbosacral region: Secondary | ICD-10-CM | POA: Diagnosis not present

## 2016-09-24 DIAGNOSIS — E119 Type 2 diabetes mellitus without complications: Secondary | ICD-10-CM | POA: Diagnosis not present

## 2016-09-24 DIAGNOSIS — Z6841 Body Mass Index (BMI) 40.0 and over, adult: Secondary | ICD-10-CM | POA: Diagnosis not present

## 2016-09-24 DIAGNOSIS — I10 Essential (primary) hypertension: Secondary | ICD-10-CM | POA: Diagnosis not present

## 2016-09-24 DIAGNOSIS — F419 Anxiety disorder, unspecified: Secondary | ICD-10-CM | POA: Diagnosis not present

## 2016-09-24 DIAGNOSIS — Z1389 Encounter for screening for other disorder: Secondary | ICD-10-CM | POA: Diagnosis not present

## 2016-09-29 DIAGNOSIS — E8941 Symptomatic postprocedural ovarian failure: Secondary | ICD-10-CM | POA: Diagnosis not present

## 2016-09-29 DIAGNOSIS — R6882 Decreased libido: Secondary | ICD-10-CM | POA: Diagnosis not present

## 2016-09-29 DIAGNOSIS — Z713 Dietary counseling and surveillance: Secondary | ICD-10-CM | POA: Diagnosis not present

## 2016-09-29 MED FILL — JANUVIA 100 MG TABLET: 100 | 90 days supply | Qty: 90 | Fill #0

## 2016-09-29 MED FILL — VALSARTAN-HCTZ 320-25 MG TA: 320-25 | 90 days supply | Qty: 90 | Fill #0

## 2016-09-29 MED FILL — ALPRAZolam 0.5 MG TABS: 0.5 | 45 days supply | Qty: 90 | Fill #1

## 2016-09-29 MED FILL — AMLODIPINE BESYLATE 10 MG T: 10 | 90 days supply | Qty: 90 | Fill #0

## 2016-09-29 MED FILL — BYDUREON 2 MG PEN INJECT: 2 | 84 days supply | Qty: 12 | Fill #0

## 2016-10-05 MED FILL — PREMARIN 0.625 MG TABLET: 0.625 | 90 days supply | Qty: 90 | Fill #2

## 2016-10-06 DIAGNOSIS — M79672 Pain in left foot: Secondary | ICD-10-CM | POA: Diagnosis not present

## 2016-10-06 DIAGNOSIS — M722 Plantar fascial fibromatosis: Secondary | ICD-10-CM | POA: Diagnosis not present

## 2016-10-29 DIAGNOSIS — M170 Bilateral primary osteoarthritis of knee: Secondary | ICD-10-CM | POA: Diagnosis not present

## 2016-11-12 DIAGNOSIS — M170 Bilateral primary osteoarthritis of knee: Secondary | ICD-10-CM | POA: Diagnosis not present

## 2016-11-18 DIAGNOSIS — B9689 Other specified bacterial agents as the cause of diseases classified elsewhere: Secondary | ICD-10-CM | POA: Diagnosis not present

## 2016-11-18 DIAGNOSIS — E119 Type 2 diabetes mellitus without complications: Secondary | ICD-10-CM | POA: Diagnosis not present

## 2016-11-18 DIAGNOSIS — R109 Unspecified abdominal pain: Secondary | ICD-10-CM | POA: Diagnosis not present

## 2016-11-18 DIAGNOSIS — I10 Essential (primary) hypertension: Secondary | ICD-10-CM | POA: Diagnosis not present

## 2016-11-18 DIAGNOSIS — T50905S Adverse effect of unspecified drugs, medicaments and biological substances, sequela: Secondary | ICD-10-CM | POA: Diagnosis not present

## 2016-11-18 DIAGNOSIS — Z1389 Encounter for screening for other disorder: Secondary | ICD-10-CM | POA: Diagnosis not present

## 2016-11-18 DIAGNOSIS — Z6841 Body Mass Index (BMI) 40.0 and over, adult: Secondary | ICD-10-CM | POA: Diagnosis not present

## 2016-11-18 DIAGNOSIS — M47816 Spondylosis without myelopathy or radiculopathy, lumbar region: Secondary | ICD-10-CM | POA: Diagnosis not present

## 2016-11-19 DIAGNOSIS — M170 Bilateral primary osteoarthritis of knee: Secondary | ICD-10-CM | POA: Diagnosis not present

## 2016-11-19 MED FILL — HYDROCODON-APAP 5-325: 5-325 | 5 days supply | Qty: 30 | Fill #0

## 2016-11-19 MED FILL — ERYTHROMYCIN EYE OINTMENT: 5 | 5 days supply | Qty: 4 | Fill #0

## 2016-11-19 MED FILL — PANTOPRAZOLE SOD DR 40 MG T: 40 | 90 days supply | Qty: 90 | Fill #0

## 2016-11-19 MED FILL — LOSARTAN POTASSIUM 25 MG TA: 25 | 90 days supply | Qty: 90 | Fill #0

## 2016-11-19 MED FILL — tiZANidine HCL 4 MG TABS: 4 | 10 days supply | Qty: 30 | Fill #0

## 2016-11-25 ENCOUNTER — Other Ambulatory Visit: Payer: Self-pay | Admitting: *Deleted

## 2016-11-25 ENCOUNTER — Encounter: Payer: Self-pay | Admitting: *Deleted

## 2016-11-25 NOTE — Patient Outreach (Signed)
[facility]) Care Management   11/25/2016  [name] [birth year] [id number]  [name] is an 55 y.o. female who presents to the [place] Management office for routine Link To Wellness follow up for self -management assistance with Type II DM, HTN, hyperlipidemia and obesity.  Subjective: [name] says she has decided not to return to seeing Dr. Anastasio Champion for his healthy lifestyle program, Optimal Health, as her estrogen dosage was reduced and she was started on testosterone.  She says she then became very moody and irritable and called the office requesting a return call. She says his nurse called 3 days later and told her she would need to be seen . She says she decided to restart the previous estrogen dose on her own. The testosterone was stopped at Dr. Lanice Shirts direction. She says she is walking every day on the treadmill in the gym at [facility] after she gets off work for 15 minutes. She says she is still using My Fitness Pal and [place] phone apps to help her with tracking calories and activity.  She says she is excited about starting the [facility] program as she hopes it will keep her motivated in improving her self care DM skills and assisting her with losing weight.   Objective:   Review of Systems  Constitutional: Negative.     Physical Exam  Constitutional: She is oriented to person, place, and time. She appears well-developed and well-nourished.  Cardiovascular: Normal rate and regular rhythm.   Respiratory: Effort normal.  Neurological: She is alert and oriented to person, place, and time.  Skin: Skin is warm and dry.  Psychiatric: She has a normal mood and affect. Her behavior is normal. Judgment normal.   Filed Weights   11/25/16 0815  Weight: 248 lb (112.5 kg)   Vitals:   11/25/16 0815  BP: 125/84     Encounter Medications:   Outpatient Encounter Prescriptions as of 09/02/2016  Medication Sig  Note  . acetaminophen (TYLENOL) 500 MG tablet Take 1,000 mg by mouth at bedtime as needed. For pain   . ALPRAZolam (XANAX) 0.5 MG tablet Take 0.5 mg by mouth at bedtime as needed for anxiety.   Marland Kitchen amLODipine (NORVASC) 10 MG tablet Take 10 mg by mouth daily.   Marland Kitchen aspirin EC 81 MG tablet Take 81 mg by mouth daily.   . Azelastine-Fluticasone (DYMISTA) 137-50 MCG/ACT SUSP Place 1 spray into the nose 2 (two) times daily as needed (nasal congestion).   . Exenatide ER (BYDUREON) 2 MG PEN Inject 2 mg into the skin every Wednesday.    . fexofenadine (ALLEGRA) 180 MG tablet Take 180 mg by mouth daily.   . metFORMIN (GLUCOPHAGE) 500 MG tablet Take 500 mg by mouth 2 (two) times daily.   . metoprolol succinate (TOPROL-XL) 50 MG 24 hr tablet Take 1 tablet by mouth daily.   . montelukast (SINGULAIR) 10 MG tablet Take 10 mg by mouth at bedtime.   . ranitidine (ZANTAC) 150 MG tablet Take 150 mg by mouth 2 (two) times daily.   . sitaGLIPtin (JANUVIA) 100 MG tablet Take 100 mg by mouth daily.   . sodium chloride (OCEAN) 0.65 % SOLN nasal spray Place 1 spray into both nostrils as needed for congestion.   Marland Kitchen estrogens, conjugated, (PREMARIN) 0.625 MG tablet Take 0.625 mg by mouth daily.     No facility-administered encounter medications on file as of 09/02/2016.     Functional Status:  In your present state of health, do you have any difficulty performing the following activities: 11/25/2016 06/01/2016  Hearing? N N  Vision? N N  Difficulty concentrating or making decisions? N N  Walking or climbing stairs? N N  Dressing or bathing? N N  Doing errands, shopping? N N  Some recent data might be hidden    Fall/Depression Screening:    PHQ 2/9 Scores 11/25/2016 09/04/2016 06/04/2016 06/01/2016 01/06/2016 01/06/2016 11/04/2015  PHQ - 2 Score 0 0 0 0 0 0 0    Assessment:   [employer] employee and Link To Wellness member with Type II DM, HTN, hyperlipidemia and morbid obesity, will be enrolling in the [employer] for ongoing DM self management assistance.  Plan:  [facility] CM Care Plan Problem One   Flowsheet Row Most Recent Value  Care Plan Problem One Patient with morbid obesity (BMI= 44), Type II DM, HTN and hyperlipidemia, now only meeting all treatment targets for HTN with BP readings consistently <130/<80, with slightly improved glycemic control as evidenced by Hgb A1C= 7.6% (prior Hgb A1C= 7.7%), hyperlipidemia- intolerant to statins, lipid panel on 06/01/16 with slightly elevated total cholesterol at 202 and LDL at 101 ( prior panel was normal)  Role Documenting the Problem One  Care Management Coordinator  Care Plan for Problem One  Active  THN Long Term Goal (31-90 days) Ongoing good control of HTN as evidenced by BP meeting treatment targets of <130/<80 for all readings, improved control of DM as evidenced by  Hgb A1C <7.0% with  blood sugars meeting pre and post meal target >75% of the time, improved control of hyperlipidemia as evidenced by normal lipid profile at next assessment and evidence of weight loss or no weight gain at next assessment. [name] will be an active participant in the [employer] program  [place] Term Goal Start Date  11/25/16  Interventions for Problem One Long Term Goal Discussed [name]'s decision to stop Dr. Lanice Shirts [facility] program, provided information on the [place] [facility] located at [facility] as another option for weight loss assistance,  reviewed medications and assessed adherence, discussed DM medications of Metformin, JANUVIA and BYDUREON, explained that Bydureon will have a copay beginning in 2018 due to a formulary change and that Victoza will not have a copay if [name] is willing to change to a daily injections and her primary care provider agrees, encouraged her to discuss with Dr. Gerarda Fraction when she sees him on 12/15, reinforced importance of taking all medications as prescribed, positive reinforcement given  to  [facility] for exercising (walking on the treadmill) in the [place] exercise room after work,  reviewed [name]'s blood sugar readings/glucose log, reviewed recommended targets for pre-meal and post-meal, reviewed changes to the [employer] To Wellness program for 2018 and ensured [name] is enrolled in the [employer], advised her that ongoing disease self management follow up will occur via the [employer] in place of Link To Wellness face to face meetings     RNCM to fax today's office visit note to Dr. Gerarda Fraction.   Barrington Ellison RN,CCM,CDE [place] Management Coordinator Link To Wellness Office Phone [phone] Office Fax [phone]

## 2016-11-26 ENCOUNTER — Ambulatory Visit (INDEPENDENT_AMBULATORY_CARE_PROVIDER_SITE_OTHER): Payer: 59 | Admitting: Otolaryngology

## 2016-11-26 DIAGNOSIS — J343 Hypertrophy of nasal turbinates: Secondary | ICD-10-CM

## 2016-11-26 DIAGNOSIS — J342 Deviated nasal septum: Secondary | ICD-10-CM | POA: Diagnosis not present

## 2016-11-26 DIAGNOSIS — J310 Chronic rhinitis: Secondary | ICD-10-CM

## 2016-11-27 DIAGNOSIS — G894 Chronic pain syndrome: Secondary | ICD-10-CM | POA: Diagnosis not present

## 2016-11-27 DIAGNOSIS — F419 Anxiety disorder, unspecified: Secondary | ICD-10-CM | POA: Diagnosis not present

## 2016-11-27 DIAGNOSIS — Z1389 Encounter for screening for other disorder: Secondary | ICD-10-CM | POA: Diagnosis not present

## 2016-11-27 DIAGNOSIS — Z6841 Body Mass Index (BMI) 40.0 and over, adult: Secondary | ICD-10-CM | POA: Diagnosis not present

## 2016-11-27 DIAGNOSIS — M159 Polyosteoarthritis, unspecified: Secondary | ICD-10-CM | POA: Diagnosis not present

## 2016-11-27 DIAGNOSIS — E119 Type 2 diabetes mellitus without complications: Secondary | ICD-10-CM | POA: Diagnosis not present

## 2016-11-27 DIAGNOSIS — L84 Corns and callosities: Secondary | ICD-10-CM | POA: Diagnosis not present

## 2016-11-27 MED FILL — tiZANidine HCL 4 MG TABS: 4 | 10 days supply | Qty: 30 | Fill #0

## 2016-11-27 MED FILL — VICTOZA 18 MG/3 ML INJECT P: 18 | 45 days supply | Qty: 9 | Fill #0

## 2016-11-30 MED FILL — metFORMIN HCL 500 MG TABS: 500 | 90 days supply | Qty: 180 | Fill #0

## 2016-11-30 MED FILL — METOPROLOL SUCC ER 50 MG TA: 50 | 60 days supply | Qty: 60 | Fill #6

## 2016-12-01 MED FILL — predniSONE 10 MG TABS: 10 | 6 days supply | Qty: 21 | Fill #0

## 2016-12-01 MED FILL — traMADol HCL 50 MG TABS: 50 | 30 days supply | Qty: 120 | Fill #0

## 2016-12-01 MED FILL — DYMISTA NASAL SPRAY: 137-50 | 30 days supply | Qty: 23 | Fill #0

## 2016-12-02 DIAGNOSIS — J9801 Acute bronchospasm: Secondary | ICD-10-CM | POA: Diagnosis not present

## 2016-12-02 DIAGNOSIS — J329 Chronic sinusitis, unspecified: Secondary | ICD-10-CM | POA: Diagnosis not present

## 2016-12-02 DIAGNOSIS — Z6841 Body Mass Index (BMI) 40.0 and over, adult: Secondary | ICD-10-CM | POA: Diagnosis not present

## 2016-12-02 DIAGNOSIS — E669 Obesity, unspecified: Secondary | ICD-10-CM | POA: Diagnosis not present

## 2016-12-02 DIAGNOSIS — Z1389 Encounter for screening for other disorder: Secondary | ICD-10-CM | POA: Diagnosis not present

## 2016-12-02 DIAGNOSIS — E119 Type 2 diabetes mellitus without complications: Secondary | ICD-10-CM | POA: Diagnosis not present

## 2016-12-02 DIAGNOSIS — J209 Acute bronchitis, unspecified: Secondary | ICD-10-CM | POA: Diagnosis not present

## 2016-12-03 ENCOUNTER — Encounter: Payer: 59 | Attending: Internal Medicine | Admitting: Nutrition

## 2016-12-03 VITALS — Wt 251.0 lb

## 2016-12-03 DIAGNOSIS — E1165 Type 2 diabetes mellitus with hyperglycemia: Secondary | ICD-10-CM

## 2016-12-03 DIAGNOSIS — Z713 Dietary counseling and surveillance: Secondary | ICD-10-CM | POA: Diagnosis not present

## 2016-12-03 DIAGNOSIS — IMO0002 Reserved for concepts with insufficient information to code with codable children: Secondary | ICD-10-CM

## 2016-12-03 DIAGNOSIS — E118 Type 2 diabetes mellitus with unspecified complications: Secondary | ICD-10-CM

## 2016-12-03 NOTE — Progress Notes (Signed)
  Medical Nutrition Therapy:  Appt start time: 0830 end time:  0900.   Assessment:  Primary concerns today: Follow up DM, Obesity. Follow up   A1C 7.6%..   Dm: Metformin 500 mg BID, Januvia 100 mg daily and Bydureon injection but will switch over to [place] in January.  She is currently being treated for Bronchitis and has been put on prednisone for 6 day started yesterday.  FBS 92-109 mg/dl.  On antibiotics of infections. Not able to exercise right now due to being sick. Previously  was walking about 15 minutes a few times per week. Still bowls 2 times a week.         Has cut out frying and eating out. Has been eating higher fiber foods.  Diet is improved but she needs increase physical activity for weight loss. Needs to make sure she is eating 30-45 grams of carbs per meal to allow her to lose weight and mainain good blood sugar control.  TG are elevated but overall TCHOL is lower. Can't tolerated statins per notes.     She would benefit from increasing Metformin to 1000 mg BID for maximum benefit and further reduce her A1C.  Lab Results  Component Value Date   HGBA1C 7.6 09/02/2016   Lipid Panel     Component Value Date/Time   CHOL 172 09/04/2016 0700   TRIG 203 (H) 09/04/2016 0700   HDL 61 09/04/2016 0700   CHOLHDL 2.8 09/04/2016 0700   VLDL 41 (H) 09/04/2016 0700   LDLCALC 70 09/04/2016 0700  .  MEDICATIONS: See list   DIETARY INTAKE:    24-hr recall:  B boiled egg and fruit, water L)  Usually a meat and vegetables. Hasn't eaten much due to not hungry from being sick. Ate chicken yesterday. D) Protein shake usually.  Usual physical activity: Walking 15 mins per day    Estimated energy needs: 1500 calories 170 g carbohydrates 112 g protein 42 g fat  Progress Towards Goal(s):  Some progress.   Nutritional Diagnosis:  NB-1.1 Food and nutrition-related knowledge deficit As related to Diabetes.  As evidenced by A1C 7.4%..    Intervention:  HIgh fiber diet, carb  counting, meal planning, and need for increased exercise of 10,000 steps per day or 60 mintues of exercise at least 4 days per week for needed weight loss.  Goal 1. Get in 10,000 stteps at least 4 days a week 2. Try not to eat and go straight to bed 3. Test blood sugar before going to work in evening Get A1C to 7 %. Lose 1 lb per week Eat 30-45 grams of carbs per meal   Teaching Method Utilized:  Visual Auditory Hands on  Handouts given during visit include:  The Plate Method  The Meal Plan Card  Barriers to learning/adherence to lifestyle change: None  Demonstrated degree of understanding via:  Teach Back   Monitoring/Evaluation:  Dietary intake, exercise, meal planning , and body weight in 3 month(s)  .Recommend to increase to Metformin 1000 BID for maximum benefit of lowering A1C since her kidney function is good and she has cardiovascular risk factors.

## 2016-12-03 NOTE — Patient Instructions (Addendum)
Goal 1. Get in 10,000 stteps at least 4 days a week 2. Try not to eat and go straight to bed 3. Test blood sugar before going to work in evening Get A1C to 7 %. Lose 1 lb per week Eat 30-45 grams of carbs per meal.

## 2016-12-08 ENCOUNTER — Other Ambulatory Visit (HOSPITAL_COMMUNITY): Payer: Self-pay | Admitting: Registered Nurse

## 2016-12-08 ENCOUNTER — Ambulatory Visit (HOSPITAL_COMMUNITY)
Admission: RE | Admit: 2016-12-08 | Discharge: 2016-12-08 | Disposition: A | Discharge status: Home / Self Care | Payer: 59 | Source: Ambulatory Visit | Attending: Registered Nurse | Admitting: Registered Nurse

## 2016-12-08 DIAGNOSIS — J45909 Unspecified asthma, uncomplicated: Secondary | ICD-10-CM | POA: Diagnosis not present

## 2016-12-08 DIAGNOSIS — K219 Gastro-esophageal reflux disease without esophagitis: Secondary | ICD-10-CM | POA: Diagnosis not present

## 2016-12-08 DIAGNOSIS — J343 Hypertrophy of nasal turbinates: Secondary | ICD-10-CM | POA: Insufficient documentation

## 2016-12-08 DIAGNOSIS — Z6841 Body Mass Index (BMI) 40.0 and over, adult: Secondary | ICD-10-CM | POA: Diagnosis not present

## 2016-12-08 DIAGNOSIS — E119 Type 2 diabetes mellitus without complications: Secondary | ICD-10-CM | POA: Diagnosis not present

## 2016-12-08 DIAGNOSIS — R05 Cough: Secondary | ICD-10-CM | POA: Diagnosis not present

## 2016-12-08 DIAGNOSIS — R0602 Shortness of breath: Secondary | ICD-10-CM | POA: Diagnosis not present

## 2016-12-08 DIAGNOSIS — E782 Mixed hyperlipidemia: Secondary | ICD-10-CM | POA: Diagnosis not present

## 2016-12-08 DIAGNOSIS — R062 Wheezing: Secondary | ICD-10-CM | POA: Diagnosis not present

## 2016-12-08 DIAGNOSIS — J069 Acute upper respiratory infection, unspecified: Secondary | ICD-10-CM

## 2016-12-08 DIAGNOSIS — J209 Acute bronchitis, unspecified: Secondary | ICD-10-CM | POA: Diagnosis not present

## 2016-12-08 DIAGNOSIS — Z1389 Encounter for screening for other disorder: Secondary | ICD-10-CM | POA: Diagnosis not present

## 2016-12-09 MED FILL — TRUE METRIX GLUCOSE TEST ST: 50 days supply | Qty: 100 | Fill #2

## 2016-12-11 MED FILL — IPRATROPIUM 0.06% SPRAY: 0.06 | 30 days supply | Qty: 15 | Fill #0

## 2016-12-21 ENCOUNTER — Other Ambulatory Visit (INDEPENDENT_AMBULATORY_CARE_PROVIDER_SITE_OTHER): Payer: Self-pay | Admitting: Otolaryngology

## 2016-12-21 ENCOUNTER — Ambulatory Visit (INDEPENDENT_AMBULATORY_CARE_PROVIDER_SITE_OTHER): Payer: 59 | Admitting: Otolaryngology

## 2016-12-21 DIAGNOSIS — J342 Deviated nasal septum: Secondary | ICD-10-CM | POA: Diagnosis not present

## 2016-12-21 DIAGNOSIS — J343 Hypertrophy of nasal turbinates: Secondary | ICD-10-CM | POA: Diagnosis not present

## 2016-12-21 DIAGNOSIS — J0101 Acute recurrent maxillary sinusitis: Secondary | ICD-10-CM

## 2016-12-25 ENCOUNTER — Encounter (HOSPITAL_COMMUNITY): Payer: Self-pay | Admitting: Emergency Medicine

## 2016-12-25 ENCOUNTER — Observation Stay (HOSPITAL_COMMUNITY)
Admission: EM | Admit: 2016-12-25 | Discharge: 2016-12-26 | Disposition: A | Discharge status: Home / Self Care | Payer: 59 | Attending: Internal Medicine | Admitting: Internal Medicine

## 2016-12-25 ENCOUNTER — Emergency Department (HOSPITAL_COMMUNITY): Payer: 59

## 2016-12-25 DIAGNOSIS — E1140 Type 2 diabetes mellitus with diabetic neuropathy, unspecified: Secondary | ICD-10-CM | POA: Diagnosis not present

## 2016-12-25 DIAGNOSIS — Z7984 Long term (current) use of oral hypoglycemic drugs: Secondary | ICD-10-CM | POA: Insufficient documentation

## 2016-12-25 DIAGNOSIS — Z79899 Other long term (current) drug therapy: Secondary | ICD-10-CM | POA: Diagnosis not present

## 2016-12-25 DIAGNOSIS — R1110 Vomiting, unspecified: Secondary | ICD-10-CM | POA: Diagnosis not present

## 2016-12-25 DIAGNOSIS — E876 Hypokalemia: Secondary | ICD-10-CM | POA: Diagnosis not present

## 2016-12-25 DIAGNOSIS — R112 Nausea with vomiting, unspecified: Secondary | ICD-10-CM | POA: Diagnosis not present

## 2016-12-25 DIAGNOSIS — E119 Type 2 diabetes mellitus without complications: Secondary | ICD-10-CM | POA: Insufficient documentation

## 2016-12-25 DIAGNOSIS — Z87891 Personal history of nicotine dependence: Secondary | ICD-10-CM | POA: Diagnosis not present

## 2016-12-25 DIAGNOSIS — R197 Diarrhea, unspecified: Secondary | ICD-10-CM | POA: Diagnosis not present

## 2016-12-25 DIAGNOSIS — G43A1 Cyclical vomiting, intractable: Secondary | ICD-10-CM | POA: Diagnosis not present

## 2016-12-25 DIAGNOSIS — I10 Essential (primary) hypertension: Secondary | ICD-10-CM | POA: Insufficient documentation

## 2016-12-25 DIAGNOSIS — J45909 Unspecified asthma, uncomplicated: Secondary | ICD-10-CM | POA: Diagnosis not present

## 2016-12-25 DIAGNOSIS — R1084 Generalized abdominal pain: Secondary | ICD-10-CM | POA: Diagnosis not present

## 2016-12-25 DIAGNOSIS — R05 Cough: Secondary | ICD-10-CM | POA: Diagnosis not present

## 2016-12-25 DIAGNOSIS — Z7982 Long term (current) use of aspirin: Secondary | ICD-10-CM | POA: Diagnosis not present

## 2016-12-25 DIAGNOSIS — R061 Stridor: Secondary | ICD-10-CM | POA: Diagnosis not present

## 2016-12-25 DIAGNOSIS — R109 Unspecified abdominal pain: Secondary | ICD-10-CM | POA: Diagnosis not present

## 2016-12-25 DIAGNOSIS — R059 Cough, unspecified: Secondary | ICD-10-CM

## 2016-12-25 DIAGNOSIS — R030 Elevated blood-pressure reading, without diagnosis of hypertension: Secondary | ICD-10-CM | POA: Diagnosis not present

## 2016-12-25 LAB — INFLUENZA PANEL BY PCR (TYPE A & B)
Influenza A By PCR: NEGATIVE
Influenza B By PCR: NEGATIVE

## 2016-12-25 LAB — CBC WITH DIFFERENTIAL/PLATELET
BASOS PCT: 0 %
Basophils Absolute: 0 10*3/uL (ref 0.0–0.1)
EOS ABS: 0.2 10*3/uL (ref 0.0–0.7)
EOS PCT: 3 %
HCT: 41.2 % (ref 36.0–46.0)
HEMOGLOBIN: 13.9 g/dL (ref 12.0–15.0)
Lymphocytes Relative: 27 %
Lymphs Abs: 2.2 10*3/uL (ref 0.7–4.0)
MCH: 30 pg (ref 26.0–34.0)
MCHC: 33.7 g/dL (ref 30.0–36.0)
MCV: 89 fL (ref 78.0–100.0)
MONOS PCT: 8 %
Monocytes Absolute: 0.7 10*3/uL (ref 0.1–1.0)
NEUTROS PCT: 62 %
Neutro Abs: 5.2 10*3/uL (ref 1.7–7.7)
PLATELETS: 269 10*3/uL (ref 150–400)
RBC: 4.63 MIL/uL (ref 3.87–5.11)
RDW: 14.2 % (ref 11.5–15.5)
WBC: 8.3 10*3/uL (ref 4.0–10.5)

## 2016-12-25 LAB — COMPREHENSIVE METABOLIC PANEL
ALK PHOS: 46 U/L (ref 38–126)
ALT: 26 U/L (ref 14–54)
ANION GAP: 8 (ref 5–15)
AST: 27 U/L (ref 15–41)
Albumin: 3.5 g/dL (ref 3.5–5.0)
BILIRUBIN TOTAL: 0.3 mg/dL (ref 0.3–1.2)
BUN: 13 mg/dL (ref 6–20)
CALCIUM: 9.1 mg/dL (ref 8.9–10.3)
CO2: 29 mmol/L (ref 22–32)
CREATININE: 0.52 mg/dL (ref 0.44–1.00)
Chloride: 101 mmol/L (ref 101–111)
GFR calc non Af Amer: >60 mL/min (ref >60)
GLUCOSE: 163 mg/dL — AB (ref 65–99)
Potassium: 3.2 mmol/L — ABNORMAL LOW (ref 3.5–5.1)
Sodium: 138 mmol/L (ref 135–145)
TOTAL PROTEIN: 7.8 g/dL (ref 6.5–8.1)

## 2016-12-25 LAB — LIPASE, BLOOD: Lipase: 37 U/L (ref 11–51)

## 2016-12-25 LAB — URINALYSIS, ROUTINE W REFLEX MICROSCOPIC
BILIRUBIN URINE: NEGATIVE
Glucose, UA: 50 mg/dL — AB
HGB URINE DIPSTICK: NEGATIVE
KETONES UR: NEGATIVE mg/dL
Leukocytes, UA: NEGATIVE
NITRITE: NEGATIVE
PROTEIN: NEGATIVE mg/dL
SPECIFIC GRAVITY, URINE: 1.01 (ref 1.005–1.030)
pH: 6 (ref 5.0–8.0)

## 2016-12-25 LAB — CBG MONITORING, ED: GLUCOSE-CAPILLARY: 187 mg/dL — AB (ref 65–99)

## 2016-12-25 MED ORDER — METOPROLOL SUCCINATE ER 50 MG PO TB24
50.0000 mg | ORAL_TABLET | Freq: Every day | ORAL | Status: DC
  Administered 2016-12-26: 50 mg via ORAL
  Filled 2016-12-25: qty 1

## 2016-12-25 MED ORDER — SODIUM CHLORIDE 0.9 % IV SOLN
INTRAVENOUS | Status: DC
  Administered 2016-12-25 (×2): via INTRAVENOUS

## 2016-12-25 MED ORDER — POTASSIUM CHLORIDE CRYS ER 20 MEQ PO TBCR: 40.0000 meq | EXTENDED_RELEASE_TABLET | Freq: Once | ORAL | Status: DC

## 2016-12-25 MED ORDER — DICYCLOMINE HCL 10 MG/ML IM SOLN
20.0000 mg | Freq: Once | INTRAMUSCULAR | Status: AC
  Administered 2016-12-25: 20 mg via INTRAMUSCULAR
  Filled 2016-12-25: qty 2

## 2016-12-25 MED ORDER — ONDANSETRON HCL 4 MG/2ML IJ SOLN
INTRAMUSCULAR | Status: AC
  Filled 2016-12-25: qty 2

## 2016-12-25 MED ORDER — LOSARTAN POTASSIUM 25 MG PO TABS
25.0000 mg | ORAL_TABLET | Freq: Every day | ORAL | Status: DC
  Administered 2016-12-26: 25 mg via ORAL
  Filled 2016-12-25 (×3): qty 1

## 2016-12-25 MED ORDER — LORAZEPAM 2 MG/ML IJ SOLN: 0.5000 mg | Freq: Four times a day (QID) | INTRAMUSCULAR | Status: DC | PRN

## 2016-12-25 MED ORDER — ONDANSETRON HCL 4 MG/2ML IJ SOLN: 4.0000 mg | Freq: Three times a day (TID) | INTRAMUSCULAR | Status: AC | PRN

## 2016-12-25 MED ORDER — IOPAMIDOL (ISOVUE-300) INJECTION 61%
INTRAVENOUS | Status: AC
  Filled 2016-12-25: qty 30

## 2016-12-25 MED ORDER — MORPHINE SULFATE (PF) 2 MG/ML IV SOLN: 2.0000 mg | INTRAVENOUS | Status: DC | PRN

## 2016-12-25 MED ORDER — PROMETHAZINE HCL 25 MG/ML IJ SOLN
12.5000 mg | Freq: Four times a day (QID) | INTRAMUSCULAR | Status: DC | PRN
  Administered 2016-12-26: 12.5 mg via INTRAVENOUS
  Filled 2016-12-25: qty 1

## 2016-12-25 MED ORDER — LORAZEPAM 2 MG/ML IJ SOLN
0.5000 mg | Freq: Once | INTRAMUSCULAR | Status: AC
  Administered 2016-12-25: 0.5 mg via INTRAVENOUS
  Filled 2016-12-25: qty 1

## 2016-12-25 MED ORDER — MORPHINE SULFATE (PF) 4 MG/ML IV SOLN
4.0000 mg | INTRAVENOUS | Status: DC | PRN
  Administered 2016-12-25: 4 mg via INTRAVENOUS
  Filled 2016-12-25: qty 1

## 2016-12-25 MED ORDER — INSULIN ASPART 100 UNIT/ML ~~LOC~~ SOLN
0.0000 [IU] | Freq: Three times a day (TID) | SUBCUTANEOUS | Status: DC
  Administered 2016-12-26 (×2): 2 [IU] via SUBCUTANEOUS

## 2016-12-25 MED ORDER — IOPAMIDOL (ISOVUE-300) INJECTION 61%
100.0000 mL | Freq: Once | INTRAVENOUS | Status: AC | PRN
  Administered 2016-12-25: 100 mL via INTRAVENOUS

## 2016-12-25 MED ORDER — HYDROMORPHONE HCL 1 MG/ML IJ SOLN: 1.0000 mg | INTRAMUSCULAR | Status: AC | PRN

## 2016-12-25 MED ORDER — ENOXAPARIN SODIUM 40 MG/0.4ML ~~LOC~~ SOLN
40.0000 mg | SUBCUTANEOUS | Status: DC
  Filled 2016-12-25 (×2): qty 0.4

## 2016-12-25 MED ORDER — POTASSIUM CHLORIDE 10 MEQ/100ML IV SOLN
10.0000 meq | INTRAVENOUS | Status: AC
  Administered 2016-12-26 (×2): 10 meq via INTRAVENOUS
  Filled 2016-12-25 (×2): qty 100

## 2016-12-25 MED ORDER — ONDANSETRON HCL 4 MG/2ML IJ SOLN
4.0000 mg | INTRAMUSCULAR | Status: DC | PRN
  Administered 2016-12-25: 4 mg via INTRAVENOUS
  Filled 2016-12-25: qty 2

## 2016-12-25 MED ORDER — AMLODIPINE BESYLATE 5 MG PO TABS
10.0000 mg | ORAL_TABLET | Freq: Every day | ORAL | Status: DC
  Administered 2016-12-26: 10 mg via ORAL
  Filled 2016-12-25: qty 2

## 2016-12-25 MED ORDER — METOCLOPRAMIDE HCL 5 MG/ML IJ SOLN
10.0000 mg | Freq: Once | INTRAMUSCULAR | Status: AC
Start: 2016-12-25 — End: 2016-12-25
  Administered 2016-12-25: 10 mg via INTRAVENOUS
  Filled 2016-12-25: qty 2

## 2016-12-25 MED ORDER — PANTOPRAZOLE SODIUM 40 MG PO TBEC
40.0000 mg | DELAYED_RELEASE_TABLET | Freq: Every day | ORAL | Status: DC
  Administered 2016-12-26: 40 mg via ORAL
  Filled 2016-12-25: qty 1

## 2016-12-25 MED ORDER — ONDANSETRON HCL 4 MG/2ML IJ SOLN
4.0000 mg | Freq: Once | INTRAMUSCULAR | Status: AC
  Administered 2016-12-25: 4 mg via INTRAVENOUS

## 2016-12-25 NOTE — ED Triage Notes (Signed)
C/o n/v.  Vomiting on arrival to ED.  Denies any pain.  C/o weakness.

## 2016-12-25 NOTE — ED Notes (Signed)
Pt has vomited several times and not able to go to CT-scan.  Dr Thurnell Garbe notified.

## 2016-12-25 NOTE — ED Provider Notes (Signed)
[facility] DEPT Provider Note   CSN: [id number] Arrival date & time: 12/25/16  1300     History   Chief Complaint Chief Complaint  Patient presents with  . Emesis    HPI [name] is a 56 y.o. female.  HPI  Pt was seen at 1350. Per pt, c/o gradual onset and persistence of multiple intermittent episodes of N/V that began 2 hours ago.  Has been associated with generalized "cramping" abd pain. States she had been "coughing" for the past few weeks, since improved. Denies diarrhea, no CP/SOB, no back pain, no fevers, no black or blood in stools or emesis.    Past Medical History:  Diagnosis Date  . Anxiety   . Asthma    diagnosed as an adult  . Diabetes mellitus   . Hypertension     Patient Active Problem List   Diagnosis Date Noted  . Diabetes ([place]) 09/02/2016  . Asthma 09/02/2016  . MORBID OBESITY 07/02/2009  . HYPERTENSION 07/02/2009  . CHEST DISCOMFORT 07/02/2009    Past Surgical History:  Procedure Laterality Date  . ABDOMINAL HYSTERECTOMY    . CARPAL TUNNEL RELEASE    . CESAREAN SECTION    . CHOLECYSTECTOMY    . SINUS EXPLORATION    . TRIGGER FINGER RELEASE    . WRIST SURGERY      OB History    No data available       Home Medications    Prior to Admission medications   Medication Sig Start Date End Date Taking? Authorizing Provider  acetaminophen (TYLENOL) 500 MG tablet Take 1,000 mg by mouth at bedtime as needed. For pain    Historical Provider, MD  ALPRAZolam Duanne Moron) 0.5 MG tablet Take 0.5 mg by mouth at bedtime as needed for anxiety.    Historical Provider, MD  amLODipine (NORVASC) 10 MG tablet Take 10 mg by mouth daily.    Historical Provider, MD  aspirin EC 81 MG tablet Take 81 mg by mouth daily.    Historical Provider, MD  Azelastine-Fluticasone (DYMISTA) 137-50 MCG/ACT SUSP Place 1 spray into the nose 2 (two) times daily as needed (nasal congestion).    Historical Provider, MD  estrogens, conjugated, (PREMARIN) 0.625 MG tablet  Take 0.625 mg by mouth daily.     Historical Provider, MD  Exenatide ER (BYDUREON) 2 MG PEN Inject 2 mg into the skin every Wednesday.     Historical Provider, MD  fexofenadine (ALLEGRA) 180 MG tablet Take 180 mg by mouth daily.    Historical Provider, MD  losartan (COZAAR) 25 MG tablet Take 25 mg by mouth daily.    Historical Provider, MD  magnesium oxide (MAG-OX) 400 MG tablet Take 400 mg by mouth 2 (two) times daily.    Historical Provider, MD  metFORMIN (GLUCOPHAGE) 500 MG tablet Take 500 mg by mouth 2 (two) times daily. 01/14/16   Historical Provider, MD  metoprolol succinate (TOPROL-XL) 50 MG 24 hr tablet Take 1 tablet by mouth daily. 05/26/16   Historical Provider, MD  montelukast (SINGULAIR) 10 MG tablet Take 10 mg by mouth at bedtime.    Historical Provider, MD  pantoprazole (PROTONIX) 40 MG tablet Take 40 mg by mouth daily.    Historical Provider, MD  ranitidine (ZANTAC) 150 MG tablet Take 150 mg by mouth 2 (two) times daily.    Historical Provider, MD  sitaGLIPtin (JANUVIA) 100 MG tablet Take 100 mg by mouth daily.    Historical Provider, MD  sodium chloride (OCEAN) 0.65 % SOLN  nasal spray Place 1 spray into both nostrils as needed for congestion.    Historical Provider, MD  testosterone enanthate (DELATESTRYL) 200 MG/ML injection Inject into the muscle every 14 (fourteen) days. For IM use only    Historical Provider, MD  valsartan-hydrochlorothiazide (DIOVAN-HCT) 320-25 MG tablet Take 1 tablet by mouth daily.    Historical Provider, MD    Family History Family History  Problem Relation Age of Onset  . Hypertension Mother   . Diabetes Mother   . Stroke Mother     Social History Social History  Substance Use Topics  . Smoking status: Former Research scientist (life sciences)  . Smokeless tobacco: Never Used  . Alcohol use No     Allergies   Barium-containing compounds; Statins; and Sulfa antibiotics   Review of Systems Review of Systems ROS: Statement: All systems negative except as marked or  noted in the HPI; Constitutional: Negative for fever and chills. ; ; Eyes: Negative for eye pain, redness and discharge. ; ; ENMT: Negative for ear pain, hoarseness, nasal congestion, sinus pressure and sore throat. ; ; Cardiovascular: Negative for chest pain, palpitations, diaphoresis, dyspnea and peripheral edema. ; ; Respiratory: +cough. Negative for wheezing and stridor. ; ; Gastrointestinal: +N/V, abd pain. Negative for diarrhea, blood in stool, hematemesis, jaundice and rectal bleeding. . ; ; Genitourinary: Negative for dysuria, flank pain and hematuria. ; ; Musculoskeletal: Negative for back pain and neck pain. Negative for swelling and trauma.; ; Skin: Negative for pruritus, rash, abrasions, blisters, bruising and skin lesion.; ; Neuro: Negative for headache, lightheadedness and neck stiffness. Negative for weakness, altered level of consciousness, altered mental status, extremity weakness, paresthesias, involuntary movement, seizure and syncope.       Physical Exam Updated Vital Signs BP 153/73 (BP Location: Left Arm)   Pulse 83   Temp 97.8 F (36.6 C) (Oral)   Resp 22   Ht 5\' 3"  (1.6 m)   Wt 249 lb (112.9 kg)   SpO2 96%   BMI 44.11 kg/m   Physical Exam 1355: Physical examination:  Nursing notes reviewed; Vital signs and O2 SAT reviewed;  Constitutional: Well developed, Well nourished, Well hydrated, Uncomfortable appearing.; Head:  Normocephalic, atraumatic; Eyes: EOMI, PERRL, No scleral icterus; ENMT: Mouth and pharynx normal, Mucous membranes moist; Neck: Supple, Full range of motion, No lymphadenopathy; Cardiovascular: Regular rate and rhythm, No gallop; Respiratory: Breath sounds clear & equal bilaterally, No wheezes.  Speaking full sentences with ease, Normal respiratory effort/excursion; Chest: Nontender, Movement normal; Abdomen: Soft, +diffuse tenderness to palp. No rebound or guarding. Nondistended, Normal bowel sounds; Genitourinary: No CVA tenderness; Extremities: Pulses  normal, No tenderness, No edema, No calf edema or asymmetry.; Neuro: AA&Ox3, Major CN grossly intact.  Speech clear. No gross focal motor or sensory deficits in extremities.; Skin: Color normal, Warm, Dry.   ED Treatments / Results  Labs (all labs ordered are listed, but only abnormal results are displayed)   EKG  EKG Interpretation None       Radiology   Procedures Procedures (including critical care time)  Medications Ordered in ED Medications  0.9 %  sodium chloride infusion (not administered)  dicyclomine (BENTYL) injection 20 mg (not administered)  iopamidol (ISOVUE-300) 61 % injection (not administered)  ondansetron (ZOFRAN) injection 4 mg (4 mg Intravenous Given 12/25/16 1320)     Initial Impression / Assessment and Plan / ED Course  I have reviewed the triage vital signs and the nursing notes.  Pertinent labs & imaging results that were available during my care  of the patient were reviewed by me and considered in my medical decision making (see chart for details).  MDM Reviewed: previous chart, nursing note and vitals Reviewed previous: labs Interpretation: labs, x-ray and CT scan   Results for orders placed or performed during the hospital encounter of 12/25/16  Comprehensive metabolic panel  Result Value Ref Range   Sodium 138 135 - 145 mmol/L   Potassium 3.2 (L) 3.5 - 5.1 mmol/L   Chloride 101 101 - 111 mmol/L   CO2 29 22 - 32 mmol/L   Glucose, Bld 163 (H) 65 - 99 mg/dL   BUN 13 6 - 20 mg/dL   Creatinine, Ser 0.52 0.44 - 1.00 mg/dL   Calcium 9.1 8.9 - 10.3 mg/dL   Total Protein 7.8 6.5 - 8.1 g/dL   Albumin 3.5 3.5 - 5.0 g/dL   AST 27 15 - 41 U/L   ALT 26 14 - 54 U/L   Alkaline Phosphatase 46 38 - 126 U/L   Total Bilirubin 0.3 0.3 - 1.2 mg/dL   GFR calc non Af Amer >60 >60 mL/min   GFR calc Af Amer >60 >60 mL/min   Anion gap 8 5 - 15  Lipase, blood  Result Value Ref Range   Lipase 37 11 - 51 U/L  CBC with Differential  Result Value Ref Range     WBC 8.3 4.0 - 10.5 K/uL   RBC 4.63 3.87 - 5.11 MIL/uL   Hemoglobin 13.9 12.0 - 15.0 g/dL   HCT 41.2 36.0 - 46.0 %   MCV 89.0 78.0 - 100.0 fL   MCH 30.0 26.0 - 34.0 pg   MCHC 33.7 30.0 - 36.0 g/dL   RDW 14.2 11.5 - 15.5 %   Platelets 269 150 - 400 K/uL   Neutrophils Relative % 62 %   Neutro Abs 5.2 1.7 - 7.7 K/uL   Lymphocytes Relative 27 %   Lymphs Abs 2.2 0.7 - 4.0 K/uL   Monocytes Relative 8 %   Monocytes Absolute 0.7 0.1 - 1.0 K/uL   Eosinophils Relative 3 %   Eosinophils Absolute 0.2 0.0 - 0.7 K/uL   Basophils Relative 0 %   Basophils Absolute 0.0 0.0 - 0.1 K/uL  Urinalysis, Routine w reflex microscopic  Result Value Ref Range   Color, Urine STRAW (A) YELLOW   APPearance CLEAR CLEAR   Specific Gravity, Urine 1.010 1.005 - 1.030   pH 6.0 5.0 - 8.0   Glucose, UA 50 (A) NEGATIVE mg/dL   Hgb urine dipstick NEGATIVE NEGATIVE   Bilirubin Urine NEGATIVE NEGATIVE   Ketones, ur NEGATIVE NEGATIVE mg/dL   Protein, ur NEGATIVE NEGATIVE mg/dL   Nitrite NEGATIVE NEGATIVE   Leukocytes, UA NEGATIVE NEGATIVE  POC CBG, ED  Result Value Ref Range   Glucose-Capillary 187 (H) 65 - 99 mg/dL   Ct Abdomen Pelvis W Contrast Result Date: 12/25/2016 CLINICAL DATA:  Abdominal pain with nausea, vomiting, and diarrhea since noon. EXAM: CT ABDOMEN AND PELVIS WITH CONTRAST TECHNIQUE: Multidetector CT imaging of the abdomen and pelvis was performed using the standard protocol following bolus administration of intravenous contrast. CONTRAST:  141mL ISOVUE-300 IOPAMIDOL (ISOVUE-300) INJECTION 61% COMPARISON:  06/29/2016 FINDINGS: Lower chest: Mild atelectasis medially in the right middle lobe. Chronic scarring in the posterior basal segment left lower lobe. Mild cardiomegaly. Hepatobiliary: Diffuse hepatic steatosis.  Cholecystectomy. Pancreas: Unremarkable Spleen: Unremarkable Adrenals/Urinary Tract: Adrenal glands normal. Bilateral renal cysts are observed, similar to the prior exam -some of the hypodense  renal lesions are  technically too small to characterize. Stomach/Bowel: Scattered colonic diverticula most notable in the ascending colon and proximal transverse colon. Appendix normal. Vascular/Lymphatic: Aortoiliac atherosclerotic vascular disease. Stable mildly prominent peripancreatic lymph node at 1.3 cm on image 23/2. Reproductive: Uterus absent.  Adnexa unremarkable. Other: No supplemental non-categorized findings. 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 Transitional L5 vertebra with an unusual appearance of irregular bridging ossification from the transverse processes to the iliac bones. Multilevel degenerative facet arthropathy in the lumbar spine with mild grade 1 anterolisthesis at L3-4. Spurring causes left foraminal stenosis at the L3-4 level. IMPRESSION: 1. A cause for the patient'  s current symptoms is not identified. 2. Proximal colonic diverticulosis without active diverticulitis. 3. Bilateral renal cysts. 4. Diffuse hepatic steatosis. 5. Mild atelectasis medially in the right middle lobe. Scarring in the left lower lobe. 6. Mild enlargement of the cardiopericardial silhouette, without basilar edema. 7. Left foraminal stenosis at L3-4 due to spurring. 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 Transitional L5 vertebra. 8.  Aortoiliac atherosclerotic vascular disease. 9. Mildly enlarged but chronically stable peripancreatic lymph node at 1.2 cm. Electronically Signed   By: Van Clines M.D.   On: 12/25/2016 20:21   Dg Chest Port 1 View Result Date: 12/25/2016 CLINICAL DATA:  Cough.  Abdominal pain, nausea and vomiting. EXAM: PORTABLE CHEST 1 VIEW COMPARISON:  12/08/2016 chest radiograph. FINDINGS: Stable cardiomediastinal silhouette with mild cardiomegaly. No pneumothorax. No pleural effusion. No overt pulmonary edema. Hazy opacities at the lung bases bilaterally. IMPRESSION: Nonspecific hazy bibasilar lung opacities, cannot exclude aspiration  or pneumonia. Consider correlation with PA and lateral chest radiographs. Recommend follow-up PA and lateral post treatment chest radiographs in 4-6 weeks. Electronically Signed   By: Ilona Sorrel M.D.   On: 12/25/2016 15:14    2100:  Continues to c/o nausea and is unable to tol PO despite multiple doses of IV medications for same. CT scan reassuring. Dx and testing d/w pt and family.  Questions answered.  Verb understanding, agreeable to admit.  T/C to [facility] Dr. Darrick Meigs, case discussed, including:  HPI, pertinent PM/SHx, VS/PE, dx testing, ED course and treatment:  Agreeable to admit, requests to write temporary orders, obtain observation medical bed to team APAdmits.   Final Clinical Impressions(s) / ED Diagnoses   Final diagnoses:  None    New Prescriptions New Prescriptions   No medications on file     Francine Graven, DO 12/29/16 1701

## 2016-12-25 NOTE — ED Notes (Signed)
Pt is feeling better and able to go to CT.  Rates pain 4/10 and nausea is better.

## 2016-12-25 NOTE — H&P (Signed)
TRH H&P    Patient Demographics:    [name], is a 56 y.o. female  MRN: [id number]  DOB - Jan 09, 1961  Admit Date - 12/25/2016  Referring MD/NP/PA: Dr. Thurnell Garbe  Outpatient Primary MD for the patient is Glo Herring., MD  Patient coming from: Home  Chief Complaint  Patient presents with  . Emesis      HPI:    [name]  is a 56 y.o. female, With history of diabetes mellitus, hypertension who came to hospital with 1 day history of intractable nausea and vomiting. As the patient around noontime today she started having nausea which was accompanied by chills, she also vomited once before coming to the hospital. Since she came to the ED she vomited 3 times. Vomiting resolved after she got Ativan and morphine. Patient says Zofran did not work for her. CT scan abdomen showed no significant abnormality. Chest x-ray showed possible bibasilar infiltrates which was confirmed to be scarring on CT abdomen pelvis. Patient recently completed 10 days of antibiotics Levaquin for acute bronchitis. She denies diarrhea. No dysuria. Has epigastric abdominal pain. No fever .    Review of systems:    In addition to the HPI above,  No Fever+chills, No Headache, No changes with Vision or hearing, No problems swallowing food or Liquids, No Chest pain, + Cough or Shortness of Breath, + Abdominal pain, bowel movements are regular, No Blood in stool or Urine, No dysuria, No new skin rashes or bruises, No new joints pains-aches,  No new weakness, tingling, numbness in any extremity, No recent weight gain or loss, No polyuria, polydypsia or polyphagia, No significant Mental Stressors.  A full 10 point Review of Systems was done, except as stated above, all other Review of Systems were negative.   With Past History of the following :    Past Medical History:  Diagnosis Date  . Anxiety   . Asthma    diagnosed as an adult  . Diabetes mellitus   . Hypertension       Past Surgical History:  Procedure Laterality Date  . ABDOMINAL HYSTERECTOMY    . CARPAL TUNNEL RELEASE    . CESAREAN SECTION    . CHOLECYSTECTOMY    . SINUS EXPLORATION    . TRIGGER FINGER RELEASE    . WRIST SURGERY        Social History:      Social History  Substance Use Topics  . Smoking status: Former Research scientist (life sciences)  . Smokeless tobacco: Never Used  . Alcohol use No       Family History :     Family History  Problem Relation Age of Onset  . Hypertension Mother   . Diabetes Mother   . Stroke Mother       Home Medications:   Prior to Admission medications   Medication Sig Start Date End Date Taking? Authorizing Provider  acetaminophen (TYLENOL) 500 MG tablet Take 1,000 mg by mouth at bedtime as needed. For pain   Yes Historical Provider, MD  ALPRAZolam Duanne Moron) 0.5 MG tablet Take  0.5 mg by mouth 2 (two) times daily as needed for anxiety.    Yes Historical Provider, MD  amLODipine (NORVASC) 10 MG tablet Take 10 mg by mouth daily.   Yes Historical Provider, MD  aspirin EC 81 MG tablet Take 81 mg by mouth daily.   Yes Historical Provider, MD  Azelastine-Fluticasone (DYMISTA) 137-50 MCG/ACT SUSP Place 1 spray into the nose 2 (two) times daily as needed (nasal congestion).   Yes Historical Provider, MD  estrogens, conjugated, (PREMARIN) 0.625 MG tablet Take 0.625 mg by mouth daily.    Yes Historical Provider, MD  Exenatide ER (BYDUREON) 2 MG PEN Inject 2 mg into the skin every Wednesday.    Yes Historical Provider, MD  fexofenadine (ALLEGRA) 180 MG tablet Take 180 mg by mouth daily.   Yes Historical Provider, MD  ipratropium (ATROVENT) 0.06 % nasal spray Place 2 sprays into both nostrils 2 (two) times daily as needed for rhinitis.   Yes Historical Provider, MD  liraglutide (VICTOZA) 18 MG/3ML SOPN Inject 1.2 mg into the skin daily.   Yes Historical Provider, MD  losartan (COZAAR) 25 MG tablet Take 25 mg by  mouth daily.   Yes Historical Provider, MD  metFORMIN (GLUCOPHAGE) 500 MG tablet Take 500 mg by mouth 2 (two) times daily. 01/14/16  Yes Historical Provider, MD  metoprolol succinate (TOPROL-XL) 50 MG 24 hr tablet Take 1 tablet by mouth daily. 05/26/16  Yes Historical Provider, MD  montelukast (SINGULAIR) 10 MG tablet Take 10 mg by mouth at bedtime.   Yes Historical Provider, MD  pantoprazole (PROTONIX) 40 MG tablet Take 40 mg by mouth daily.   Yes Historical Provider, MD  sitaGLIPtin (JANUVIA) 100 MG tablet Take 100 mg by mouth daily.   Yes Historical Provider, MD  traMADol (ULTRAM) 50 MG tablet Take 50 mg by mouth 3 (three) times daily as needed for moderate pain or severe pain.   Yes Historical Provider, MD     Allergies:     Allergies  Allergen Reactions  . Barium-Containing Compounds Itching and Other (See Comments)    Weakness, loss of voice  . Statins Other (See Comments)    Myalgia, muscle weakness  . Sulfa Antibiotics Itching and Other (See Comments)    Weakness, loss of voice     Physical Exam:   Vitals  Blood pressure 134/69, pulse 85, temperature 97.8 F (36.6 C), temperature source Oral, resp. rate 16, height 5\' 3"  (1.6 m), weight 112.9 kg (249 lb), SpO2 96 %.  1.  General: African-American female in no acute distress  2. Psychiatric:  Intact judgement and  insight, awake alert, oriented x 3.  3. Neurologic: No focal neurological deficits, all cranial nerves intact.Strength 5/5 all 4 extremities, sensation intact all 4 extremities, plantars down going.  4. Eyes :  anicteric sclerae, moist conjunctivae with no lid lag. PERRLA.  5. ENMT:  Oropharynx clear with moist mucous membranes and good dentition  6. Neck:  supple, no cervical lymphadenopathy appriciated, No thyromegaly  7. Respiratory : Normal respiratory effort, good air movement bilaterally,clear to  auscultation bilaterally  8. Cardiovascular : RRR, no gallops, rubs or murmurs, no leg edema  9.  Gastrointestinal:  Positive bowel sounds, abdomen soft, non-tender to palpation,no hepatosplenomegaly, no rigidity or guarding       10. Skin:  No cyanosis, normal texture and turgor, no rash, lesions or ulcers      Data Review:    CBC  Recent Labs Lab 12/25/16 1420  WBC 8.3  HGB 13.9  HCT 41.2  PLT 269  MCV 89.0  MCH 30.0  MCHC 33.7  RDW 14.2  LYMPHSABS 2.2  MONOABS 0.7  EOSABS 0.2  BASOSABS 0.0   ------------------------------------------------------------------------------------------------------------------  Chemistries   Recent Labs Lab 12/25/16 1420  NA 138  K 3.2*  CL 101  CO2 29  GLUCOSE 163*  BUN 13  CREATININE 0.52  CALCIUM 9.1  AST 27  ALT 26  ALKPHOS 46  BILITOT 0.3   ------------------------------------------------------------------------------------------------------------------  ------------------------------------------------------------------------------------------------------------------ GFR: Estimated Creatinine Clearance: 93.8 mL/min (by C-G formula based on SCr of 0.52 mg/dL). Liver Function Tests:  Recent Labs Lab 12/25/16 1420  AST 27  ALT 26  ALKPHOS 46  BILITOT 0.3  PROT 7.8  ALBUMIN 3.5    Recent Labs Lab 12/25/16 1420  LIPASE 37    CBG:  Recent Labs Lab 12/25/16 1739  GLUCAP 187*    --------------------------------------------------------------------------------------------------------------- Urine analysis:    Component Value Date/Time   COLORURINE STRAW (A) 12/25/2016 1450   APPEARANCEUR CLEAR 12/25/2016 1450   LABSPEC 1.010 12/25/2016 1450   PHURINE 6.0 12/25/2016 1450   GLUCOSEU 50 (A) 12/25/2016 1450   HGBUR NEGATIVE 12/25/2016 1450   BILIRUBINUR NEGATIVE 12/25/2016 1450   KETONESUR NEGATIVE 12/25/2016 1450   PROTEINUR NEGATIVE 12/25/2016 1450   UROBILINOGEN 0.2 08/26/2014 0810   NITRITE NEGATIVE 12/25/2016 1450   LEUKOCYTESUR NEGATIVE 12/25/2016 1450      Imaging Results:    Ct  Abdomen Pelvis W Contrast  Result Date: 12/25/2016 CLINICAL DATA:  Abdominal pain with nausea, vomiting, and diarrhea since noon. EXAM: CT ABDOMEN AND PELVIS WITH CONTRAST TECHNIQUE: Multidetector CT imaging of the abdomen and pelvis was performed using the standard protocol following bolus administration of intravenous contrast. CONTRAST:  178mL ISOVUE-300 IOPAMIDOL (ISOVUE-300) INJECTION 61% COMPARISON:  06/29/2016 FINDINGS: Lower chest: Mild atelectasis medially in the right middle lobe. Chronic scarring in the posterior basal segment left lower lobe. Mild cardiomegaly. Hepatobiliary: Diffuse hepatic steatosis.  Cholecystectomy. Pancreas: Unremarkable Spleen: Unremarkable Adrenals/Urinary Tract: Adrenal glands normal. Bilateral renal cysts are observed, similar to the prior exam -some of the hypodense renal lesions are technically too small to characterize. Stomach/Bowel: Scattered colonic diverticula most notable in the ascending colon and proximal transverse colon. Appendix normal. Vascular/Lymphatic: Aortoiliac atherosclerotic vascular disease. Stable mildly prominent peripancreatic lymph node at 1.3 cm on image 23/2. Reproductive: Uterus absent.  Adnexa unremarkable. Other: No supplemental non-categorized findings. 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 Transitional L5 vertebra with an unusual appearance of irregular bridging ossification from the transverse processes to the iliac bones. Multilevel degenerative facet arthropathy in the lumbar spine with mild grade 1 anterolisthesis at L3-4. Spurring causes left foraminal stenosis at the L3-4 level. IMPRESSION: 1. A cause for the patient'  s current symptoms is not identified. 2. Proximal colonic diverticulosis without active diverticulitis. 3. Bilateral renal cysts. 4. Diffuse hepatic steatosis. 5. Mild atelectasis medially in the right middle lobe. Scarring in the left lower lobe. 6. Mild enlargement of the  cardiopericardial silhouette, without basilar edema. 7. Left foraminal stenosis at L3-4 due to spurring. 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 Transitional L5 vertebra. 8.  Aortoiliac atherosclerotic vascular disease. 9. Mildly enlarged but chronically stable peripancreatic lymph node at 1.2 cm. Electronically Signed   By: Van Clines M.D.   On: 12/25/2016 20:21   Dg Chest Port 1 View  Result Date: 12/25/2016 CLINICAL DATA:  Cough.  Abdominal pain, nausea and vomiting. EXAM: PORTABLE CHEST 1 VIEW COMPARISON:  12/08/2016  chest radiograph. FINDINGS: Stable cardiomediastinal silhouette with mild cardiomegaly. No pneumothorax. No pleural effusion. No overt pulmonary edema. Hazy opacities at the lung bases bilaterally. IMPRESSION: Nonspecific hazy bibasilar lung opacities, cannot exclude aspiration or pneumonia. Consider correlation with PA and lateral chest radiographs. Recommend follow-up PA and lateral post treatment chest radiographs in 4-6 weeks. Electronically Signed   By: Ilona Sorrel M.D.   On: 12/25/2016 15:14      Assessment & Plan:    Active Problems:   Essential hypertension   Diabetes (HCC)   Intractable nausea and vomiting   1. Intractable nausea and vomiting - likely from viral gastroenteritis, will start Phenergan 12.5 IV every 6 hours when necessary.  2. Hypokalemia-replaced potassium and check BMP in a.m. 3. Diabetes mellitus- hold oral hypoglycemic and [place]. Start sliding scale insulin NovoLog. Check CBG every 6 hours 4. Cough- patient has been complaining of cough, though she completed 10 days of by mouth antibiotics for bronchitis ,will check influenza PCR. 5. Hypertension- blood pressure stable, continue amlodipine, lisinopril, metoprolol 6. Anxiety-patient takes alprazolam at home, will start IV Ativan 0.5 mg every 6 hours when necessary.   DVT  Prophylaxis-   Lovenox   AM Labs Ordered, also please review Full Orders  Family Communication:  Admission, patients condition and plan of care including tests being ordered have been discussed with the patient and heart daughter at bedside* who indicate understanding and agree with the plan and Code Status.  Code Status:  Full code   Admission status: Observation    Time spent in minutes : 60 minutes   Tregan Read S M.D on 12/25/2016 at 9:37 PM  Between 7am to 7pm - Pager - [phone]. After 7pm go to [email or web address] - password [email or web address]  [facility] - Office  [phone]

## 2016-12-25 NOTE — ED Notes (Signed)
Ct tech here for scan but pt not able to transport at this time, pt c/o nausea.

## 2016-12-25 NOTE — ED Notes (Signed)
Pt resting she has been able to keep a small amount of liquids down.

## 2016-12-26 DIAGNOSIS — E876 Hypokalemia: Secondary | ICD-10-CM | POA: Diagnosis not present

## 2016-12-26 DIAGNOSIS — E119 Type 2 diabetes mellitus without complications: Secondary | ICD-10-CM | POA: Diagnosis not present

## 2016-12-26 DIAGNOSIS — I10 Essential (primary) hypertension: Secondary | ICD-10-CM | POA: Diagnosis not present

## 2016-12-26 DIAGNOSIS — J45909 Unspecified asthma, uncomplicated: Secondary | ICD-10-CM | POA: Diagnosis not present

## 2016-12-26 DIAGNOSIS — Z7982 Long term (current) use of aspirin: Secondary | ICD-10-CM | POA: Diagnosis not present

## 2016-12-26 DIAGNOSIS — R1084 Generalized abdominal pain: Secondary | ICD-10-CM | POA: Diagnosis not present

## 2016-12-26 DIAGNOSIS — R112 Nausea with vomiting, unspecified: Secondary | ICD-10-CM | POA: Diagnosis not present

## 2016-12-26 DIAGNOSIS — Z87891 Personal history of nicotine dependence: Secondary | ICD-10-CM | POA: Diagnosis not present

## 2016-12-26 DIAGNOSIS — E1140 Type 2 diabetes mellitus with diabetic neuropathy, unspecified: Secondary | ICD-10-CM | POA: Diagnosis not present

## 2016-12-26 DIAGNOSIS — Z7984 Long term (current) use of oral hypoglycemic drugs: Secondary | ICD-10-CM | POA: Diagnosis not present

## 2016-12-26 DIAGNOSIS — G43A1 Cyclical vomiting, intractable: Secondary | ICD-10-CM | POA: Diagnosis not present

## 2016-12-26 LAB — GLUCOSE, CAPILLARY
GLUCOSE-CAPILLARY: 157 mg/dL — AB (ref 65–99)
GLUCOSE-CAPILLARY: 156 mg/dL — AB (ref 65–99)
Glucose-Capillary: 176 mg/dL — ABNORMAL HIGH (ref 65–99)

## 2016-12-26 LAB — COMPREHENSIVE METABOLIC PANEL
ALBUMIN: 2.8 g/dL — AB (ref 3.5–5.0)
ALK PHOS: 39 U/L (ref 38–126)
ALT: 23 U/L (ref 14–54)
ANION GAP: 8 (ref 5–15)
AST: 22 U/L (ref 15–41)
BUN: 10 mg/dL (ref 6–20)
CALCIUM: 7.5 mg/dL — AB (ref 8.9–10.3)
CHLORIDE: 103 mmol/L (ref 101–111)
CO2: 25 mmol/L (ref 22–32)
Creatinine, Ser: 0.51 mg/dL (ref 0.44–1.00)
GFR calc Af Amer: >60 mL/min (ref >60)
GFR calc non Af Amer: >60 mL/min (ref >60)
GLUCOSE: 144 mg/dL — AB (ref 65–99)
Potassium: 3.2 mmol/L — ABNORMAL LOW (ref 3.5–5.1)
SODIUM: 136 mmol/L (ref 135–145)
Total Bilirubin: 0.3 mg/dL (ref 0.3–1.2)
Total Protein: 6.4 g/dL — ABNORMAL LOW (ref 6.5–8.1)

## 2016-12-26 LAB — CBC
HCT: 37.2 % (ref 36.0–46.0)
HEMOGLOBIN: 12.7 g/dL (ref 12.0–15.0)
MCH: 30.3 pg (ref 26.0–34.0)
MCHC: 34.1 g/dL (ref 30.0–36.0)
MCV: 88.8 fL (ref 78.0–100.0)
Platelets: 243 10*3/uL (ref 150–400)
RBC: 4.19 MIL/uL (ref 3.87–5.11)
RDW: 14.3 % (ref 11.5–15.5)
WBC: 9.2 10*3/uL (ref 4.0–10.5)

## 2016-12-26 LAB — MAGNESIUM: MAGNESIUM: 1.5 mg/dL — AB (ref 1.7–2.4)

## 2016-12-26 LAB — MRSA PCR SCREENING: MRSA BY PCR: NEGATIVE

## 2016-12-26 MED ORDER — POTASSIUM CHLORIDE 20 MEQ/15ML (10%) PO SOLN
40.0000 meq | Freq: Once | ORAL | Status: AC
  Administered 2016-12-26: 40 meq via ORAL
  Filled 2016-12-26: qty 30

## 2016-12-26 MED ORDER — ASPIRIN EC 81 MG PO TBEC
81.0000 mg | DELAYED_RELEASE_TABLET | Freq: Every day | ORAL | Status: DC
  Administered 2016-12-26: 81 mg via ORAL
  Filled 2016-12-26: qty 1

## 2016-12-26 MED ORDER — MAGNESIUM SULFATE 2 GM/50ML IV SOLN
2.0000 g | Freq: Once | INTRAVENOUS | Status: AC
  Administered 2016-12-26: 2 g via INTRAVENOUS
  Filled 2016-12-26: qty 50

## 2016-12-26 MED ORDER — POTASSIUM CHLORIDE CRYS ER 20 MEQ PO TBCR
40.0000 meq | EXTENDED_RELEASE_TABLET | Freq: Once | ORAL | Status: AC
  Administered 2016-12-26: 40 meq via ORAL
  Filled 2016-12-26: qty 2

## 2016-12-26 MED ORDER — LORATADINE 10 MG PO TABS
10.0000 mg | ORAL_TABLET | Freq: Every day | ORAL | Status: DC
  Administered 2016-12-26: 10 mg via ORAL
  Filled 2016-12-26: qty 1

## 2016-12-26 NOTE — Discharge Summary (Signed)
[name] [id number] DOB: 1961-02-24 DOA: 12/25/2016  PCP: Glo Herring., MD  Admit date: 12/25/2016  Discharge date: 12/26/2016  Admitted From: Home   Disposition:  Home   Recommendations for Outpatient Follow-up:   Follow up with PCP in 1-2 weeks  PCP Please obtain BMP/CBC, 2 view CXR in 1week,  (see Discharge instructions)   PCP Please follow up on the following pending results: None   Home Health: None   Equipment/Devices: None  Consultations: None Discharge Condition: Stable   CODE STATUS: Full   Diet Recommendation: Low carbohydrate heart healthy   Chief Complaint  Patient presents with  . Emesis     Brief history of present illness from the day of admission and additional interim summary    [name]  is a 56 y.o. female, With history of diabetes mellitus, hypertension who came to hospital with 1 day history of intractable nausea and vomiting. As the patient around noontime today she started having nausea which was accompanied by chills, she also vomited once before coming to the hospital. Since she came to the ED she vomited 3 times. Vomiting resolved after she got Ativan and morphine. Patient says Zofran did not work for her. CT scan abdomen showed no significant abnormality.  Hospital issues addressed     1. Intractable nausea and vomiting - from viral gastroenteritis, Was treated conservatively with bowel rest, IV fluids and supportive meds, completely symptom-free now tolerating diet will be discharged home.  2. Hypokalemia and hypomagnesemia-replaced quest PCP to recheck potassium and magnesium in 2-3 days 3. Diabetes mellitus-  continue home regimen. 4. At the time of admission she had a Cough-  resolved, influenza negative, chest x-ray likely showing atelectasis,  patient is currently no cough or shortness of breath, no oxygen demand, we'll request PCP to repeat 2 view chest x-ray in 3-4 days. If she runs a fever or developed a cough may try Augmentin as clinically aspiration is possible while she was throwing up. 5. Hypertension- blood pressure stable, continue amlodipine, lisinopril, metoprolol 6. Anxiety-patient takes alprazolam at home, will start IV Ativan 0.5 mg every 6 hours when necessary.   Discharge diagnosis     Active Problems:   Essential hypertension   Diabetes (HCC)   Intractable nausea and vomiting    Discharge instructions    Discharge Instructions    Discharge instructions    Complete by:  As directed    Follow with Primary MD Glo Herring., MD in 2-3 days   Get CBC, CMP, Magnesium, 2 view chest x-ray checked  by Primary MD in 2-3 days ( we routinely change or add medications that can affect your baseline labs and fluid status, therefore we recommend that you get the mentioned basic workup next visit with your PCP, your PCP may decide not to get them or add new tests based on their clinical decision)   Activity: As tolerated with Full fall precautions use walker/cane & assistance as needed   Disposition Home    Diet:   Low carbohydrate  heart healthy.  For Heart failure patients - Check your Weight same time everyday, if you gain over 2 pounds, or you develop in leg swelling, experience more shortness of breath or chest pain, call your Primary MD immediately. Follow Cardiac Low Salt Diet and 1.5 lit/day fluid restriction.   On your next visit with your primary care physician please Get Medicines reviewed and adjusted.   Please request your Prim.MD to go over all Hospital Tests and Procedure/Radiological results at the follow up, please get all Hospital records sent to your Prim MD by signing hospital release before you go home.   If you experience worsening of your admission symptoms, develop shortness of breath,  life threatening emergency, suicidal or homicidal thoughts you must seek medical attention immediately by calling 911 or calling your MD immediately  if symptoms less severe.  You Must read complete instructions/literature along with all the possible adverse reactions/side effects for all the Medicines you take and that have been prescribed to you. Take any new Medicines after you have completely understood and accpet all the possible adverse reactions/side effects.   Do not drive, operate heavy machinery, perform activities at heights, swimming or participation in water activities or provide baby sitting services if your were admitted for syncope or siezures until you have seen by Primary MD or a Neurologist and advised to do so again.  Do not drive when taking Pain medications.    Do not take more than prescribed Pain, Sleep and Anxiety Medications  Special Instructions: If you have smoked or chewed Tobacco  in the last 2 yrs please stop smoking, stop any regular Alcohol  and or any Recreational drug use.  Wear Seat belts while driving.   Please note  You were cared for by a hospitalist during your hospital stay. If you have any questions about your discharge medications or the care you received while you were in the hospital after you are discharged, you can call the unit and asked to speak with the hospitalist on call if the hospitalist that took care of you is not available. Once you are discharged, your primary care physician will handle any further medical issues. Please note that NO REFILLS for any discharge medications will be authorized once you are discharged, as it is imperative that you return to your primary care physician (or establish a relationship with a primary care physician if you do not have one) for your aftercare needs so that they can reassess your need for medications and monitor your lab values.   Increase activity slowly    Complete by:  As directed       Discharge  Medications   Allergies as of 12/26/2016      Reactions   Barium-containing Compounds Itching, Other (See Comments)   Weakness, loss of voice   Statins Other (See Comments)   Myalgia, muscle weakness   Sulfa Antibiotics Itching, Other (See Comments)   Weakness, loss of voice      Medication List    TAKE these medications   acetaminophen 500 MG tablet Commonly known as:  TYLENOL Take 1,000 mg by mouth at bedtime as needed. For pain   ALPRAZolam 0.5 MG tablet Commonly known as:  XANAX Take 0.5 mg by mouth 2 (two) times daily as needed for anxiety.   amLODipine 10 MG tablet Commonly known as:  NORVASC Take 10 mg by mouth daily.   aspirin EC 81 MG tablet Take 81 mg by mouth daily.   BYDUREON 2 MG  Pen Generic drug:  Exenatide ER Inject 2 mg into the skin every Wednesday.   DYMISTA 137-50 MCG/ACT Susp Generic drug:  Azelastine-Fluticasone Place 1 spray into the nose 2 (two) times daily as needed (nasal congestion).   estrogens (conjugated) 0.625 MG tablet Commonly known as:  PREMARIN Take 0.625 mg by mouth daily.   fexofenadine 180 MG tablet Commonly known as:  ALLEGRA Take 180 mg by mouth daily.   ipratropium 0.06 % nasal spray Commonly known as:  ATROVENT Place 2 sprays into both nostrils 2 (two) times daily as needed for rhinitis.   losartan 25 MG tablet Commonly known as:  COZAAR Take 25 mg by mouth daily.   metFORMIN 500 MG tablet Commonly known as:  GLUCOPHAGE Take 500 mg by mouth 2 (two) times daily.   metoprolol succinate 50 MG 24 hr tablet Commonly known as:  TOPROL-XL Take 1 tablet by mouth daily.   montelukast 10 MG tablet Commonly known as:  SINGULAIR Take 10 mg by mouth at bedtime.   pantoprazole 40 MG tablet Commonly known as:  PROTONIX Take 40 mg by mouth daily.   sitaGLIPtin 100 MG tablet Commonly known as:  JANUVIA Take 100 mg by mouth daily.   traMADol 50 MG tablet Commonly known as:  ULTRAM Take 50 mg by mouth 3 (three) times  daily as needed for moderate pain or severe pain.   VICTOZA 18 MG/3ML Sopn Generic drug:  liraglutide Inject 1.2 mg into the skin daily.       Follow-up Information    Glo Herring., MD. Schedule an appointment as soon as possible for a visit in 2 day(s).   Specialty:  Internal Medicine Contact information: [address] [place] [id number] [phone]           Major procedures and Radiology Reports - PLEASE review detailed and final reports thoroughly  -         Dg Chest 2 View  Result Date: 12/08/2016 CLINICAL DATA:  Cough and wheezing for several days EXAM: CHEST  2 VIEW COMPARISON:  02/02/2016 FINDINGS: The heart size and mediastinal contours are within normal limits. Both lungs are clear. The visualized skeletal structures are unremarkable. IMPRESSION: No active cardiopulmonary disease. Electronically Signed   By: Inez Catalina M.D.   On: 12/08/2016 09:25   Ct Abdomen Pelvis W Contrast  Result Date: 12/25/2016 CLINICAL DATA:  Abdominal pain with nausea, vomiting, and diarrhea since noon. EXAM: CT ABDOMEN AND PELVIS WITH CONTRAST TECHNIQUE: Multidetector CT imaging of the abdomen and pelvis was performed using the standard protocol following bolus administration of intravenous contrast. CONTRAST:  112mL ISOVUE-300 IOPAMIDOL (ISOVUE-300) INJECTION 61% COMPARISON:  06/29/2016 FINDINGS: Lower chest: Mild atelectasis medially in the right middle lobe. Chronic scarring in the posterior basal segment left lower lobe. Mild cardiomegaly. Hepatobiliary: Diffuse hepatic steatosis.  Cholecystectomy. Pancreas: Unremarkable Spleen: Unremarkable Adrenals/Urinary Tract: Adrenal glands normal. Bilateral renal cysts are observed, similar to the prior exam -some of the hypodense renal lesions are technically too small to characterize. Stomach/Bowel: Scattered colonic diverticula most notable in the ascending colon and proximal transverse colon. Appendix normal. Vascular/Lymphatic:  Aortoiliac atherosclerotic vascular disease. Stable mildly prominent peripancreatic lymph node at 1.3 cm on image 23/2. Reproductive: Uterus absent.  Adnexa unremarkable. Other: No supplemental non-categorized findings. 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 Transitional L5 vertebra with an unusual appearance of irregular bridging ossification from the transverse processes to the iliac bones. Multilevel degenerative facet arthropathy in the lumbar spine with mild grade  1 anterolisthesis at L3-4. Spurring causes left foraminal stenosis at the L3-4 level. IMPRESSION: 1. A cause for the patient'  s current symptoms is not identified. 2. Proximal colonic diverticulosis without active diverticulitis. 3. Bilateral renal cysts. 4. Diffuse hepatic steatosis. 5. Mild atelectasis medially in the right middle lobe. Scarring in the left lower lobe. 6. Mild enlargement of the cardiopericardial silhouette, without basilar edema. 7. Left foraminal stenosis at L3-4 due to spurring. 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 Transitional L5 vertebra. 8.  Aortoiliac atherosclerotic vascular disease. 9. Mildly enlarged but chronically stable peripancreatic lymph node at 1.2 cm. Electronically Signed   By: Van Clines M.D.   On: 12/25/2016 20:21   Dg Chest Port 1 View  Result Date: 12/25/2016 CLINICAL DATA:  Cough.  Abdominal pain, nausea and vomiting. EXAM: PORTABLE CHEST 1 VIEW COMPARISON:  12/08/2016 chest radiograph. FINDINGS: Stable cardiomediastinal silhouette with mild cardiomegaly. No pneumothorax. No pleural effusion. No overt pulmonary edema. Hazy opacities at the lung bases bilaterally. IMPRESSION: Nonspecific hazy bibasilar lung opacities, cannot exclude aspiration or pneumonia. Consider correlation with PA and lateral chest radiographs. Recommend follow-up PA and lateral post treatment chest radiographs in 4-6 weeks. Electronically Signed   By: Ilona Sorrel M.D.    On: 12/25/2016 15:14    Micro Results    Recent Results (from the past 240 hour(s))  MRSA PCR Screening     Status: None   Collection Time: 12/26/16  4:11 AM  Result Value Ref Range Status   MRSA by PCR NEGATIVE NEGATIVE Final    Comment:        The GeneXpert MRSA Assay (FDA approved for NASAL specimens only), is one component of a comprehensive MRSA colonization surveillance program. It is not intended to diagnose MRSA infection nor to guide or monitor treatment for MRSA infections.     Today   Subjective    [name] today has no headache,no chest abdominal pain,no new weakness tingling or numbness, feels much better wants to go home today.     Objective   Blood pressure (!) 145/74, pulse 95, temperature 99.5 F (37.5 C), resp. rate 16, height 5\' 3"  (1.6 m), weight 113.2 kg (249 lb 9 oz), SpO2 96 %.   Intake/Output Summary (Last 24 hours) at 12/26/16 1123 Last data filed at 12/26/16 0700  Gross per 24 hour  Intake              695 ml  Output                0 ml  Net              695 ml    Exam Awake Alert, Oriented x 3, No new F.N deficits, Normal affect [place].AT,PERRAL Supple Neck,No JVD, No cervical lymphadenopathy appriciated.  Symmetrical Chest wall movement, Good air movement bilaterally, CTAB RRR,No Gallops,Rubs or new Murmurs, No Parasternal Heave +ve B.Sounds, Abd Soft, Non tender, No organomegaly appriciated, No rebound -guarding or rigidity. No Cyanosis, Clubbing or edema, No new Rash or bruise   Data Review   CBC w Diff:  Lab Results  Component Value Date   WBC 9.2 12/26/2016   HGB 12.7 12/26/2016   HCT 37.2 12/26/2016   PLT 243 12/26/2016   LYMPHOPCT 27 12/25/2016   MONOPCT 8 12/25/2016   EOSPCT 3 12/25/2016   BASOPCT 0 12/25/2016    CMP:  Lab Results  Component Value Date   NA 136 12/26/2016   K 3.2 (L) 12/26/2016  CL 103 12/26/2016   CO2 25 12/26/2016   BUN 10 12/26/2016   CREATININE 0.51 12/26/2016   PROT 6.4 (L)  12/26/2016   ALBUMIN 2.8 (L) 12/26/2016   BILITOT 0.3 12/26/2016   ALKPHOS 39 12/26/2016   AST 22 12/26/2016   ALT 23 12/26/2016  .   Total Time in preparing paper work, data evaluation and todays exam - 35 minutes  Thurnell Lose M.D on 12/26/2016 at 11:23 AM  [facility]   Office  [phone]

## 2016-12-26 NOTE — Progress Notes (Signed)
Patient was admitted to [facility] Dept [facility] on 12/26/2015 - 12/27/15.  Patient may return to work without restriction on 12/30/15.  Verbal orders for note given by Dr. Ronnie Derby.    Russ Halo RN

## 2016-12-26 NOTE — Discharge Instructions (Signed)
Follow with Primary MD Glo Herring., MD in 2-3 days   Get CBC, CMP, Magnesium , 2 view chest x-ray checked  by Primary MD in 2-3 days ( we routinely change or add medications that can affect your baseline labs and fluid status, therefore we recommend that you get the mentioned basic workup next visit with your PCP, your PCP may decide not to get them or add new tests based on their clinical decision)   Activity: As tolerated with Full fall precautions use walker/cane & assistance as needed   Disposition Home    Diet:   Low carbohydrate heart healthy.  For Heart failure patients - Check your Weight same time everyday, if you gain over 2 pounds, or you develop in leg swelling, experience more shortness of breath or chest pain, call your Primary MD immediately. Follow Cardiac Low Salt Diet and 1.5 lit/day fluid restriction.   On your next visit with your primary care physician please Get Medicines reviewed and adjusted.   Please request your Prim.MD to go over all Hospital Tests and Procedure/Radiological results at the follow up, please get all Hospital records sent to your Prim MD by signing hospital release before you go home.   If you experience worsening of your admission symptoms, develop shortness of breath, life threatening emergency, suicidal or homicidal thoughts you must seek medical attention immediately by calling 911 or calling your MD immediately  if symptoms less severe.  You Must read complete instructions/literature along with all the possible adverse reactions/side effects for all the Medicines you take and that have been prescribed to you. Take any new Medicines after you have completely understood and accpet all the possible adverse reactions/side effects.   Do not drive, operate heavy machinery, perform activities at heights, swimming or participation in water activities or provide baby sitting services if your were admitted for syncope or siezures until you have  seen by Primary MD or a Neurologist and advised to do so again.  Do not drive when taking Pain medications.    Do not take more than prescribed Pain, Sleep and Anxiety Medications  Special Instructions: If you have smoked or chewed Tobacco  in the last 2 yrs please stop smoking, stop any regular Alcohol  and or any Recreational drug use.  Wear Seat belts while driving.   Please note  You were cared for by a hospitalist during your hospital stay. If you have any questions about your discharge medications or the care you received while you were in the hospital after you are discharged, you can call the unit and asked to speak with the hospitalist on call if the hospitalist that took care of you is not available. Once you are discharged, your primary care physician will handle any further medical issues. Please note that NO REFILLS for any discharge medications will be authorized once you are discharged, as it is imperative that you return to your primary care physician (or establish a relationship with a primary care physician if you do not have one) for your aftercare needs so that they can reassess your need for medications and monitor your lab values.

## 2016-12-26 NOTE — Progress Notes (Signed)
Patient discharged home.  IV removed - WNL.  Reviewed DC instructions and medications .  No questions at this time.  Instructed to follow up with PCP in 2-3 days.  Verbalized understanding.  In NAD at this time

## 2016-12-29 DIAGNOSIS — E860 Dehydration: Secondary | ICD-10-CM | POA: Diagnosis not present

## 2016-12-29 DIAGNOSIS — Z1389 Encounter for screening for other disorder: Secondary | ICD-10-CM | POA: Diagnosis not present

## 2016-12-29 DIAGNOSIS — Z6841 Body Mass Index (BMI) 40.0 and over, adult: Secondary | ICD-10-CM | POA: Diagnosis not present

## 2016-12-29 DIAGNOSIS — K529 Noninfective gastroenteritis and colitis, unspecified: Secondary | ICD-10-CM | POA: Diagnosis not present

## 2016-12-30 MED FILL — AMLODIPINE BESYLATE 10 MG T: 10 | 90 days supply | Qty: 90 | Fill #1

## 2016-12-30 MED FILL — JANUVIA 100 MG TABLET: 100 | 90 days supply | Qty: 90 | Fill #1

## 2017-01-04 ENCOUNTER — Ambulatory Visit (HOSPITAL_COMMUNITY)
Admission: RE | Admit: 2017-01-04 | Discharge: 2017-01-04 | Disposition: A | Discharge status: Home / Self Care | Payer: 59 | Source: Ambulatory Visit | Attending: Otolaryngology | Admitting: Otolaryngology

## 2017-01-04 DIAGNOSIS — J0101 Acute recurrent maxillary sinusitis: Secondary | ICD-10-CM | POA: Insufficient documentation

## 2017-01-04 DIAGNOSIS — G939 Disorder of brain, unspecified: Secondary | ICD-10-CM | POA: Diagnosis not present

## 2017-01-04 DIAGNOSIS — J3489 Other specified disorders of nose and nasal sinuses: Secondary | ICD-10-CM | POA: Diagnosis not present

## 2017-01-05 MED FILL — MONTELUKAST SOD 10 MG TAB: 10 | 90 days supply | Qty: 90 | Fill #0

## 2017-01-07 MED FILL — ALPRAZolam 0.5 MG TABS: 0.5 | 30 days supply | Qty: 60 | Fill #0

## 2017-01-07 MED FILL — PREMARIN 0.625 MG TABLET: 0.625 | 90 days supply | Qty: 90 | Fill #3

## 2017-01-07 MED FILL — VICTOZA 18 MG/3 ML INJECT P: 18 | 45 days supply | Qty: 9 | Fill #1

## 2017-01-11 ENCOUNTER — Ambulatory Visit (INDEPENDENT_AMBULATORY_CARE_PROVIDER_SITE_OTHER): Payer: 59 | Admitting: Otolaryngology

## 2017-01-11 DIAGNOSIS — J343 Hypertrophy of nasal turbinates: Secondary | ICD-10-CM

## 2017-01-11 DIAGNOSIS — J310 Chronic rhinitis: Secondary | ICD-10-CM

## 2017-01-11 DIAGNOSIS — J342 Deviated nasal septum: Secondary | ICD-10-CM | POA: Diagnosis not present

## 2017-01-12 MED FILL — DYMISTA NASAL SPRAY: 137-50 | 30 days supply | Qty: 23 | Fill #1

## 2017-01-12 MED FILL — IPRATROPIUM 0.06% SPRAY: 0.06 | 20 days supply | Qty: 15 | Fill #1

## 2017-01-20 DIAGNOSIS — J069 Acute upper respiratory infection, unspecified: Secondary | ICD-10-CM | POA: Diagnosis not present

## 2017-01-20 DIAGNOSIS — H6123 Impacted cerumen, bilateral: Secondary | ICD-10-CM | POA: Diagnosis not present

## 2017-01-20 DIAGNOSIS — J019 Acute sinusitis, unspecified: Secondary | ICD-10-CM | POA: Diagnosis not present

## 2017-01-20 DIAGNOSIS — E119 Type 2 diabetes mellitus without complications: Secondary | ICD-10-CM | POA: Diagnosis not present

## 2017-01-20 DIAGNOSIS — Z1389 Encounter for screening for other disorder: Secondary | ICD-10-CM | POA: Diagnosis not present

## 2017-01-20 DIAGNOSIS — Z6841 Body Mass Index (BMI) 40.0 and over, adult: Secondary | ICD-10-CM | POA: Diagnosis not present

## 2017-01-20 DIAGNOSIS — J343 Hypertrophy of nasal turbinates: Secondary | ICD-10-CM | POA: Diagnosis not present

## 2017-01-20 DIAGNOSIS — K219 Gastro-esophageal reflux disease without esophagitis: Secondary | ICD-10-CM | POA: Diagnosis not present

## 2017-01-20 MED FILL — AMOX-CLAV 875-125 MG TABLET: 875-125 | 10 days supply | Qty: 20 | Fill #0

## 2017-01-21 ENCOUNTER — Ambulatory Visit (INDEPENDENT_AMBULATORY_CARE_PROVIDER_SITE_OTHER): Payer: 59 | Admitting: Otolaryngology

## 2017-01-21 DIAGNOSIS — H6123 Impacted cerumen, bilateral: Secondary | ICD-10-CM

## 2017-01-26 MED FILL — PROAIR HFA 90 MCG INHALER: 108 (90 BAS | 25 days supply | Qty: 9 | Fill #0

## 2017-02-01 MED FILL — traMADol HCL 50 MG TABS: 50 | 30 days supply | Qty: 120 | Fill #1

## 2017-02-02 MED FILL — UNIFINE PENTIPS 32GX5/32": 32G X 4 MM | 90 days supply | Qty: 100 | Fill #0

## 2017-02-02 MED FILL — UNIFINE PENTIPS 32GX5/32: 32G X 4 MM | 90 days supply | Qty: 100 | Fill #0

## 2017-02-05 DIAGNOSIS — J3089 Other allergic rhinitis: Secondary | ICD-10-CM | POA: Diagnosis not present

## 2017-02-05 DIAGNOSIS — J3081 Allergic rhinitis due to animal (cat) (dog) hair and dander: Secondary | ICD-10-CM | POA: Diagnosis not present

## 2017-02-05 DIAGNOSIS — J301 Allergic rhinitis due to pollen: Secondary | ICD-10-CM | POA: Diagnosis not present

## 2017-02-05 DIAGNOSIS — J309 Allergic rhinitis, unspecified: Secondary | ICD-10-CM | POA: Diagnosis not present

## 2017-02-05 MED FILL — METOPROLOL SUCC ER 50 MG TA: 50 | 30 days supply | Qty: 30 | Fill #0

## 2017-02-05 MED FILL — LEVOCETIRIZINE 5 MG TABLET: 5 | 30 days supply | Qty: 30 | Fill #0

## 2017-02-05 MED FILL — EPINEPHRINE 0.3 MG AUTO-INJ: 0.3 | 2 days supply | Qty: 2 | Fill #0

## 2017-02-11 DIAGNOSIS — J301 Allergic rhinitis due to pollen: Secondary | ICD-10-CM | POA: Diagnosis not present

## 2017-02-12 DIAGNOSIS — J3089 Other allergic rhinitis: Secondary | ICD-10-CM | POA: Diagnosis not present

## 2017-02-12 DIAGNOSIS — J3081 Allergic rhinitis due to animal (cat) (dog) hair and dander: Secondary | ICD-10-CM | POA: Diagnosis not present

## 2017-02-15 MED FILL — LOSARTAN POTASSIUM 25 MG TA: 25 | 90 days supply | Qty: 90 | Fill #1

## 2017-02-15 MED FILL — ALPRAZolam 0.5 MG TABS: 0.5 | 30 days supply | Qty: 60 | Fill #1

## 2017-02-15 MED FILL — PANTOPRAZOLE SOD DR 40 MG T: 40 | 90 days supply | Qty: 90 | Fill #1

## 2017-02-17 DIAGNOSIS — J301 Allergic rhinitis due to pollen: Secondary | ICD-10-CM | POA: Diagnosis not present

## 2017-02-17 DIAGNOSIS — J3081 Allergic rhinitis due to animal (cat) (dog) hair and dander: Secondary | ICD-10-CM | POA: Diagnosis not present

## 2017-02-17 DIAGNOSIS — J3089 Other allergic rhinitis: Secondary | ICD-10-CM | POA: Diagnosis not present

## 2017-02-19 MED FILL — SM ALCOHOL 70% PREP PADS: 50 days supply | Qty: 100 | Fill #0

## 2017-02-19 MED FILL — ACCU-CHEK GUIDE TEST STRIP: 50 days supply | Qty: 100 | Fill #0

## 2017-02-19 MED FILL — ACCU-CHEK FASTCLIX LANCETS: 30 days supply | Qty: 102 | Fill #0

## 2017-02-24 DIAGNOSIS — J3081 Allergic rhinitis due to animal (cat) (dog) hair and dander: Secondary | ICD-10-CM | POA: Diagnosis not present

## 2017-02-24 DIAGNOSIS — J3089 Other allergic rhinitis: Secondary | ICD-10-CM | POA: Diagnosis not present

## 2017-02-24 DIAGNOSIS — J301 Allergic rhinitis due to pollen: Secondary | ICD-10-CM | POA: Diagnosis not present

## 2017-02-26 ENCOUNTER — Other Ambulatory Visit: Payer: Self-pay | Admitting: Obstetrics and Gynecology

## 2017-02-26 DIAGNOSIS — Z1231 Encounter for screening mammogram for malignant neoplasm of breast: Secondary | ICD-10-CM

## 2017-03-01 MED FILL — DYMISTA NASAL SPRAY: 137-50 | 30 days supply | Qty: 23 | Fill #2

## 2017-03-04 MED FILL — METOPROLOL SUCC ER 50 MG TA: 50 | 30 days supply | Qty: 30 | Fill #1

## 2017-03-04 MED FILL — VICTOZA 18 MG/3 ML INJECT P: 18 | 45 days supply | Qty: 9 | Fill #2

## 2017-03-05 DIAGNOSIS — J301 Allergic rhinitis due to pollen: Secondary | ICD-10-CM | POA: Diagnosis not present

## 2017-03-05 DIAGNOSIS — J3089 Other allergic rhinitis: Secondary | ICD-10-CM | POA: Diagnosis not present

## 2017-03-05 DIAGNOSIS — J3081 Allergic rhinitis due to animal (cat) (dog) hair and dander: Secondary | ICD-10-CM | POA: Diagnosis not present

## 2017-03-17 DIAGNOSIS — Z01419 Encounter for gynecological examination (general) (routine) without abnormal findings: Secondary | ICD-10-CM | POA: Diagnosis not present

## 2017-03-17 DIAGNOSIS — Z6841 Body Mass Index (BMI) 40.0 and over, adult: Secondary | ICD-10-CM | POA: Diagnosis not present

## 2017-03-17 DIAGNOSIS — N898 Other specified noninflammatory disorders of vagina: Secondary | ICD-10-CM | POA: Diagnosis not present

## 2017-03-18 MED FILL — metroNIDAZOLE 500 MG TABS: 500 | 7 days supply | Qty: 14 | Fill #0

## 2017-03-18 MED FILL — ALPRAZolam 0.5 MG TABS: 0.5 | 30 days supply | Qty: 60 | Fill #2

## 2017-03-22 DIAGNOSIS — J3089 Other allergic rhinitis: Secondary | ICD-10-CM | POA: Diagnosis not present

## 2017-03-22 DIAGNOSIS — J301 Allergic rhinitis due to pollen: Secondary | ICD-10-CM | POA: Diagnosis not present

## 2017-03-22 DIAGNOSIS — J3081 Allergic rhinitis due to animal (cat) (dog) hair and dander: Secondary | ICD-10-CM | POA: Diagnosis not present

## 2017-03-24 ENCOUNTER — Ambulatory Visit
Admission: RE | Admit: 2017-03-24 | Discharge: 2017-03-24 | Disposition: A | Discharge status: Home / Self Care | Payer: 59 | Source: Ambulatory Visit | Attending: Obstetrics and Gynecology | Admitting: Obstetrics and Gynecology

## 2017-03-24 DIAGNOSIS — Z1231 Encounter for screening mammogram for malignant neoplasm of breast: Secondary | ICD-10-CM

## 2017-03-24 MED FILL — FLUCONAZOLE 150 MG TABLET: 150 | 1 days supply | Qty: 1 | Fill #0

## 2017-03-25 ENCOUNTER — Other Ambulatory Visit: Payer: Self-pay | Admitting: Obstetrics and Gynecology

## 2017-03-25 DIAGNOSIS — R928 Other abnormal and inconclusive findings on diagnostic imaging of breast: Secondary | ICD-10-CM

## 2017-03-29 MED FILL — metFORMIN HCL 500 MG TABS: 500 | 90 days supply | Qty: 180 | Fill #1

## 2017-04-02 ENCOUNTER — Other Ambulatory Visit: Payer: Self-pay | Admitting: Obstetrics and Gynecology

## 2017-04-02 ENCOUNTER — Ambulatory Visit
Admission: RE | Admit: 2017-04-02 | Discharge: 2017-04-02 | Disposition: A | Discharge status: Home / Self Care | Payer: 59 | Source: Ambulatory Visit | Attending: Obstetrics and Gynecology | Admitting: Obstetrics and Gynecology

## 2017-04-02 ENCOUNTER — Other Ambulatory Visit: Payer: Self-pay | Admitting: Diagnostic Radiology

## 2017-04-02 ENCOUNTER — Other Ambulatory Visit (HOSPITAL_COMMUNITY)
Admission: RE | Admit: 2017-04-02 | Discharge: 2017-04-02 | Disposition: A | Discharge status: Home / Self Care | Payer: 59 | Source: Ambulatory Visit | Attending: Diagnostic Radiology | Admitting: Diagnostic Radiology

## 2017-04-02 DIAGNOSIS — N6489 Other specified disorders of breast: Secondary | ICD-10-CM | POA: Diagnosis not present

## 2017-04-02 DIAGNOSIS — R599 Enlarged lymph nodes, unspecified: Secondary | ICD-10-CM

## 2017-04-02 DIAGNOSIS — D360 Benign neoplasm of lymph nodes: Secondary | ICD-10-CM | POA: Diagnosis not present

## 2017-04-02 DIAGNOSIS — R928 Other abnormal and inconclusive findings on diagnostic imaging of breast: Secondary | ICD-10-CM | POA: Diagnosis not present

## 2017-04-02 DIAGNOSIS — R590 Localized enlarged lymph nodes: Secondary | ICD-10-CM | POA: Diagnosis not present

## 2017-04-05 ENCOUNTER — Ambulatory Visit: Payer: 59 | Admitting: Nutrition

## 2017-04-05 MED FILL — FLUCONAZOLE 150 MG TABLET: 150 | 1 days supply | Qty: 1 | Fill #1

## 2017-04-05 MED FILL — MONTELUKAST SOD 10 MG TAB: 10 | 90 days supply | Qty: 90 | Fill #1

## 2017-04-05 MED FILL — METOPROLOL SUCC ER 50 MG TA: 50 | 30 days supply | Qty: 30 | Fill #2

## 2017-04-05 MED FILL — PREMARIN 0.625 MG TABLET: 0.625 | 90 days supply | Qty: 90 | Fill #0

## 2017-04-05 MED FILL — JANUVIA 100 MG TABLET: 100 | 90 days supply | Qty: 90 | Fill #0

## 2017-04-05 MED FILL — AMLODIPINE BESYLATE 10 MG T: 10 | 90 days supply | Qty: 90 | Fill #2

## 2017-04-09 MED FILL — VICTOZA 18 MG/3 ML INJECT P: 18 | 45 days supply | Qty: 9 | Fill #3

## 2017-04-13 MED FILL — ACCU-CHEK GUIDE TEST STRIP: 50 days supply | Qty: 100 | Fill #0

## 2017-04-21 DIAGNOSIS — E119 Type 2 diabetes mellitus without complications: Secondary | ICD-10-CM | POA: Diagnosis not present

## 2017-04-21 DIAGNOSIS — Z6841 Body Mass Index (BMI) 40.0 and over, adult: Secondary | ICD-10-CM | POA: Diagnosis not present

## 2017-04-21 DIAGNOSIS — E782 Mixed hyperlipidemia: Secondary | ICD-10-CM | POA: Diagnosis not present

## 2017-04-21 DIAGNOSIS — I10 Essential (primary) hypertension: Secondary | ICD-10-CM | POA: Diagnosis not present

## 2017-04-21 DIAGNOSIS — Z23 Encounter for immunization: Secondary | ICD-10-CM | POA: Diagnosis not present

## 2017-04-21 DIAGNOSIS — K219 Gastro-esophageal reflux disease without esophagitis: Secondary | ICD-10-CM | POA: Diagnosis not present

## 2017-04-21 DIAGNOSIS — Z1389 Encounter for screening for other disorder: Secondary | ICD-10-CM | POA: Diagnosis not present

## 2017-04-22 ENCOUNTER — Other Ambulatory Visit: Payer: Self-pay | Admitting: *Deleted

## 2017-04-22 NOTE — Patient Outreach (Signed)
Per messages in the [facility] platform, [name] says she saw her primary care provider on 5/9 and her Victoza was stopped due to nausea and diarrhea and her Metformin dosage was increased to 1000 mg twice daily from 500 mg twice daily. This RNCM suggested she check a post meal blood sugar after her biggest meal to ensure she is meeting the target of <180 so that she can continue to improve her A1C, now 7.4%, in the absence of the Victoza.  Barrington Ellison RN,CCM,CDE [place] Management Coordinator [facility] Office Phone [phone] Office Fax [phone]

## 2017-04-28 MED FILL — ALPRAZolam 0.5 MG TABS: 0.5 | 30 days supply | Qty: 60 | Fill #0

## 2017-05-06 MED FILL — METOPROLOL SUCC ER 50 MG TA: 50 | 30 days supply | Qty: 30 | Fill #0

## 2017-05-18 MED FILL — PANTOPRAZOLE SOD DR 40 MG T: 40 | 90 days supply | Qty: 90 | Fill #2

## 2017-05-18 MED FILL — LOSARTAN POTASSIUM 25 MG TA: 25 | 90 days supply | Qty: 90 | Fill #2

## 2017-05-21 DIAGNOSIS — R635 Abnormal weight gain: Secondary | ICD-10-CM | POA: Diagnosis not present

## 2017-05-21 DIAGNOSIS — N951 Menopausal and female climacteric states: Secondary | ICD-10-CM | POA: Diagnosis not present

## 2017-05-25 DIAGNOSIS — R4586 Emotional lability: Secondary | ICD-10-CM | POA: Diagnosis not present

## 2017-05-25 DIAGNOSIS — K219 Gastro-esophageal reflux disease without esophagitis: Secondary | ICD-10-CM | POA: Diagnosis not present

## 2017-05-25 DIAGNOSIS — R5383 Other fatigue: Secondary | ICD-10-CM | POA: Diagnosis not present

## 2017-05-25 DIAGNOSIS — R232 Flushing: Secondary | ICD-10-CM | POA: Diagnosis not present

## 2017-05-25 DIAGNOSIS — E109 Type 1 diabetes mellitus without complications: Secondary | ICD-10-CM | POA: Diagnosis not present

## 2017-05-25 DIAGNOSIS — I10 Essential (primary) hypertension: Secondary | ICD-10-CM | POA: Diagnosis not present

## 2017-05-25 DIAGNOSIS — E559 Vitamin D deficiency, unspecified: Secondary | ICD-10-CM | POA: Diagnosis not present

## 2017-05-25 DIAGNOSIS — E781 Pure hyperglyceridemia: Secondary | ICD-10-CM | POA: Diagnosis not present

## 2017-05-27 MED FILL — FLUCONAZOLE 150 MG TABLET: 150 | 1 days supply | Qty: 1 | Fill #2

## 2017-05-27 MED FILL — ALPRAZolam 0.5 MG TABS: 0.5 | 30 days supply | Qty: 60 | Fill #1

## 2017-06-01 DIAGNOSIS — E559 Vitamin D deficiency, unspecified: Secondary | ICD-10-CM | POA: Diagnosis not present

## 2017-06-01 DIAGNOSIS — E8881 Metabolic syndrome: Secondary | ICD-10-CM | POA: Diagnosis not present

## 2017-06-10 MED FILL — METOPROLOL SUCC ER 50 MG TA: 50 | 90 days supply | Qty: 90 | Fill #1

## 2017-06-18 DIAGNOSIS — K219 Gastro-esophageal reflux disease without esophagitis: Secondary | ICD-10-CM | POA: Diagnosis not present

## 2017-06-18 DIAGNOSIS — E8881 Metabolic syndrome: Secondary | ICD-10-CM | POA: Diagnosis not present

## 2017-06-18 DIAGNOSIS — E559 Vitamin D deficiency, unspecified: Secondary | ICD-10-CM | POA: Diagnosis not present

## 2017-06-25 DIAGNOSIS — E559 Vitamin D deficiency, unspecified: Secondary | ICD-10-CM | POA: Diagnosis not present

## 2017-06-25 DIAGNOSIS — K219 Gastro-esophageal reflux disease without esophagitis: Secondary | ICD-10-CM | POA: Diagnosis not present

## 2017-06-25 DIAGNOSIS — E8881 Metabolic syndrome: Secondary | ICD-10-CM | POA: Diagnosis not present

## 2017-06-28 MED FILL — PREMARIN 0.625 MG TABLET: 0.625 | 90 days supply | Qty: 90 | Fill #1

## 2017-06-28 MED FILL — ALPRAZolam 0.5 MG TABS: 0.5 | 30 days supply | Qty: 60 | Fill #2

## 2017-07-02 DIAGNOSIS — K219 Gastro-esophageal reflux disease without esophagitis: Secondary | ICD-10-CM | POA: Diagnosis not present

## 2017-07-02 DIAGNOSIS — E8881 Metabolic syndrome: Secondary | ICD-10-CM | POA: Diagnosis not present

## 2017-07-02 DIAGNOSIS — E559 Vitamin D deficiency, unspecified: Secondary | ICD-10-CM | POA: Diagnosis not present

## 2017-07-05 MED FILL — metFORMIN HCL 500 MG TABS: 500 | 90 days supply | Qty: 180 | Fill #2

## 2017-07-05 MED FILL — AMLODIPINE BESYLATE 10 MG T: 10 | 90 days supply | Qty: 90 | Fill #3

## 2017-07-05 MED FILL — JANUVIA 100 MG TABLET: 100 | 90 days supply | Qty: 90 | Fill #1

## 2017-07-05 MED FILL — MONTELUKAST SOD 10 MG TAB: 10 | 90 days supply | Qty: 90 | Fill #2

## 2017-07-07 MED FILL — ACCU-CHEK GUIDE TEST STRIP: 25 days supply | Qty: 50 | Fill #0

## 2017-07-07 MED FILL — ACCU-CHEK FASTCLIX LANCETS: 50 days supply | Qty: 102 | Fill #0

## 2017-07-08 DIAGNOSIS — E119 Type 2 diabetes mellitus without complications: Secondary | ICD-10-CM | POA: Diagnosis not present

## 2017-07-08 DIAGNOSIS — H5213 Myopia, bilateral: Secondary | ICD-10-CM | POA: Diagnosis not present

## 2017-08-05 MED FILL — LOSARTAN POTASSIUM 25 MG TA: 25 | 90 days supply | Qty: 90 | Fill #3

## 2017-08-05 MED FILL — ACCU-CHEK GUIDE TEST STRIP: 25 days supply | Qty: 50 | Fill #1

## 2017-08-05 MED FILL — PANTOPRAZOLE SOD DR 40 MG T: 40 | 90 days supply | Qty: 90 | Fill #3

## 2017-08-11 DIAGNOSIS — J329 Chronic sinusitis, unspecified: Secondary | ICD-10-CM | POA: Diagnosis not present

## 2017-08-11 DIAGNOSIS — M1991 Primary osteoarthritis, unspecified site: Secondary | ICD-10-CM | POA: Diagnosis not present

## 2017-08-11 DIAGNOSIS — E119 Type 2 diabetes mellitus without complications: Secondary | ICD-10-CM | POA: Diagnosis not present

## 2017-08-11 DIAGNOSIS — Z1389 Encounter for screening for other disorder: Secondary | ICD-10-CM | POA: Diagnosis not present

## 2017-08-11 DIAGNOSIS — Z6841 Body Mass Index (BMI) 40.0 and over, adult: Secondary | ICD-10-CM | POA: Diagnosis not present

## 2017-08-17 MED FILL — FLUCONAZOLE 150 MG TABLET: 150 | 1 days supply | Qty: 1 | Fill #3

## 2017-08-24 MED FILL — LEVOCETIRIZINE 5 MG TABLET: 5 | 30 days supply | Qty: 30 | Fill #1

## 2017-08-26 DIAGNOSIS — I10 Essential (primary) hypertension: Secondary | ICD-10-CM | POA: Diagnosis not present

## 2017-08-26 DIAGNOSIS — R350 Frequency of micturition: Secondary | ICD-10-CM | POA: Diagnosis not present

## 2017-08-26 DIAGNOSIS — Z6841 Body Mass Index (BMI) 40.0 and over, adult: Secondary | ICD-10-CM | POA: Diagnosis not present

## 2017-08-26 DIAGNOSIS — R3915 Urgency of urination: Secondary | ICD-10-CM | POA: Diagnosis not present

## 2017-08-26 DIAGNOSIS — Z1389 Encounter for screening for other disorder: Secondary | ICD-10-CM | POA: Diagnosis not present

## 2017-08-26 DIAGNOSIS — E669 Obesity, unspecified: Secondary | ICD-10-CM | POA: Diagnosis not present

## 2017-08-26 DIAGNOSIS — E119 Type 2 diabetes mellitus without complications: Secondary | ICD-10-CM | POA: Diagnosis not present

## 2017-08-26 DIAGNOSIS — M545 Low back pain: Secondary | ICD-10-CM | POA: Diagnosis not present

## 2017-08-26 DIAGNOSIS — E782 Mixed hyperlipidemia: Secondary | ICD-10-CM | POA: Diagnosis not present

## 2017-08-26 MED FILL — METFORMIN HCL ER 500 MG TAB: 500 | 90 days supply | Qty: 180 | Fill #0

## 2017-08-30 MED FILL — METOPROLOL SUCC ER 50 MG TA: 50 | 90 days supply | Qty: 90 | Fill #0

## 2017-09-01 MED FILL — ACCU-CHEK FASTCLIX LANCETS: 50 days supply | Qty: 102 | Fill #1

## 2017-09-01 MED FILL — ACCU-CHEK GUIDE TEST STRIP: 25 days supply | Qty: 50 | Fill #2

## 2017-09-01 MED FILL — ALPRAZolam 0.5 MG TABS: 0.5 | 30 days supply | Qty: 60 | Fill #0

## 2017-10-01 MED FILL — PREMARIN 0.625 MG TABLET: 0.625 | 90 days supply | Qty: 90 | Fill #2

## 2017-10-01 MED FILL — DYMISTA NASAL SPRAY: 137-50 | 30 days supply | Qty: 23 | Fill #3

## 2017-10-04 MED FILL — ALPRAZolam 0.5 MG TABS: 0.5 | 30 days supply | Qty: 60 | Fill #1

## 2017-10-04 MED FILL — LEVOCETIRIZINE 5 MG TABLET: 5 | 30 days supply | Qty: 30 | Fill #2

## 2017-10-04 MED FILL — ACCU-CHEK GUIDE TEST STRIP: 25 days supply | Qty: 50 | Fill #3

## 2017-10-06 DIAGNOSIS — E119 Type 2 diabetes mellitus without complications: Secondary | ICD-10-CM | POA: Diagnosis not present

## 2017-10-06 DIAGNOSIS — Z1389 Encounter for screening for other disorder: Secondary | ICD-10-CM | POA: Diagnosis not present

## 2017-10-06 DIAGNOSIS — J042 Acute laryngotracheitis: Secondary | ICD-10-CM | POA: Diagnosis not present

## 2017-10-06 DIAGNOSIS — E782 Mixed hyperlipidemia: Secondary | ICD-10-CM | POA: Diagnosis not present

## 2017-10-06 DIAGNOSIS — M1991 Primary osteoarthritis, unspecified site: Secondary | ICD-10-CM | POA: Diagnosis not present

## 2017-10-06 DIAGNOSIS — Z6841 Body Mass Index (BMI) 40.0 and over, adult: Secondary | ICD-10-CM | POA: Diagnosis not present

## 2017-10-06 MED FILL — AMOX-CLAV 875-125 MG TABLET: 875-125 | 10 days supply | Qty: 20 | Fill #0

## 2017-10-14 MED FILL — AMLODIPINE BESYLATE 10 MG T: 10 | 90 days supply | Qty: 90 | Fill #0

## 2017-10-14 MED FILL — JANUVIA 100 MG TABLET: 100 | 90 days supply | Qty: 90 | Fill #2

## 2017-11-01 MED FILL — MONTELUKAST SOD 10 MG TAB: 10 | 90 days supply | Qty: 90 | Fill #0

## 2017-11-08 MED FILL — LOSARTAN POTASSIUM 25 MG TA: 25 | 90 days supply | Qty: 90 | Fill #0

## 2017-11-08 MED FILL — ACCU-CHEK GUIDE TEST STRIP: 25 days supply | Qty: 50 | Fill #4

## 2017-11-08 MED FILL — ALPRAZolam 0.5 MG TABS: 0.5 | 30 days supply | Qty: 60 | Fill #2

## 2017-11-08 MED FILL — PANTOPRAZOLE SOD DR 40 MG T: 40 | 90 days supply | Qty: 90 | Fill #0

## 2017-11-12 ENCOUNTER — Other Ambulatory Visit: Payer: Self-pay | Admitting: *Deleted

## 2017-11-12 NOTE — Patient Outreach (Signed)
Pam transitioned from the [employer] [facility] program to the [employer] on 01/01/17 for Type II diabetes self-management assistance so will close case to the diabetes [facility] program due to delegation of disease management services to [facility] from [employer] for [place] members in 2019. Barrington Ellison RN,CCM,CDE [place] Management Coordinator [facility] and [employer] [phone] Office Fax [phone]

## 2017-11-16 MED FILL — LEVOCETIRIZINE 5 MG TABLET: 5 | 30 days supply | Qty: 30 | Fill #3

## 2017-11-29 DIAGNOSIS — Z1389 Encounter for screening for other disorder: Secondary | ICD-10-CM | POA: Diagnosis not present

## 2017-11-29 DIAGNOSIS — E782 Mixed hyperlipidemia: Secondary | ICD-10-CM | POA: Diagnosis not present

## 2017-11-29 DIAGNOSIS — E785 Hyperlipidemia, unspecified: Secondary | ICD-10-CM | POA: Diagnosis not present

## 2017-11-29 DIAGNOSIS — Z6841 Body Mass Index (BMI) 40.0 and over, adult: Secondary | ICD-10-CM | POA: Diagnosis not present

## 2017-12-08 MED FILL — ACCU-CHEK FASTCLIX LANCETS: 50 days supply | Qty: 102 | Fill #2

## 2017-12-08 MED FILL — ALPRAZolam 0.5 MG TABS: 0.5 | 30 days supply | Qty: 60 | Fill #0

## 2017-12-08 MED FILL — METFORMIN HCL ER 500 MG TAB: 500 | 90 days supply | Qty: 180 | Fill #1

## 2017-12-08 MED FILL — ACCU-CHEK GUIDE TEST STRIP: 25 days supply | Qty: 50 | Fill #5

## 2017-12-10 MED FILL — METOPROLOL TARTRATE 50 MG T: 50 | 90 days supply | Qty: 180 | Fill #0

## 2017-12-10 MED FILL — METOPROLOL SUCC ER 50 MG TA: 50 | 30 days supply | Qty: 30 | Fill #0

## 2017-12-15 MED FILL — LEVOCETIRIZINE 5 MG TABLET: 5 | 30 days supply | Qty: 30 | Fill #0

## 2017-12-30 ENCOUNTER — Other Ambulatory Visit (HOSPITAL_COMMUNITY): Payer: Self-pay | Admitting: Internal Medicine

## 2017-12-30 ENCOUNTER — Encounter (HOSPITAL_COMMUNITY): Payer: Self-pay

## 2017-12-30 ENCOUNTER — Ambulatory Visit (HOSPITAL_COMMUNITY): Payer: 59

## 2017-12-30 DIAGNOSIS — R109 Unspecified abdominal pain: Secondary | ICD-10-CM

## 2017-12-30 DIAGNOSIS — R300 Dysuria: Secondary | ICD-10-CM

## 2017-12-30 DIAGNOSIS — E119 Type 2 diabetes mellitus without complications: Secondary | ICD-10-CM | POA: Diagnosis not present

## 2017-12-30 DIAGNOSIS — I10 Essential (primary) hypertension: Secondary | ICD-10-CM | POA: Diagnosis not present

## 2017-12-30 DIAGNOSIS — M1991 Primary osteoarthritis, unspecified site: Secondary | ICD-10-CM | POA: Diagnosis not present

## 2017-12-30 DIAGNOSIS — Z1389 Encounter for screening for other disorder: Secondary | ICD-10-CM | POA: Diagnosis not present

## 2017-12-30 DIAGNOSIS — Z6841 Body Mass Index (BMI) 40.0 and over, adult: Secondary | ICD-10-CM | POA: Diagnosis not present

## 2017-12-30 DIAGNOSIS — E782 Mixed hyperlipidemia: Secondary | ICD-10-CM | POA: Diagnosis not present

## 2017-12-30 DIAGNOSIS — R1012 Left upper quadrant pain: Secondary | ICD-10-CM | POA: Diagnosis not present

## 2017-12-30 DIAGNOSIS — K219 Gastro-esophageal reflux disease without esophagitis: Secondary | ICD-10-CM | POA: Diagnosis not present

## 2018-01-02 ENCOUNTER — Encounter (HOSPITAL_COMMUNITY): Payer: Self-pay | Admitting: Emergency Medicine

## 2018-01-02 ENCOUNTER — Other Ambulatory Visit: Payer: Self-pay

## 2018-01-02 ENCOUNTER — Emergency Department (HOSPITAL_COMMUNITY)
Admission: EM | Admit: 2018-01-02 | Discharge: 2018-01-02 | Disposition: A | Discharge status: Home / Self Care | Payer: 59 | Attending: Emergency Medicine | Admitting: Emergency Medicine

## 2018-01-02 ENCOUNTER — Emergency Department (HOSPITAL_COMMUNITY): Payer: 59

## 2018-01-02 DIAGNOSIS — I10 Essential (primary) hypertension: Secondary | ICD-10-CM | POA: Diagnosis not present

## 2018-01-02 DIAGNOSIS — J45909 Unspecified asthma, uncomplicated: Secondary | ICD-10-CM | POA: Diagnosis not present

## 2018-01-02 DIAGNOSIS — R197 Diarrhea, unspecified: Secondary | ICD-10-CM | POA: Insufficient documentation

## 2018-01-02 DIAGNOSIS — Z87891 Personal history of nicotine dependence: Secondary | ICD-10-CM | POA: Diagnosis not present

## 2018-01-02 DIAGNOSIS — R109 Unspecified abdominal pain: Secondary | ICD-10-CM | POA: Diagnosis not present

## 2018-01-02 DIAGNOSIS — R110 Nausea: Secondary | ICD-10-CM | POA: Diagnosis not present

## 2018-01-02 DIAGNOSIS — Z7984 Long term (current) use of oral hypoglycemic drugs: Secondary | ICD-10-CM | POA: Diagnosis not present

## 2018-01-02 DIAGNOSIS — Z7982 Long term (current) use of aspirin: Secondary | ICD-10-CM | POA: Diagnosis not present

## 2018-01-02 DIAGNOSIS — Z79899 Other long term (current) drug therapy: Secondary | ICD-10-CM | POA: Diagnosis not present

## 2018-01-02 DIAGNOSIS — R1012 Left upper quadrant pain: Secondary | ICD-10-CM | POA: Insufficient documentation

## 2018-01-02 DIAGNOSIS — E119 Type 2 diabetes mellitus without complications: Secondary | ICD-10-CM | POA: Diagnosis not present

## 2018-01-02 DIAGNOSIS — R1032 Left lower quadrant pain: Secondary | ICD-10-CM | POA: Diagnosis not present

## 2018-01-02 LAB — COMPREHENSIVE METABOLIC PANEL
ALBUMIN: 3.8 g/dL (ref 3.5–5.0)
ALK PHOS: 53 U/L (ref 38–126)
ALT: 19 U/L (ref 14–54)
AST: 26 U/L (ref 15–41)
Anion gap: 13 (ref 5–15)
BILIRUBIN TOTAL: 0.5 mg/dL (ref 0.3–1.2)
BUN: 7 mg/dL (ref 6–20)
CALCIUM: 9.2 mg/dL (ref 8.9–10.3)
CO2: 24 mmol/L (ref 22–32)
CREATININE: 0.56 mg/dL (ref 0.44–1.00)
Chloride: 102 mmol/L (ref 101–111)
GFR calc Af Amer: >60 mL/min (ref >60)
GFR calc non Af Amer: >60 mL/min (ref >60)
GLUCOSE: 116 mg/dL — AB (ref 65–99)
POTASSIUM: 3.3 mmol/L — AB (ref 3.5–5.1)
Sodium: 139 mmol/L (ref 135–145)
TOTAL PROTEIN: 7.9 g/dL (ref 6.5–8.1)

## 2018-01-02 LAB — URINALYSIS, ROUTINE W REFLEX MICROSCOPIC
Bilirubin Urine: NEGATIVE
GLUCOSE, UA: NEGATIVE mg/dL
HGB URINE DIPSTICK: NEGATIVE
Ketones, ur: NEGATIVE mg/dL
LEUKOCYTES UA: NEGATIVE
Nitrite: NEGATIVE
PROTEIN: NEGATIVE mg/dL
Specific Gravity, Urine: 1.003 — ABNORMAL LOW (ref 1.005–1.030)
pH: 8 (ref 5.0–8.0)

## 2018-01-02 LAB — CBC WITH DIFFERENTIAL/PLATELET
BASOS ABS: 0 10*3/uL (ref 0.0–0.1)
Basophils Relative: 0 %
EOS PCT: 5 %
Eosinophils Absolute: 0.5 10*3/uL (ref 0.0–0.7)
HCT: 39.4 % (ref 36.0–46.0)
Hemoglobin: 13 g/dL (ref 12.0–15.0)
LYMPHS PCT: 37 %
Lymphs Abs: 3.6 10*3/uL (ref 0.7–4.0)
MCH: 29.4 pg (ref 26.0–34.0)
MCHC: 33 g/dL (ref 30.0–36.0)
MCV: 89.1 fL (ref 78.0–100.0)
MONO ABS: 1.1 10*3/uL — AB (ref 0.1–1.0)
Monocytes Relative: 11 %
Neutro Abs: 4.6 10*3/uL (ref 1.7–7.7)
Neutrophils Relative %: 47 %
PLATELETS: 293 10*3/uL (ref 150–400)
RBC: 4.42 MIL/uL (ref 3.87–5.11)
RDW: 13.1 % (ref 11.5–15.5)
WBC: 9.9 10*3/uL (ref 4.0–10.5)

## 2018-01-02 LAB — LIPASE, BLOOD: Lipase: 23 U/L (ref 11–51)

## 2018-01-02 MED ORDER — ONDANSETRON HCL 4 MG/2ML IJ SOLN
4.0000 mg | Freq: Once | INTRAMUSCULAR | Status: AC
  Administered 2018-01-02: 4 mg via INTRAVENOUS
  Filled 2018-01-02: qty 2

## 2018-01-02 MED ORDER — SODIUM CHLORIDE 0.9 % IV BOLUS (SEPSIS)
1000.0000 mL | Freq: Once | INTRAVENOUS | Status: AC
  Administered 2018-01-02: 1000 mL via INTRAVENOUS

## 2018-01-02 MED ORDER — OMEPRAZOLE 20 MG PO CPDR: 20.0000 mg | DELAYED_RELEASE_CAPSULE | Freq: Every day | ORAL | 0 refills | Status: DC

## 2018-01-02 MED ORDER — IOPAMIDOL (ISOVUE-300) INJECTION 61%
100.0000 mL | Freq: Once | INTRAVENOUS | Status: AC | PRN
  Administered 2018-01-02: 100 mL via INTRAVENOUS

## 2018-01-02 MED ORDER — TRAMADOL HCL 50 MG PO TABS: 50.0000 mg | ORAL_TABLET | Freq: Four times a day (QID) | ORAL | 0 refills | Status: DC | PRN

## 2018-01-02 MED ORDER — HYDROMORPHONE HCL 1 MG/ML IJ SOLN
0.5000 mg | INTRAMUSCULAR | Status: DC | PRN
  Administered 2018-01-02: 0.5 mg via INTRAVENOUS
  Filled 2018-01-02: qty 1

## 2018-01-02 MED ORDER — SODIUM CHLORIDE 0.9 % IV SOLN: INTRAVENOUS | Status: DC

## 2018-01-02 NOTE — ED Provider Notes (Signed)
[facility] EMERGENCY DEPARTMENT Provider Note   CSN: [id number] Arrival date & time: 01/02/18  1543     History   Chief Complaint Chief Complaint  Patient presents with  . Abdominal Pain    HPI [name] is a 57 y.o. female.  HPI The patient presents to the emergency room for persistent abdominal pain.  Patient started having left-sided abdominal pain earlier in the week.  It is in the upper and middle portion of the left side of her abdomen.  She has had some nausea with it as well and a little bit of diarrhea.  She went to see her primary doctor who empirically started treating for diverticulitis.  Patient was given the option of a CT scan but she did not want to do that initially.  She has been on the antibiotics now for a couple of days and unfortunately her symptoms are not getting any better.  The pain is a burning pain in her left side of her abdomen.  No fevers.  No dysuria. Past Medical History:  Diagnosis Date  . Anxiety   . Asthma    diagnosed as an adult  . Diabetes mellitus   . Hypertension     Patient Active Problem List   Diagnosis Date Noted  . Intractable nausea and vomiting 12/25/2016  . Diabetes ([place]) 09/02/2016  . Asthma 09/02/2016  . MORBID OBESITY 07/02/2009  . Essential hypertension 07/02/2009  . CHEST DISCOMFORT 07/02/2009    Past Surgical History:  Procedure Laterality Date  . ABDOMINAL HYSTERECTOMY    . CARPAL TUNNEL RELEASE    . CESAREAN SECTION    . CHOLECYSTECTOMY    . SINUS EXPLORATION    . TRIGGER FINGER RELEASE    . WRIST SURGERY      OB History    No data available       Home Medications    Prior to Admission medications   Medication Sig Start Date End Date Taking? Authorizing Provider  acetaminophen (TYLENOL) 500 MG tablet Take 1,000 mg by mouth at bedtime as needed. For pain   Yes [provider]  ALPRAZolam (XANAX) 0.5 MG tablet Take 0.5 mg by mouth 2 (two) times daily as needed for anxiety.    Yes  [provider]  amLODipine (NORVASC) 10 MG tablet Take 10 mg by mouth daily.   Yes [provider]  aspirin EC 81 MG tablet Take 81 mg by mouth daily.   Yes [provider]  Azelastine-Fluticasone (DYMISTA) 137-50 MCG/ACT SUSP Place 1 spray into the nose 2 (two) times daily as needed (nasal congestion).   Yes [provider]  ciprofloxacin (CIPRO) 500 MG tablet Take 500 mg by mouth 2 (two) times daily. 7 day course starting on 12/30/2017 12/30/17  Yes [provider]  estrogens, conjugated, (PREMARIN) 0.625 MG tablet Take 0.625 mg by mouth daily.    Yes [provider]  ipratropium (ATROVENT) 0.06 % nasal spray Place 2 sprays into both nostrils 2 (two) times daily as needed for rhinitis.   Yes [provider]  levocetirizine (XYZAL) 5 MG tablet Take 5 mg by mouth daily.   Yes [provider]  losartan (COZAAR) 25 MG tablet Take 25 mg by mouth daily.   Yes [provider]  metFORMIN (GLUCOPHAGE) 500 MG tablet Take 500 mg by mouth 2 (two) times daily. 01/14/16  Yes [provider]  metoprolol succinate (TOPROL-XL) 50 MG 24 hr tablet Take 1 tablet by mouth daily. 05/26/16  Yes [provider]  metroNIDAZOLE (FLAGYL) 500 MG tablet Take 500 mg by mouth 4 (four) times daily. 10 day course starting on 12/30/2017 12/30/17  Yes [provider]  montelukast (SINGULAIR) 10 MG tablet Take 10 mg by mouth at bedtime.   Yes [provider]  pantoprazole (PROTONIX) 40 MG tablet Take 40 mg by mouth daily.   Yes [provider]  sitaGLIPtin (JANUVIA) 100 MG tablet Take 100 mg by mouth daily.   Yes [provider]  omeprazole (PRILOSEC) 20 MG capsule Take 1 capsule (20 mg total) by mouth daily. 01/02/18   Dorie Rank, MD  traMADol (ULTRAM) 50 MG tablet Take 1 tablet (50 mg total) by mouth every 6 (six) hours as needed. 01/02/18   Dorie Rank, MD    Family History Family History  Problem  Relation Age of Onset  . Hypertension Mother   . Diabetes Mother   . Stroke Mother     Social History Social History   Tobacco Use  . Smoking status: Former Research scientist (life sciences)  . Smokeless tobacco: Never Used  Substance Use Topics  . Alcohol use: No    Alcohol/week: 0.0 oz  . Drug use: No     Allergies   Barium-containing compounds; Statins; Sulfa antibiotics; and Victoza [liraglutide]   Review of Systems Review of Systems  All other systems reviewed and are negative.    Physical Exam Updated Vital Signs BP (!) 142/74   Pulse 72   Temp 97.8 F (36.6 C) (Oral)   Resp 14   Ht 1.6 m (5\' 3" )   Wt 108.9 kg (240 lb)   SpO2 99%   BMI 42.51 kg/m   Physical Exam  Constitutional: She appears well-developed and well-nourished. No distress.  HENT:  Head: Normocephalic and atraumatic.  Right Ear: External ear normal.  Left Ear: External ear normal.  Eyes: Conjunctivae are normal. Right eye exhibits no discharge. Left eye exhibits no discharge. No scleral icterus.  Neck: Neck supple. No tracheal deviation present.  Cardiovascular: Normal rate, regular rhythm and intact distal pulses.  Pulmonary/Chest: Effort normal and breath sounds normal. No stridor. No respiratory distress. She has no wheezes. She has no rales.  Abdominal: Soft. Bowel sounds are normal. She exhibits no distension and no mass. There is tenderness in the left upper quadrant and left lower quadrant. There is no rebound and no guarding. No hernia.  Musculoskeletal: She exhibits no edema or tenderness.  Neurological: She is alert. She has normal strength. No cranial nerve deficit (no facial droop, extraocular movements intact, no slurred speech) or sensory deficit. She exhibits normal muscle tone. She displays no seizure activity. Coordination normal.  Skin: Skin is warm and dry. No rash noted.  Psychiatric: She has a normal mood and affect.  Nursing note and vitals reviewed.    ED Treatments / Results  Labs (all  labs ordered are listed, but only abnormal results are displayed) Labs Reviewed  COMPREHENSIVE METABOLIC PANEL - Abnormal; Notable for the following components:      Result Value   Potassium 3.3 (*)    Glucose, Bld 116 (*)    All other components within normal limits  CBC WITH DIFFERENTIAL/PLATELET - Abnormal; Notable for the following components:   Monocytes Absolute 1.1 (*)    All other components within normal limits  URINALYSIS, ROUTINE W REFLEX MICROSCOPIC - Abnormal; Notable for the following components:   Color, Urine COLORLESS (*)    Specific Gravity, Urine 1.003 (*)    All other  components within normal limits  LIPASE, BLOOD     Radiology Ct Abdomen Pelvis W Contrast  Result Date: 01/02/2018 CLINICAL DATA:  Left upper quadrant abdominal pain with nausea for 4 days bloody bowel movements EXAM: CT ABDOMEN AND PELVIS WITH CONTRAST TECHNIQUE: Multidetector CT imaging of the abdomen and pelvis was performed using the standard protocol following bolus administration of intravenous contrast. CONTRAST:  188mL ISOVUE-300 IOPAMIDOL (ISOVUE-300) INJECTION 61% COMPARISON:  CT 12/25/2016, 06/29/2016, 02/19/2007 FINDINGS: Lower chest: Lung bases demonstrate linear scarring in the left base. No acute consolidation or effusion. Borderline to mild cardiomegaly. Small esophageal hiatal hernia Hepatobiliary: Hepatic steatosis. Surgical clips at the gallbladder fossa. No biliary dilatation Pancreas: Unremarkable. No pancreatic ductal dilatation or surrounding inflammatory changes. Spleen: Normal in size without focal abnormality. Adrenals/Urinary Tract: Adrenal glands are within normal limits. No hydronephrosis. Cysts within the bilateral kidneys. The bladder is normal. Stomach/Bowel: Stomach is within normal limits. Appendix appears normal. No evidence of bowel wall thickening, distention, or inflammatory changes. Scattered diverticular disease without acute inflammation. Vascular/Lymphatic: Mild aortic  atherosclerosis. No aneurysmal dilatation. Stable small lymph node adjacent to the pancreas. No increasing adenopathy. Reproductive: Status post hysterectomy. No adnexal masses. Other: Negative for free air or free fluid. Musculoskeletal: Degenerative changes of the spine. No acute or suspicious bone lesions. Stable sclerotic focus in the right femoral head. IMPRESSION: 1. No CT evidence for acute intra-abdominal or pelvic abnormality 2. Hepatic steatosis 3. Stable cysts within both kidneys Electronically Signed   By: Donavan Foil M.D.   On: 01/02/2018 19:07    Procedures Procedures (including critical care time)  Medications Ordered in ED Medications  sodium chloride 0.9 % bolus 1,000 mL (1,000 mLs Intravenous New Bag/Given 01/02/18 1637)    And  0.9 %  sodium chloride infusion (not administered)  HYDROmorphone (DILAUDID) injection 0.5 mg (0.5 mg Intravenous Given 01/02/18 1635)  ondansetron (ZOFRAN) injection 4 mg (4 mg Intravenous Given 01/02/18 1635)  iopamidol (ISOVUE-300) 61 % injection 100 mL (100 mLs Intravenous Contrast Given 01/02/18 1847)     Initial Impression / Assessment and Plan / ED Course  I have reviewed the triage vital signs and the nursing notes.  Pertinent labs & imaging results that were available during my care of the patient were reviewed by me and considered in my medical decision making (see chart for details).   Patient presented to the emergency room for evaluation of abdominal pain.  Patient was empirically treated for diverticulitis but her symptoms were not improving despite oral antibiotics.  Laboratory tests are reassuring today.  CT scan does not actually show any evidence of diverticulitis or any other acute finding.   Plan on discharge home with antacids and medications for pain.  Follow-up with her primary care doctor.  Final Clinical Impressions(s) / ED Diagnoses   Final diagnoses:  Left upper quadrant pain    ED Discharge Orders        Ordered     traMADol (ULTRAM) 50 MG tablet  Every 6 hours PRN     01/02/18 1930    omeprazole (PRILOSEC) 20 MG capsule  Daily     01/02/18 1930       Dorie Rank, MD 01/02/18 1932

## 2018-01-02 NOTE — ED Triage Notes (Signed)
PT states LUQ abdominal pain with nausea x4 days and states her PCP started her on PO antibiotics on Thursday with little to no relief from pain. PT denies any noted blood in her bowel movements and last BM was today 01/02/18.

## 2018-01-02 NOTE — Discharge Instructions (Signed)
Take the medications as prescribed, follow-up with your primary care doctor this week if the symptoms do not resolve

## 2018-01-04 MED FILL — PREMARIN 0.625 MG TABLET: 0.625 | 90 days supply | Qty: 90 | Fill #3

## 2018-01-07 MED FILL — METOPROLOL SUCC ER 50 MG TA: 50 | 30 days supply | Qty: 30 | Fill #1

## 2018-01-07 MED FILL — ALPRAZolam 0.5 MG TABS: 0.5 | 30 days supply | Qty: 60 | Fill #1

## 2018-01-12 MED FILL — ACCU-CHEK GUIDE TEST STRIP: 25 days supply | Qty: 50 | Fill #6

## 2018-01-13 ENCOUNTER — Ambulatory Visit (INDEPENDENT_AMBULATORY_CARE_PROVIDER_SITE_OTHER): Payer: 59 | Admitting: Internal Medicine

## 2018-01-13 ENCOUNTER — Encounter (INDEPENDENT_AMBULATORY_CARE_PROVIDER_SITE_OTHER): Payer: Self-pay | Admitting: Internal Medicine

## 2018-01-13 VITALS — BP 160/88 | HR 84 | Temp 98.0°F | Ht 63.0 in | Wt 244.6 lb

## 2018-01-13 DIAGNOSIS — R1012 Left upper quadrant pain: Secondary | ICD-10-CM | POA: Diagnosis not present

## 2018-01-13 DIAGNOSIS — K5790 Diverticulosis of intestine, part unspecified, without perforation or abscess without bleeding: Secondary | ICD-10-CM

## 2018-01-13 DIAGNOSIS — K5730 Diverticulosis of large intestine without perforation or abscess without bleeding: Secondary | ICD-10-CM

## 2018-01-13 HISTORY — DX: Diverticulosis of intestine, part unspecified, without perforation or abscess without bleeding: K57.90

## 2018-01-13 NOTE — Patient Instructions (Addendum)
She will call if symptoms return. OV 6 months.  Diverticulosis Diverticulosis is a condition that develops when small pouches (diverticula) form in the wall of the large intestine (colon). The colon is where water is absorbed and stool is formed. The pouches form when the inside layer of the colon pushes through weak spots in the outer layers of the colon. You may have a few pouches or many of them. What are the causes? The cause of this condition is not known. What increases the risk? The following factors may make you more likely to develop this condition:  Being older than age 40. Your risk for this condition increases with age. Diverticulosis is rare among people younger than age 43. By age 37, many people have it.  Eating a low-fiber diet.  Having frequent constipation.  Being overweight.  Not getting enough exercise.  Smoking.  Taking over-the-counter pain medicines, like aspirin and ibuprofen.  Having a family history of diverticulosis.  What are the signs or symptoms? In most people, there are no symptoms of this condition. If you do have symptoms, they may include:  Bloating.  Cramps in the abdomen.  Constipation or diarrhea.  Pain in the lower left side of the abdomen.  How is this diagnosed? This condition is most often diagnosed during an exam for other colon problems. Because diverticulosis usually has no symptoms, it often cannot be diagnosed independently. This condition may be diagnosed by:  Using a flexible scope to examine the colon (colonoscopy).  Taking an X-ray of the colon after dye has been put into the colon (barium enema).  Doing a CT scan.  How is this treated? You may not need treatment for this condition if you have never developed an infection related to diverticulosis. If you have had an infection before, treatment may include:  Eating a high-fiber diet. This may include eating more fruits, vegetables, and grains.  Taking a fiber  supplement.  Taking a live bacteria supplement (probiotic).  Taking medicine to relax your colon.  Taking antibiotic medicines.  Follow these instructions at home:  Drink 6-8 glasses of water or more each day to prevent constipation.  Try not to strain when you have a bowel movement.  If you have had an infection before: ? Eat more fiber as directed by your health care provider or your diet and nutrition specialist (dietitian). ? Take a fiber supplement or probiotic, if your health care provider approves.  Take over-the-counter and prescription medicines only as told by your health care provider.  If you were prescribed an antibiotic, take it as told by your health care provider. Do not stop taking the antibiotic even if you start to feel better.  Keep all follow-up visits as told by your health care provider. This is important. Contact a health care provider if:  You have pain in your abdomen.  You have bloating.  You have cramps.  You have not had a bowel movement in 3 days. Get help right away if:  Your pain gets worse.  Your bloating becomes very bad.  You have a fever or chills, and your symptoms suddenly get worse.  You vomit.  You have bowel movements that are bloody or black.  You have bleeding from your rectum. Summary  Diverticulosis is a condition that develops when small pouches (diverticula) form in the wall of the large intestine (colon).  You may have a few pouches or many of them.  This condition is most often diagnosed during an exam  for other colon problems.  If you have had an infection related to diverticulosis, treatment may include increasing the fiber in your diet, taking supplements, or taking medicines. This information is not intended to replace advice given to you by your health care provider. Make sure you discuss any questions you have with your health care provider. Document Released: 08/27/2004 Document Revised: 10/19/2016 Document  Reviewed: 10/19/2016 Elsevier Interactive Patient Education  2017 [employer]  Diverticulitis Diverticulitis is infection or inflammation of small pouches (diverticula) in the colon that form due to a condition called diverticulosis. Diverticula can trap stool (feces) and bacteria, causing infection and inflammation. Diverticulitis may cause severe stomach pain and diarrhea. It may lead to tissue damage in the colon that causes bleeding. The diverticula may also burst (rupture) and cause infected stool to enter other areas of the abdomen. Complications of diverticulitis can include:  Bleeding.  Severe infection.  Severe pain.  Rupture (perforation) of the colon.  Blockage (obstruction) of the colon.  What are the causes? This condition is caused by stool becoming trapped in the diverticula, which allows bacteria to grow in the diverticula. This leads to inflammation and infection. What increases the risk? You are more likely to develop this condition if:  You have diverticulosis. The risk for diverticulosis increases if: ? You are overweight or obese. ? You use tobacco products. ? You do not get enough exercise.  You eat a diet that does not include enough fiber. High-fiber foods include fruits, vegetables, beans, nuts, and whole grains.  What are the signs or symptoms? Symptoms of this condition may include:  Pain and tenderness in the abdomen. The pain is normally located on the left side of the abdomen, but it may occur in other areas.  Fever and chills.  Bloating.  Cramping.  Nausea.  Vomiting.  Changes in bowel routines.  Blood in your stool.  How is this diagnosed? This condition is diagnosed based on:  Your medical history.  A physical exam.  Tests to make sure there is nothing else causing your condition. These tests may include: ? Blood tests. ? Urine tests. ? Imaging tests of the abdomen, including X-rays, ultrasounds, MRIs, or CT scans.  How  is this treated? Most cases of this condition are mild and can be treated at home. Treatment may include:  Taking over-the-counter pain medicines.  Following a clear liquid diet.  Taking antibiotic medicines by mouth.  Rest.  More severe cases may need to be treated at a hospital. Treatment may include:  Not eating or drinking.  Taking prescription pain medicine.  Receiving antibiotic medicines through an IV tube.  Receiving fluids and nutrition through an IV tube.  Surgery.  When your condition is under control, your health care provider may recommend that you have a colonoscopy. This is an exam to look at the entire large intestine. During the exam, a lubricated, bendable tube is inserted into the anus and then passed into the rectum, colon, and other parts of the large intestine. A colonoscopy can show how severe your diverticula are and whether something else may be causing your symptoms. Follow these instructions at home: Medicines  Take over-the-counter and prescription medicines only as told by your health care provider. These include fiber supplements, probiotics, and stool softeners.  If you were prescribed an antibiotic medicine, take it as told by your health care provider. Do not stop taking the antibiotic even if you start to feel better.  Do not drive or use heavy  machinery while taking prescription pain medicine. General instructions  Follow a full liquid diet or another diet as directed by your health care provider. After your symptoms improve, your health care provider may tell you to change your diet. He or she may recommend that you eat a diet that contains at least 25 g (25 grams) of fiber daily. Fiber makes it easier to pass stool. Healthy sources of fiber include: ? Berries. One cup contains 4-8 grams of fiber. ? Beans or lentils. One half cup contains 5-8 grams of fiber. ? Green vegetables. One cup contains 4 grams of fiber.  Exercise for at least 30  minutes, 3 times each week. You should exercise hard enough to raise your heart rate and break a sweat.  Keep all follow-up visits as told by your health care provider. This is important. You may need a colonoscopy. Contact a health care provider if:  Your pain does not improve.  You have a hard time drinking or eating food.  Your bowel movements do not return to normal. Get help right away if:  Your pain gets worse.  Your symptoms do not get better with treatment.  Your symptoms suddenly get worse.  You have a fever.  You vomit more than one time.  You have stools that are bloody, black, or tarry. Summary  Diverticulitis is infection or inflammation of small pouches (diverticula) in the colon that form due to a condition called diverticulosis. Diverticula can trap stool (feces) and bacteria, causing infection and inflammation.  You are at higher risk for this condition if you have diverticulosis and you eat a diet that does not include enough fiber.  Most cases of this condition are mild and can be treated at home. More severe cases may need to be treated at a hospital.  When your condition is under control, your health care provider may recommend that you have an exam called a colonoscopy. This exam can show how severe your diverticula are and whether something else may be causing your symptoms. This information is not intended to replace advice given to you by your health care provider. Make sure you discuss any questions you have with your health care provider. Document Released: 09/09/2005 Document Revised: 01/02/2017 Document Reviewed: 01/02/2017 Elsevier Interactive Patient Education  [employer].

## 2018-01-13 NOTE — Progress Notes (Addendum)
Subjective:    Patient ID: [name], female    DOB: [birth year], 59 y.o.   MRN: [id number]  HPI Referred by Dr. Gerarda Fraction for diverticulitis. Recently seen in the ED at [facility] with LUQ pain 01/02/2018. CT abdomen/pelvis with CM  (LUQ Pain and bloody BMs) revealed  IMPRESSION: 1. No CT evidence for acute intra-abdominal or pelvic abnormality 2. Hepatic steatosis 3. Stable cysts within both kidneys Her CBC was normal on 01/02/2018 She had started having abdominal pain earlier in the week. Some nausea and diarrhea.  She was given an Rx for Cipro x 7 days and Flagyl x 10  days given.  Admitted to hospital 12/25/2016 with intractable nausea and vomiting from viral gastroenteritis.  She tells me since her ED visit she has been doing good. She has not had any pain x 10 days. BMs are normal. She denies seeing any blood. She is not having any GI symptoms. Her appetite is good.  Her last colonoscopy 7 yrs ago by Dr. Yaakov Guthrie (Screening) Scattered diverticula were found, few, primary rt sided.    No urinary symptoms.   Hx of diabetes  CMP Latest Ref Rng & Units 01/02/2018 12/26/2016 12/25/2016  Glucose 65 - 99 mg/dL 116(H) 144(H) 163(H)  BUN 6 - 20 mg/dL 7 10 13   Creatinine 0.44 - 1.00 mg/dL 0.56 0.51 0.52  Sodium 135 - 145 mmol/L 139 136 138  Potassium 3.5 - 5.1 mmol/L 3.3(L) 3.2(L) 3.2(L)  Chloride 101 - 111 mmol/L 102 103 101  CO2 22 - 32 mmol/L 24 25 29   Calcium 8.9 - 10.3 mg/dL 9.2 7.5(L) 9.1  Total Protein 6.5 - 8.1 g/dL 7.9 6.4(L) 7.8  Total Bilirubin 0.3 - 1.2 mg/dL 0.5 0.3 0.3  Alkaline Phos 38 - 126 U/L 53 39 46  AST 15 - 41 U/L 26 22 27   ALT 14 - 54 U/L 19 23 26      01/02/2017 Lipase 23,  Urinalysis    Component Value Date/Time   COLORURINE COLORLESS (A) 01/02/2018 1601   APPEARANCEUR CLEAR 01/02/2018 1601   LABSPEC 1.003 (L) 01/02/2018 1601   PHURINE 8.0 01/02/2018 1601   GLUCOSEU NEGATIVE 01/02/2018 1601   HGBUR NEGATIVE 01/02/2018 1601   BILIRUBINUR NEGATIVE 01/02/2018  1601   KETONESUR NEGATIVE 01/02/2018 1601   PROTEINUR NEGATIVE 01/02/2018 1601   UROBILINOGEN 0.2 08/26/2014 0810   NITRITE NEGATIVE 01/02/2018 1601   LEUKOCYTESUR NEGATIVE 01/02/2018 1601    CBC    Component Value Date/Time   WBC 9.9 01/02/2018 1558   RBC 4.42 01/02/2018 1558   HGB 13.0 01/02/2018 1558   HCT 39.4 01/02/2018 1558   PLT 293 01/02/2018 1558   MCV 89.1 01/02/2018 1558   MCH 29.4 01/02/2018 1558   MCHC 33.0 01/02/2018 1558   RDW 13.1 01/02/2018 1558   LYMPHSABS 3.6 01/02/2018 1558   MONOABS 1.1 (H) 01/02/2018 1558   EOSABS 0.5 01/02/2018 1558   BASOSABS 0.0 01/02/2018 1558     Review of Systems Past Medical History:  Diagnosis Date  . Anxiety   . Asthma    diagnosed as an adult  . Diabetes mellitus   . Hypertension     Past Surgical History:  Procedure Laterality Date  . ABDOMINAL HYSTERECTOMY    . CARPAL TUNNEL RELEASE    . CESAREAN SECTION    . CHOLECYSTECTOMY    . SINUS EXPLORATION    . TRIGGER FINGER RELEASE    . WRIST SURGERY      Allergies  Allergen Reactions  .  Barium-Containing Compounds Itching and Other (See Comments)    Weakness, loss of voice  . Statins Other (See Comments)    Myalgia, muscle weakness  . Sulfa Antibiotics Itching and Other (See Comments)    Weakness, loss of voice  . Victoza [Liraglutide] Diarrhea and Nausea Only    Current Outpatient Medications on File Prior to Visit  Medication Sig Dispense Refill  . acetaminophen (TYLENOL) 500 MG tablet Take 1,000 mg by mouth at bedtime as needed. For pain    . ALPRAZolam (XANAX) 0.5 MG tablet Take 0.5 mg by mouth 2 (two) times daily as needed for anxiety.     Marland Kitchen amLODipine (NORVASC) 10 MG tablet Take 10 mg by mouth daily.    Marland Kitchen aspirin EC 81 MG tablet Take 81 mg by mouth daily.    Marland Kitchen estrogens, conjugated, (PREMARIN) 0.625 MG tablet Take 0.625 mg by mouth daily.     Marland Kitchen levocetirizine (XYZAL) 5 MG tablet Take 5 mg by mouth daily.    Marland Kitchen losartan (COZAAR) 25 MG tablet Take 25 mg  by mouth daily.    . metFORMIN (GLUCOPHAGE) 500 MG tablet Take 500 mg by mouth 2 (two) times daily.  3  . metoprolol succinate (TOPROL-XL) 50 MG 24 hr tablet Take 1 tablet by mouth daily.  11  . montelukast (SINGULAIR) 10 MG tablet Take 10 mg by mouth at bedtime.    Marland Kitchen omeprazole (PRILOSEC) 20 MG capsule Take 1 capsule (20 mg total) by mouth daily. 14 capsule 0  . pantoprazole (PROTONIX) 40 MG tablet Take 40 mg by mouth daily.    . sitaGLIPtin (JANUVIA) 100 MG tablet Take 100 mg by mouth daily.    . traMADol (ULTRAM) 50 MG tablet Take 1 tablet (50 mg total) by mouth every 6 (six) hours as needed. 15 tablet 0   No current facility-administered medications on file prior to visit.         Objective:   Physical Exam Blood pressure (!) 160/88, pulse 84, temperature 98 F (36.7 C), height 5\' 3"  (1.6 m), weight 244 lb 9.6 oz (110.9 kg). Alert and oriented. Skin warm and dry. Oral mucosa is moist.   . Sclera anicteric, conjunctivae is pink. Thyroid not enlarged. No cervical lymphadenopathy. Lungs clear. Heart regular rate and rhythm.  Abdomen is soft. Bowel sounds are positive. No hepatomegaly. No abdominal masses felt. No tenderness.  No edema to lower extremities. Patient is alert and oriented.        Assessment & Plan:  Left mid abdominal pain which has now resolved. Will get last colonoscopy report.  Further recommendations to follow.  She will call if pain returns.

## 2018-01-17 MED FILL — JANUVIA 100 MG TABLET: 100 | 90 days supply | Qty: 90 | Fill #3

## 2018-01-17 MED FILL — AMLODIPINE BESYLATE 10 MG T: 10 | 90 days supply | Qty: 90 | Fill #1

## 2018-02-08 MED FILL — ALPRAZolam 0.5 MG TABS: 0.5 | 30 days supply | Qty: 60 | Fill #2

## 2018-02-08 MED FILL — PANTOPRAZOLE SOD DR 40 MG T: 40 | 90 days supply | Qty: 90 | Fill #1

## 2018-02-09 MED FILL — MONTELUKAST SOD 10 MG TAB: 10 | 90 days supply | Qty: 90 | Fill #1

## 2018-02-09 MED FILL — LOSARTAN POTASSIUM 25 MG TA: 25 | 90 days supply | Qty: 90 | Fill #1

## 2018-02-23 DIAGNOSIS — Z1389 Encounter for screening for other disorder: Secondary | ICD-10-CM | POA: Diagnosis not present

## 2018-02-23 DIAGNOSIS — R51 Headache: Secondary | ICD-10-CM | POA: Diagnosis not present

## 2018-02-23 DIAGNOSIS — J302 Other seasonal allergic rhinitis: Secondary | ICD-10-CM | POA: Diagnosis not present

## 2018-02-23 DIAGNOSIS — Z6841 Body Mass Index (BMI) 40.0 and over, adult: Secondary | ICD-10-CM | POA: Diagnosis not present

## 2018-02-23 DIAGNOSIS — H6991 Unspecified Eustachian tube disorder, right ear: Secondary | ICD-10-CM | POA: Diagnosis not present

## 2018-02-23 DIAGNOSIS — H1013 Acute atopic conjunctivitis, bilateral: Secondary | ICD-10-CM | POA: Diagnosis not present

## 2018-02-23 DIAGNOSIS — I10 Essential (primary) hypertension: Secondary | ICD-10-CM | POA: Diagnosis not present

## 2018-02-24 MED FILL — LEVOCETIRIZINE 5 MG TABLET: 5 | 90 days supply | Qty: 90 | Fill #0

## 2018-02-24 MED FILL — CROMOLYN 4% EYE DROPS: 4 | 13 days supply | Qty: 10 | Fill #0

## 2018-02-24 MED FILL — predniSONE 5 MG TABS: 5 | 6 days supply | Qty: 21 | Fill #0

## 2018-02-24 MED FILL — FLUTICASONE PROP 50 MCG SPR: 50 | 30 days supply | Qty: 16 | Fill #0

## 2018-02-24 MED FILL — METOPROLOL SUCCINATE ER 100: 100 | 90 days supply | Qty: 90 | Fill #0

## 2018-03-02 MED FILL — ACCU-CHEK FASTCLIX LANCETS: 50 days supply | Qty: 102 | Fill #3

## 2018-03-02 MED FILL — ACCU-CHEK GUIDE TEST STRIP: 25 days supply | Qty: 50 | Fill #7

## 2018-03-07 DIAGNOSIS — L659 Nonscarring hair loss, unspecified: Secondary | ICD-10-CM | POA: Diagnosis not present

## 2018-03-07 DIAGNOSIS — L739 Follicular disorder, unspecified: Secondary | ICD-10-CM | POA: Diagnosis not present

## 2018-03-07 MED FILL — CLOBETASOL PROP 0.05% FOAM: 0.05 | 30 days supply | Qty: 100 | Fill #0

## 2018-03-17 MED FILL — PREMARIN 0.625 MG TABLET: 0.625 | 30 days supply | Qty: 30 | Fill #0

## 2018-03-31 ENCOUNTER — Other Ambulatory Visit: Payer: Self-pay | Admitting: Internal Medicine

## 2018-03-31 DIAGNOSIS — Z1231 Encounter for screening mammogram for malignant neoplasm of breast: Secondary | ICD-10-CM

## 2018-04-01 DIAGNOSIS — Z01419 Encounter for gynecological examination (general) (routine) without abnormal findings: Secondary | ICD-10-CM | POA: Diagnosis not present

## 2018-04-01 DIAGNOSIS — Z6841 Body Mass Index (BMI) 40.0 and over, adult: Secondary | ICD-10-CM | POA: Diagnosis not present

## 2018-04-01 DIAGNOSIS — N951 Menopausal and female climacteric states: Secondary | ICD-10-CM | POA: Diagnosis not present

## 2018-04-14 DIAGNOSIS — E1165 Type 2 diabetes mellitus with hyperglycemia: Secondary | ICD-10-CM | POA: Diagnosis not present

## 2018-04-14 DIAGNOSIS — Z1389 Encounter for screening for other disorder: Secondary | ICD-10-CM | POA: Diagnosis not present

## 2018-04-14 DIAGNOSIS — E782 Mixed hyperlipidemia: Secondary | ICD-10-CM | POA: Diagnosis not present

## 2018-04-14 DIAGNOSIS — E669 Obesity, unspecified: Secondary | ICD-10-CM | POA: Diagnosis not present

## 2018-04-14 DIAGNOSIS — Z6841 Body Mass Index (BMI) 40.0 and over, adult: Secondary | ICD-10-CM | POA: Diagnosis not present

## 2018-04-14 DIAGNOSIS — I10 Essential (primary) hypertension: Secondary | ICD-10-CM | POA: Diagnosis not present

## 2018-04-14 MED FILL — glipiZIDE 5 MG TABS: 5 | 90 days supply | Qty: 90 | Fill #0

## 2018-04-14 MED FILL — JANUVIA 100 MG TABLET: 100 | 90 days supply | Qty: 90 | Fill #0

## 2018-04-15 MED FILL — CLOBETASOL PROP 0.05% FOAM: 0.05 | 30 days supply | Qty: 100 | Fill #1

## 2018-04-21 ENCOUNTER — Ambulatory Visit: Payer: 59

## 2018-04-21 MED FILL — AMLODIPINE BESYLATE 10 MG T: 10 | 90 days supply | Qty: 90 | Fill #2

## 2018-04-21 MED FILL — PREMARIN 0.625 MG TABLET: 0.625 | 30 days supply | Qty: 30 | Fill #0

## 2018-04-26 DIAGNOSIS — L659 Nonscarring hair loss, unspecified: Secondary | ICD-10-CM | POA: Diagnosis not present

## 2018-04-26 DIAGNOSIS — L739 Follicular disorder, unspecified: Secondary | ICD-10-CM | POA: Diagnosis not present

## 2018-04-26 MED FILL — ACCU-CHEK GUIDE TEST STRIP: 25 days supply | Qty: 50 | Fill #8

## 2018-04-28 ENCOUNTER — Ambulatory Visit: Payer: 59

## 2018-05-04 MED FILL — METFORMIN HCL ER 500 MG TAB: 500 | 90 days supply | Qty: 180 | Fill #2

## 2018-05-08 ENCOUNTER — Encounter (HOSPITAL_COMMUNITY): Payer: Self-pay | Admitting: Emergency Medicine

## 2018-05-08 ENCOUNTER — Other Ambulatory Visit: Payer: Self-pay

## 2018-05-08 ENCOUNTER — Emergency Department (HOSPITAL_COMMUNITY)
Admission: EM | Admit: 2018-05-08 | Discharge: 2018-05-08 | Disposition: A | Discharge status: Home / Self Care | Payer: 59 | Attending: Emergency Medicine | Admitting: Emergency Medicine

## 2018-05-08 DIAGNOSIS — Z79899 Other long term (current) drug therapy: Secondary | ICD-10-CM | POA: Diagnosis not present

## 2018-05-08 DIAGNOSIS — E119 Type 2 diabetes mellitus without complications: Secondary | ICD-10-CM | POA: Insufficient documentation

## 2018-05-08 DIAGNOSIS — R1032 Left lower quadrant pain: Secondary | ICD-10-CM | POA: Insufficient documentation

## 2018-05-08 DIAGNOSIS — R112 Nausea with vomiting, unspecified: Secondary | ICD-10-CM | POA: Insufficient documentation

## 2018-05-08 DIAGNOSIS — Z87891 Personal history of nicotine dependence: Secondary | ICD-10-CM | POA: Insufficient documentation

## 2018-05-08 DIAGNOSIS — R197 Diarrhea, unspecified: Secondary | ICD-10-CM | POA: Diagnosis not present

## 2018-05-08 DIAGNOSIS — Z7982 Long term (current) use of aspirin: Secondary | ICD-10-CM | POA: Insufficient documentation

## 2018-05-08 DIAGNOSIS — R109 Unspecified abdominal pain: Secondary | ICD-10-CM

## 2018-05-08 DIAGNOSIS — Z7984 Long term (current) use of oral hypoglycemic drugs: Secondary | ICD-10-CM | POA: Diagnosis not present

## 2018-05-08 DIAGNOSIS — I10 Essential (primary) hypertension: Secondary | ICD-10-CM | POA: Insufficient documentation

## 2018-05-08 DIAGNOSIS — J45909 Unspecified asthma, uncomplicated: Secondary | ICD-10-CM | POA: Diagnosis not present

## 2018-05-08 DIAGNOSIS — R000 Tachycardia, unspecified: Secondary | ICD-10-CM | POA: Diagnosis not present

## 2018-05-08 LAB — URINALYSIS, ROUTINE W REFLEX MICROSCOPIC
BILIRUBIN URINE: NEGATIVE
Glucose, UA: NEGATIVE mg/dL
Ketones, ur: NEGATIVE mg/dL
Leukocytes, UA: NEGATIVE
NITRITE: NEGATIVE
PROTEIN: NEGATIVE mg/dL
Specific Gravity, Urine: 1.013 (ref 1.005–1.030)
pH: 5 (ref 5.0–8.0)

## 2018-05-08 LAB — COMPREHENSIVE METABOLIC PANEL
ALK PHOS: 54 U/L (ref 38–126)
ALT: 19 U/L (ref 14–54)
ANION GAP: 10 (ref 5–15)
AST: 21 U/L (ref 15–41)
Albumin: 3.8 g/dL (ref 3.5–5.0)
BILIRUBIN TOTAL: 0.5 mg/dL (ref 0.3–1.2)
BUN: 13 mg/dL (ref 6–20)
CALCIUM: 9.1 mg/dL (ref 8.9–10.3)
CO2: 28 mmol/L (ref 22–32)
CREATININE: 0.62 mg/dL (ref 0.44–1.00)
Chloride: 99 mmol/L — ABNORMAL LOW (ref 101–111)
Glucose, Bld: 152 mg/dL — ABNORMAL HIGH (ref 65–99)
Potassium: 3.5 mmol/L (ref 3.5–5.1)
SODIUM: 137 mmol/L (ref 135–145)
TOTAL PROTEIN: 8.1 g/dL (ref 6.5–8.1)

## 2018-05-08 LAB — CBC
HCT: 42.7 % (ref 36.0–46.0)
HEMOGLOBIN: 14.1 g/dL (ref 12.0–15.0)
MCH: 29.4 pg (ref 26.0–34.0)
MCHC: 33 g/dL (ref 30.0–36.0)
MCV: 89.1 fL (ref 78.0–100.0)
Platelets: 285 10*3/uL (ref 150–400)
RBC: 4.79 MIL/uL (ref 3.87–5.11)
RDW: 13.7 % (ref 11.5–15.5)
WBC: 16.4 10*3/uL — AB (ref 4.0–10.5)

## 2018-05-08 LAB — LIPASE, BLOOD: Lipase: 25 U/L (ref 11–51)

## 2018-05-08 LAB — CBG MONITORING, ED: Glucose-Capillary: 106 mg/dL — ABNORMAL HIGH (ref 65–99)

## 2018-05-08 MED ORDER — ONDANSETRON 4 MG PO TBDP: 4.0000 mg | ORAL_TABLET | Freq: Three times a day (TID) | ORAL | 0 refills | Status: DC | PRN

## 2018-05-08 MED ORDER — FENTANYL CITRATE (PF) 100 MCG/2ML IJ SOLN
50.0000 ug | Freq: Once | INTRAMUSCULAR | Status: AC
  Administered 2018-05-08: 50 ug via INTRAVENOUS
  Filled 2018-05-08: qty 2

## 2018-05-08 MED ORDER — METOCLOPRAMIDE HCL 10 MG PO TABS: 10.0000 mg | ORAL_TABLET | Freq: Three times a day (TID) | ORAL | 0 refills | Status: DC | PRN

## 2018-05-08 MED ORDER — SODIUM CHLORIDE 0.9 % IV BOLUS
1000.0000 mL | Freq: Once | INTRAVENOUS | Status: AC
  Administered 2018-05-08: 1000 mL via INTRAVENOUS

## 2018-05-08 MED ORDER — ONDANSETRON HCL 4 MG/2ML IJ SOLN
4.0000 mg | Freq: Once | INTRAMUSCULAR | Status: AC
  Administered 2018-05-08: 4 mg via INTRAVENOUS
  Filled 2018-05-08: qty 2

## 2018-05-08 MED ORDER — METOCLOPRAMIDE HCL 5 MG/ML IJ SOLN
10.0000 mg | Freq: Once | INTRAMUSCULAR | Status: AC
  Administered 2018-05-08: 10 mg via INTRAVENOUS
  Filled 2018-05-08: qty 2

## 2018-05-08 NOTE — ED Notes (Signed)
Pt tolerating PO fluids well. No complaints of vomiting.

## 2018-05-08 NOTE — ED Provider Notes (Signed)
[facility] EMERGENCY DEPARTMENT Provider Note   CSN: [id number] Arrival date & time: 05/08/18  1145     History   Chief Complaint Chief Complaint  Patient presents with  . Abdominal Pain  . Emesis  . Diarrhea    HPI [name] is a 57 y.o. female.  HPI Patient presents with nausea vomiting diarrhea.  Began this morning.  Also left-sided abdominal pain.  Pain is cramping.  Decreased appetite.  Has had symptoms like this previously.  No clear sick contacts.  No dysuria.  No relief with Tylenol.  States the pain is severe.  Decreased appetite. Past Medical History:  Diagnosis Date  . Anxiety   . Asthma    diagnosed as an adult  . Diabetes mellitus   . Diverticulosis 01/13/2018  . Hypertension     Patient Active Problem List   Diagnosis Date Noted  . Diverticulosis 01/13/2018  . Intractable nausea and vomiting 12/25/2016  . Diabetes ([place]) 09/02/2016  . Asthma 09/02/2016  . MORBID OBESITY 07/02/2009  . Essential hypertension 07/02/2009  . CHEST DISCOMFORT 07/02/2009    Past Surgical History:  Procedure Laterality Date  . ABDOMINAL HYSTERECTOMY    . CARPAL TUNNEL RELEASE    . CESAREAN SECTION    . CHOLECYSTECTOMY    . SINUS EXPLORATION    . TRIGGER FINGER RELEASE    . WRIST SURGERY       OB History   None      Home Medications    Prior to Admission medications   Medication Sig Start Date End Date Taking? Authorizing Provider  acetaminophen (TYLENOL) 500 MG tablet Take 1,000 mg by mouth at bedtime as needed. For pain   Yes [provider]  albuterol (PROVENTIL HFA;VENTOLIN HFA) 108 (90 Base) MCG/ACT inhaler Inhale 1-2 puffs into the lungs every 6 (six) hours as needed for wheezing or shortness of breath.   Yes [provider]  ALPRAZolam Duanne Moron) 0.5 MG tablet Take 0.5 mg by mouth 2 (two) times daily as needed for anxiety.    Yes [provider]  amLODipine (NORVASC) 10 MG tablet Take 10 mg by mouth daily.   Yes [provider]  aspirin EC 81 MG tablet Take 81 mg by mouth daily.   Yes [provider]  estrogens, conjugated, (PREMARIN) 0.625 MG tablet Take 0.625 mg by mouth daily.    Yes [provider]  levocetirizine (XYZAL) 5 MG tablet Take 5 mg by mouth daily.   Yes [provider]  losartan (COZAAR) 25 MG tablet Take 25 mg by mouth daily.   Yes [provider]  metFORMIN (GLUCOPHAGE) 500 MG tablet Take 500 mg by mouth 2 (two) times daily. 01/14/16  Yes [provider]  metoprolol succinate (TOPROL-XL) 50 MG 24 hr tablet Take 1 tablet by mouth daily. 05/26/16  Yes [provider]  montelukast (SINGULAIR) 10 MG tablet Take 10 mg by mouth at bedtime.   Yes [provider]  omeprazole (PRILOSEC) 20 MG capsule Take 1 capsule (20 mg total) by mouth daily. 01/02/18  Yes Dorie Rank, MD  pantoprazole (PROTONIX) 40 MG tablet Take 40 mg by mouth daily.   Yes [provider]  sitaGLIPtin (JANUVIA) 100 MG tablet Take 100 mg by mouth daily.   Yes [provider]  traMADol (ULTRAM) 50 MG tablet Take 1 tablet (50 mg total) by mouth every 6 (six) hours as needed. 01/02/18  Yes Dorie Rank, MD  metoCLOPramide (REGLAN) 10 MG tablet  Take 1 tablet (10 mg total) by mouth every 8 (eight) hours as needed for nausea. 05/08/18   Davonna Belling, MD  ondansetron (ZOFRAN-ODT) 4 MG disintegrating tablet Take 1 tablet (4 mg total) by mouth every 8 (eight) hours as needed for nausea or vomiting. 05/08/18   Davonna Belling, MD    Family History Family History  Problem Relation Age of Onset  . Hypertension Mother   . Diabetes Mother   . Stroke Mother     Social History Social History   Tobacco Use  . Smoking status: Former Research scientist (life sciences)  . Smokeless tobacco: Never Used  Substance Use Topics  . Alcohol use: No    Alcohol/week: 0.0 oz  . Drug use: No     Allergies   Barium-containing compounds; Statins; Sulfa antibiotics; and Victoza  [liraglutide]   Review of Systems Review of Systems  Constitutional: Positive for appetite change and chills.  Respiratory: Negative for shortness of breath.   Cardiovascular: Negative for chest pain.  Gastrointestinal: Positive for abdominal pain, diarrhea, nausea and vomiting.  Endocrine: Negative for polyuria.  Genitourinary: Negative for flank pain.  Musculoskeletal: Negative for back pain.  Skin: Negative for rash.  Neurological: Negative for seizures and weakness.  Psychiatric/Behavioral: Negative for confusion.     Physical Exam Updated Vital Signs BP 119/61 (BP Location: Left Arm)   Pulse (!) 117   Temp (!) 97.5 F (36.4 C) (Oral)   Resp 17   Ht 5\' 3"  (1.6 m)   Wt 111.6 kg (246 lb)   SpO2 94%   BMI 43.58 kg/m   Physical Exam  Constitutional: She appears well-developed.  Patient appears uncomfortable  HENT:  Head: Normocephalic.  Eyes: Pupils are equal, round, and reactive to light.  Pulmonary/Chest: Breath sounds normal.  Abdominal: Normal appearance.  Left-sided abdominal tenderness without rebound or guarding.  Neurological: She is alert.  Skin: Skin is warm. Capillary refill takes less than 2 seconds.     ED Treatments / Results  Labs (all labs ordered are listed, but only abnormal results are displayed) Labs Reviewed  COMPREHENSIVE METABOLIC PANEL - Abnormal; Notable for the following components:      Result Value   Chloride 99 (*)    Glucose, Bld 152 (*)    All other components within normal limits  CBC - Abnormal; Notable for the following components:   WBC 16.4 (*)    All other components within normal limits  URINALYSIS, ROUTINE W REFLEX MICROSCOPIC - Abnormal; Notable for the following components:   Color, Urine STRAW (*)    Hgb urine dipstick SMALL (*)    Bacteria, UA RARE (*)    All other components within normal limits  CBG MONITORING, ED - Abnormal; Notable for the following components:   Glucose-Capillary 106 (*)    All other  components within normal limits  LIPASE, BLOOD    EKG EKG Interpretation  Date/Time:  Sunday May 08 2018 11:49:48 EDT Ventricular Rate:  103 PR Interval:    QRS Duration: 100 QT Interval:  375 QTC Calculation: 491 R Axis:   86 Text Interpretation:  Sinus tachycardia Right atrial enlargement Borderline prolonged QT interval Baseline wander in lead(s) V6 Confirmed by Davonna Belling [phone]) on 05/08/2018 12:35:41 PM   Radiology No results found.  Procedures Procedures (including critical care time)  Medications Ordered in ED Medications  sodium chloride 0.9 % bolus 1,000 mL (0 mLs Intravenous Stopped 05/08/18 1510)  ondansetron (ZOFRAN) injection 4 mg (4 mg Intravenous Given 05/08/18 1223)  metoCLOPramide (REGLAN) injection 10 mg (10 mg Intravenous Given 05/08/18 1233)  fentaNYL (SUBLIMAZE) injection 50 mcg (50 mcg Intravenous Given 05/08/18 1335)     Initial Impression / Assessment and Plan / ED Course  I have reviewed the triage vital signs and the nursing notes.  Pertinent labs & imaging results that were available during my care of the patient were reviewed by me and considered in my medical decision making (see chart for details).     Patient with abdominal pain.  Nausea vomiting and diarrhea.  Feels better after treatment.  Will discharge home.  Labs reviewed.  Final Clinical Impressions(s) / ED Diagnoses   Final diagnoses:  Nausea vomiting and diarrhea  Abdominal pain, unspecified abdominal location    ED Discharge Orders        Ordered    ondansetron (ZOFRAN-ODT) 4 MG disintegrating tablet  Every 8 hours PRN     05/08/18 1545    metoCLOPramide (REGLAN) 10 MG tablet  Every 8 hours PRN     05/08/18 1545       Davonna Belling, MD 05/08/18 2212

## 2018-05-08 NOTE — ED Triage Notes (Signed)
Pt reports waking up with abd pain, n/v/d this morning and shaking all over.

## 2018-05-10 DIAGNOSIS — R1084 Generalized abdominal pain: Secondary | ICD-10-CM | POA: Diagnosis not present

## 2018-05-10 DIAGNOSIS — Z6841 Body Mass Index (BMI) 40.0 and over, adult: Secondary | ICD-10-CM | POA: Diagnosis not present

## 2018-05-10 DIAGNOSIS — E119 Type 2 diabetes mellitus without complications: Secondary | ICD-10-CM | POA: Diagnosis not present

## 2018-05-10 DIAGNOSIS — R112 Nausea with vomiting, unspecified: Secondary | ICD-10-CM | POA: Diagnosis not present

## 2018-05-10 DIAGNOSIS — Z1389 Encounter for screening for other disorder: Secondary | ICD-10-CM | POA: Diagnosis not present

## 2018-05-10 DIAGNOSIS — R197 Diarrhea, unspecified: Secondary | ICD-10-CM | POA: Diagnosis not present

## 2018-05-10 DIAGNOSIS — R1012 Left upper quadrant pain: Secondary | ICD-10-CM | POA: Diagnosis not present

## 2018-05-10 MED FILL — DIPHENOXYLATE/ATROPINE TAB: 2.5-0.025 | 4 days supply | Qty: 30 | Fill #0

## 2018-05-10 MED FILL — CIPROFLOXACIN HCL 500 MG TA: 500 | 7 days supply | Qty: 14 | Fill #0

## 2018-05-10 MED FILL — PANTOPRAZOLE SOD DR 40 MG T: 40 | 14 days supply | Qty: 14 | Fill #0

## 2018-05-12 IMAGING — DX DG CHEST 2V
2 series · 2 of 2 positions shown · non-contrast
Comparison: 02/02/2016

CLINICAL DATA: Cough and wheezing for several days

EXAM:
CHEST  2 VIEW

[chest pa]
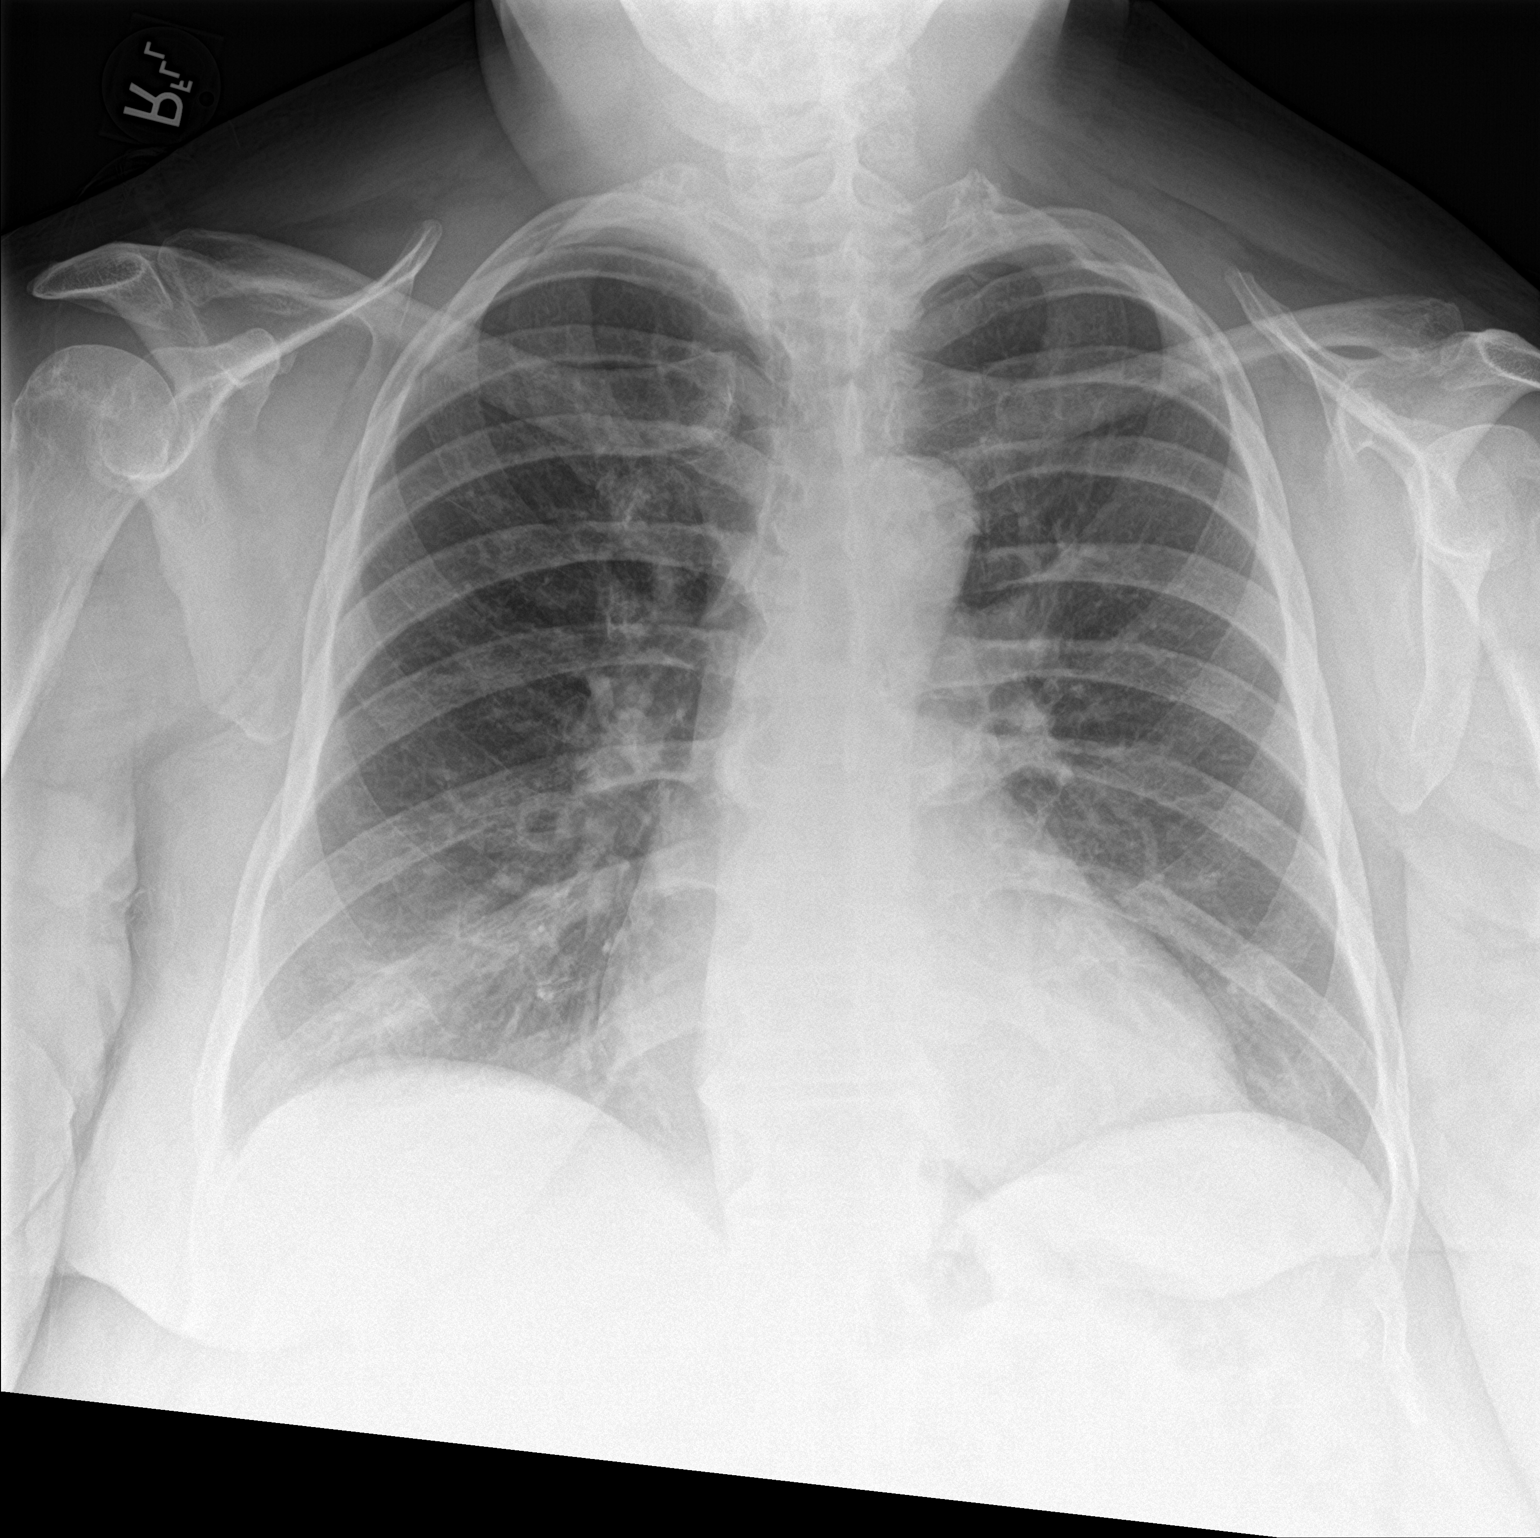

[chest lat]
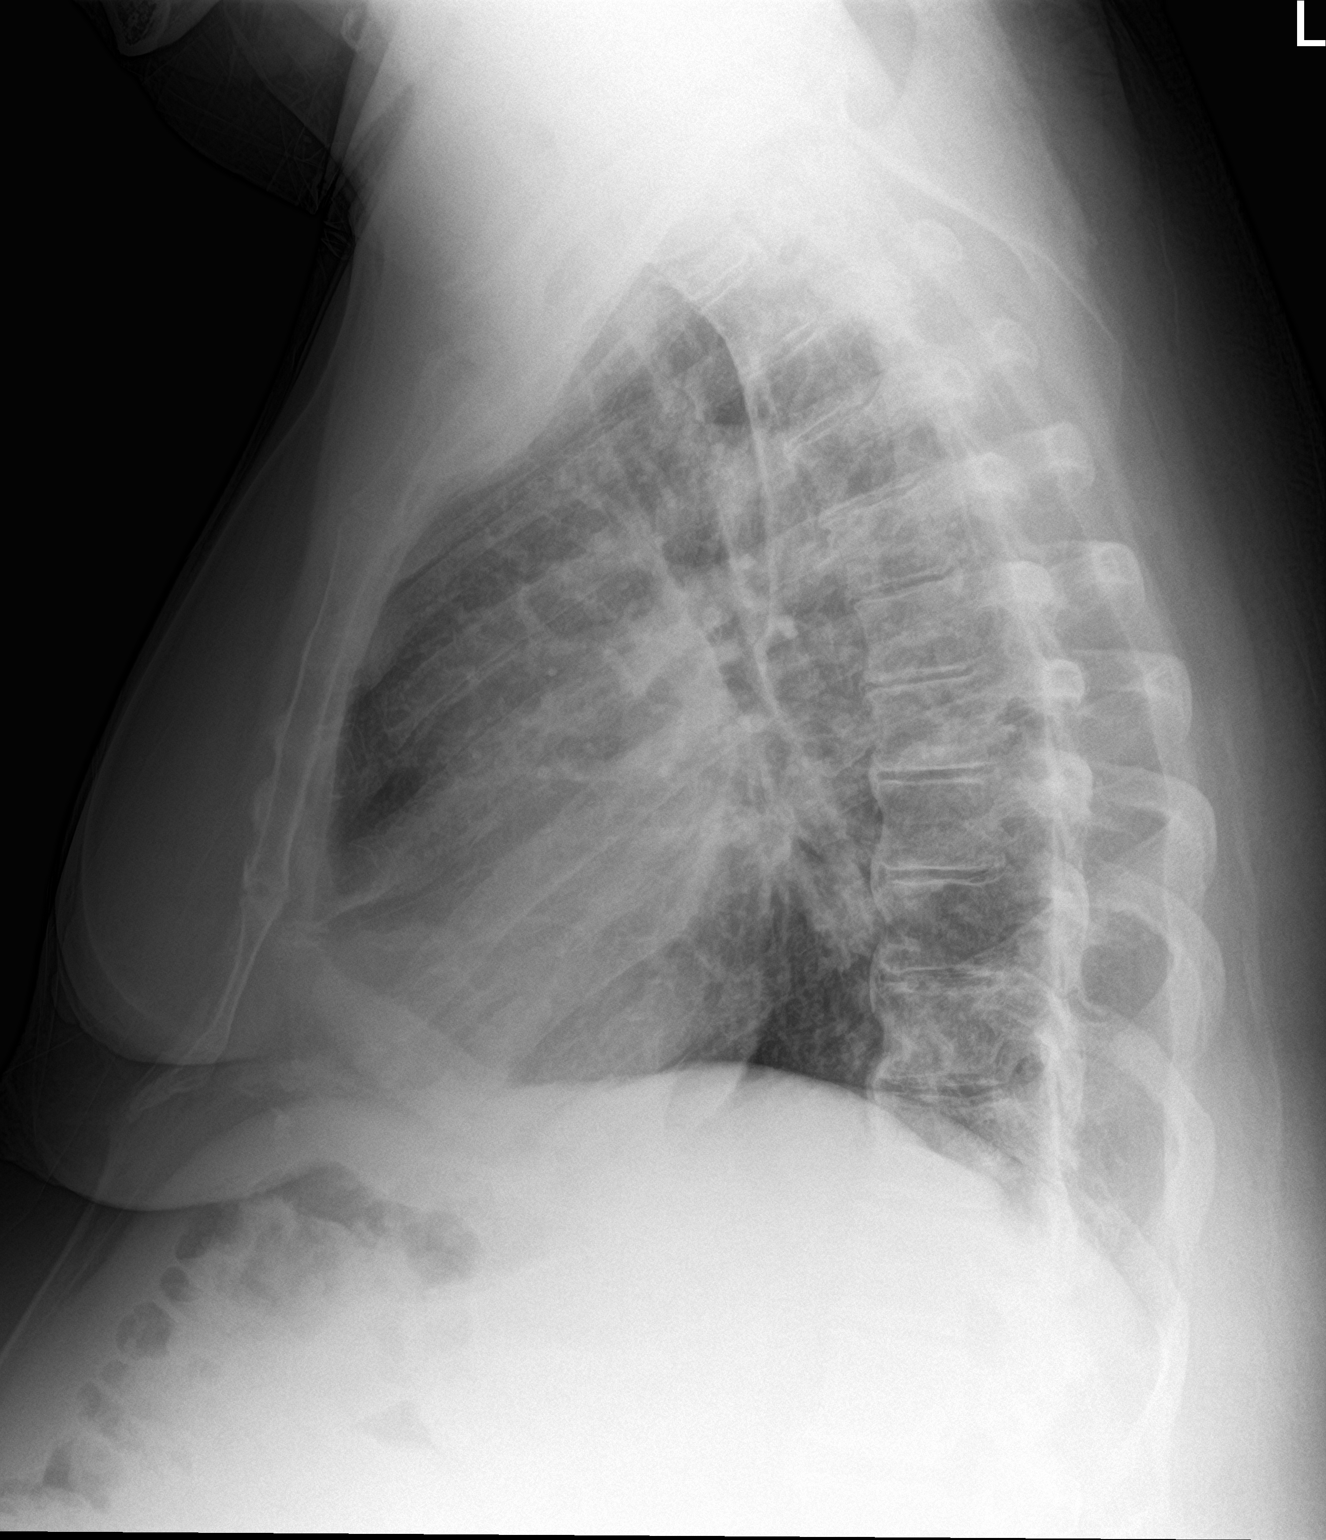

[2 of 2 positions shown; findings below may reference images not displayed]

FINDINGS: The heart size and mediastinal contours are within normal limits.
Both lungs are clear. The visualized skeletal structures are
unremarkable.
IMPRESSION: No active cardiopulmonary disease.

## 2018-05-20 ENCOUNTER — Ambulatory Visit
Admission: RE | Admit: 2018-05-20 | Discharge: 2018-05-20 | Disposition: A | Discharge status: Home / Self Care | Payer: 59 | Source: Ambulatory Visit | Attending: Internal Medicine | Admitting: Internal Medicine

## 2018-05-20 ENCOUNTER — Ambulatory Visit: Payer: 59

## 2018-05-20 DIAGNOSIS — Z1231 Encounter for screening mammogram for malignant neoplasm of breast: Secondary | ICD-10-CM

## 2018-05-20 MED FILL — PREMARIN 0.625 MG TABLET: 0.625 | 30 days supply | Qty: 30 | Fill #1

## 2018-05-20 MED FILL — ACCU-CHEK GUIDE TEST STRIP: 25 days supply | Qty: 50 | Fill #9

## 2018-05-20 MED FILL — LOSARTAN POTASSIUM 50 MG TA: 50 | 90 days supply | Qty: 90 | Fill #0

## 2018-05-20 MED FILL — ALPRAZolam 0.5 MG TABS: 0.5 | 30 days supply | Qty: 60 | Fill #0

## 2018-05-20 MED FILL — CLOBETASOL PROP 0.05% FOAM: 0.05 | 30 days supply | Qty: 100 | Fill #2

## 2018-05-24 ENCOUNTER — Ambulatory Visit (INDEPENDENT_AMBULATORY_CARE_PROVIDER_SITE_OTHER): Payer: 59 | Admitting: Internal Medicine

## 2018-05-25 MED FILL — METOPROLOL SUCCINATE ER 100: 100 | 90 days supply | Qty: 90 | Fill #0

## 2018-05-25 MED FILL — PANTOPRAZOLE SOD DR 40 MG T: 40 | 90 days supply | Qty: 90 | Fill #2

## 2018-05-25 MED FILL — LEVOCETIRIZINE 5 MG TABLET: 5 | 90 days supply | Qty: 90 | Fill #1

## 2018-05-27 DIAGNOSIS — M7521 Bicipital tendinitis, right shoulder: Secondary | ICD-10-CM | POA: Diagnosis not present

## 2018-05-27 DIAGNOSIS — Z1389 Encounter for screening for other disorder: Secondary | ICD-10-CM | POA: Diagnosis not present

## 2018-05-27 DIAGNOSIS — M7522 Bicipital tendinitis, left shoulder: Secondary | ICD-10-CM | POA: Diagnosis not present

## 2018-05-27 DIAGNOSIS — Z6841 Body Mass Index (BMI) 40.0 and over, adult: Secondary | ICD-10-CM | POA: Diagnosis not present

## 2018-06-03 ENCOUNTER — Ambulatory Visit: Payer: 59 | Admitting: Internal Medicine

## 2018-06-03 ENCOUNTER — Encounter: Payer: Self-pay | Admitting: Internal Medicine

## 2018-06-03 VITALS — BP 130/78 | HR 87 | Ht 63.0 in | Wt 239.0 lb

## 2018-06-03 DIAGNOSIS — E782 Mixed hyperlipidemia: Secondary | ICD-10-CM

## 2018-06-03 DIAGNOSIS — I700 Atherosclerosis of aorta: Secondary | ICD-10-CM | POA: Diagnosis not present

## 2018-06-03 DIAGNOSIS — I10 Essential (primary) hypertension: Secondary | ICD-10-CM | POA: Diagnosis not present

## 2018-06-03 NOTE — Progress Notes (Signed)
Cardiology Office Note   Date:  06/03/2018   ID:  SAPHYRA HUTT, DOB 1961-09-11, MRN [id number]  PCP:  Redmond School, MD  Cardiologist:   Dorris Carnes, MD   Pt presents for f/u of HTN     History of Present Illness: [name] is a 57 y.o. female with a history of HTN, DM, obesity, diverticulitis, gastritis  FHX of CHF in father, HTN and DM  I saw her in 2017  Since seen the pt has done well   She demies CP   No SOB   Walks at work Is using wt wat[employer] new plan   Wt down 8 lbs   Thinks [employer] wat[employer]  is going to work Glu running 110s to 120s      Outpatient Medications Prior to Visit  Medication Sig Dispense Refill  . acetaminophen (TYLENOL) 500 MG tablet Take 1,000 mg by mouth at bedtime as needed. For pain    . albuterol (PROVENTIL HFA;VENTOLIN HFA) 108 (90 Base) MCG/ACT inhaler Inhale 1-2 puffs into the lungs every 6 (six) hours as needed for wheezing or shortness of breath.    . ALPRAZolam (XANAX) 0.5 MG tablet Take 0.5 mg by mouth 2 (two) times daily as needed for anxiety.     Marland Kitchen amLODipine (NORVASC) 10 MG tablet Take 10 mg by mouth daily.    Marland Kitchen aspirin EC 81 MG tablet Take 81 mg by mouth daily.    Marland Kitchen estrogens, conjugated, (PREMARIN) 0.625 MG tablet Take 0.625 mg by mouth daily.     Marland Kitchen levocetirizine (XYZAL) 5 MG tablet Take 5 mg by mouth daily.    Marland Kitchen losartan (COZAAR) 25 MG tablet Take 50 mg by mouth daily.     . metFORMIN (GLUCOPHAGE) 500 MG tablet Take 500 mg by mouth 2 (two) times daily.  3  . metoCLOPramide (REGLAN) 10 MG tablet Take 1 tablet (10 mg total) by mouth every 8 (eight) hours as needed for nausea. 10 tablet 0  . metoprolol succinate (TOPROL-XL) 50 MG 24 hr tablet Take 1 tablet by mouth daily.  11  . montelukast (SINGULAIR) 10 MG tablet Take 10 mg by mouth at bedtime.    . ranitidine (ZANTAC) 150 MG tablet Take 150 mg by mouth daily.    . sitaGLIPtin (JANUVIA) 100 MG tablet Take 100 mg by mouth daily.    . ondansetron (ZOFRAN-ODT) 4 MG disintegrating  tablet Take 1 tablet (4 mg total) by mouth every 8 (eight) hours as needed for nausea or vomiting. 8 tablet 0  . pantoprazole (PROTONIX) 40 MG tablet Take 40 mg by mouth daily.    Marland Kitchen omeprazole (PRILOSEC) 20 MG capsule Take 1 capsule (20 mg total) by mouth daily. 14 capsule 0  . traMADol (ULTRAM) 50 MG tablet Take 1 tablet (50 mg total) by mouth every 6 (six) hours as needed. 15 tablet 0   No facility-administered medications prior to visit.      Allergies:   Barium-containing compounds; Statins; Sulfa antibiotics; and Victoza [liraglutide]   Past Medical History:  Diagnosis Date  . Anxiety   . Asthma    diagnosed as an adult  . Diabetes mellitus   . Diverticulosis 01/13/2018  . Hypertension     Past Surgical History:  Procedure Laterality Date  . ABDOMINAL HYSTERECTOMY    . CARPAL TUNNEL RELEASE    . CESAREAN SECTION    . CHOLECYSTECTOMY    . SINUS EXPLORATION    . TRIGGER FINGER RELEASE    .  WRIST SURGERY       Social History:  The patient  reports that she has quit smoking. She has never used smokeless tobacco. She reports that she does not drink alcohol or use drugs.   Family History:  The patient's family history includes Diabetes in her mother; Hypertension in her mother; Stroke in her mother.    ROS:  Please see the history of present illness. All other systems are reviewed and  Negative to the above problem except as noted.    PHYSICAL EXAM: VS:  BP 130/78 (BP Location: Left Arm)   Pulse 87   Ht 5\' 3"  (1.6 m)   Wt 108.4 kg (239 lb)   SpO2 97%   BMI 42.34 kg/m   GEN: Morbidly obese 57 yo  in no acute distress  HEENT: normal  Neck: no JVD, carotid bruits, or masses Cardiac: RRR; no murmurs, rubs, or gallops,no edema  Respiratory:  clear to auscultation bilaterally, normal work of breathing  Rare uppper airway wheeze  GI: soft, nontender, nondistended, + BS  No hepatomegaly  MS: no deformity Moving all extremities   Skin: warm and dry, no rash Neuro:   Strength and sensation are intact Psych: euthymic mood, full affect   EKG:  EKG is not ordered today.   Lipid Panel    Component Value Date/Time   CHOL 172 09/04/2016 0700   TRIG 203 (H) 09/04/2016 0700   HDL 61 09/04/2016 0700   CHOLHDL 2.8 09/04/2016 0700   VLDL 41 (H) 09/04/2016 0700   LDLCALC 70 09/04/2016 0700      Wt Readings from Last 3 Encounters:  06/03/18 108.4 kg (239 lb)  05/08/18 111.6 kg (246 lb)  01/13/18 110.9 kg (244 lb 9.6 oz)      ASSESSMENT AND PLAN:  Pt is a 57 yo with no history of CAD  Hx of DM and HTN    1  HTN  BP is controlled  Keep on same meds  2  Hx mild atherosclerosis of aorta   Will get lipids from Dr Gerarda Fraction  Needs control  3  DM  Encouraged her to keep up with wt watchers   4.  Morbid obesity  Again, losing wt   Encouraged her to continue   Keep active       Signed, Dorris Carnes, MD  06/03/2018 10:07 AM    [address], [place], [place]  [address] Phone: [phone]; Fax: [phone]

## 2018-06-03 NOTE — Patient Instructions (Addendum)

## 2018-06-08 IMAGING — CT CT MAXILLOFACIAL W/O CM
3 of 4 series · 14 of 47 positions shown, 16 images · non-contrast
Comparison: None.

CLINICAL DATA: Frontal and maxillary sinus pain and pressure over
the last 6 months.

EXAM:
CT MAXILLOFACIAL WITHOUT CONTRAST
TECHNIQUE: 04/21/2012

[Series 2: standard · axial · 0.33mm/px · z∈[+82,+190]mm · 8 of 129 slices shown, 10 images]
[im 10/129  brain]
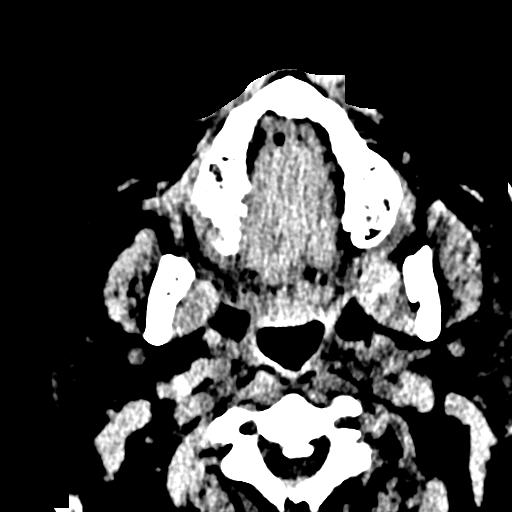
[im 10/129  bone]
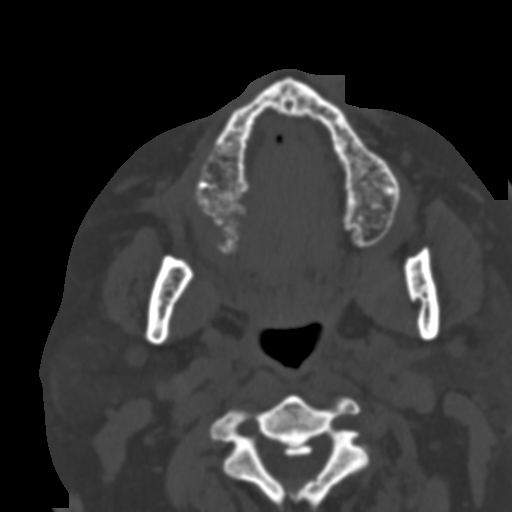
[im 28/129  bone]
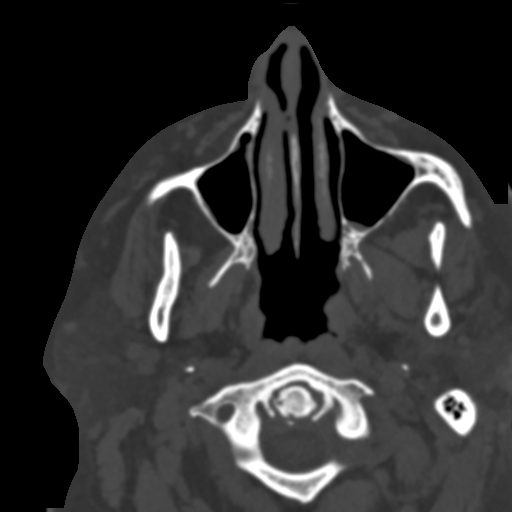
[im 46/129  bone]
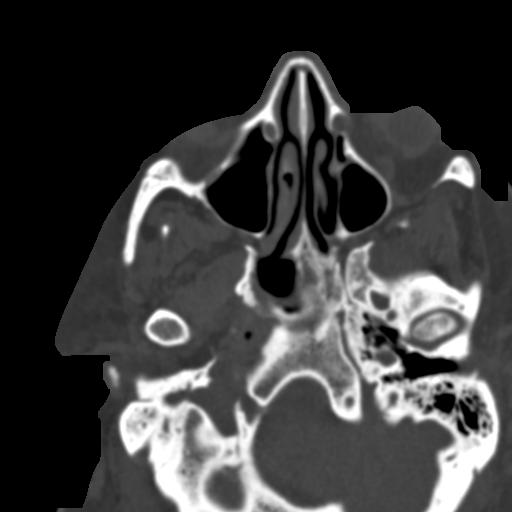
[im 55/129  bone]
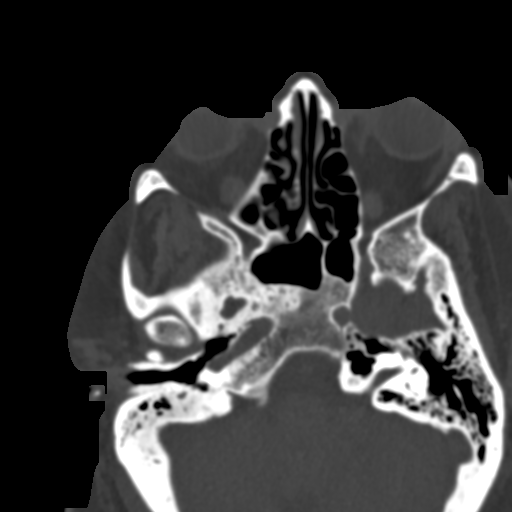
[im 74/129  brain]
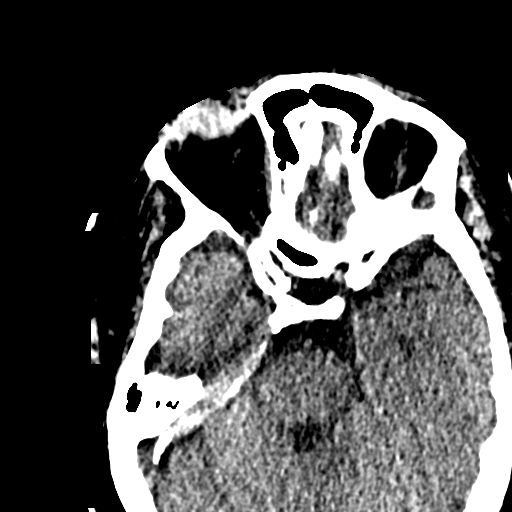
[im 74/129  bone]
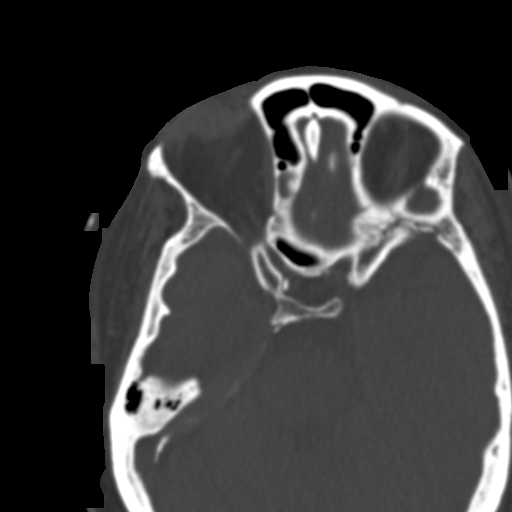
[im 83/129  bone]
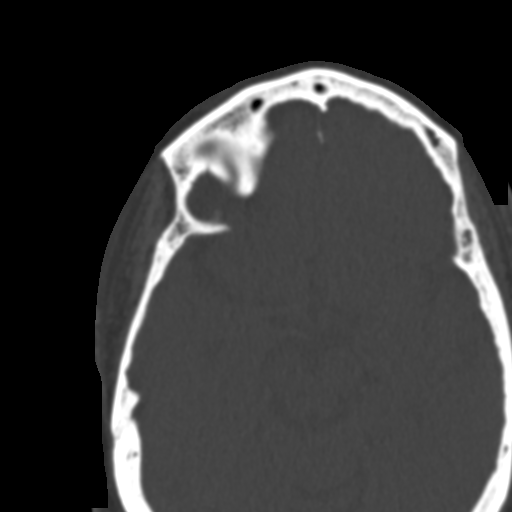
[im 101/129  bone]
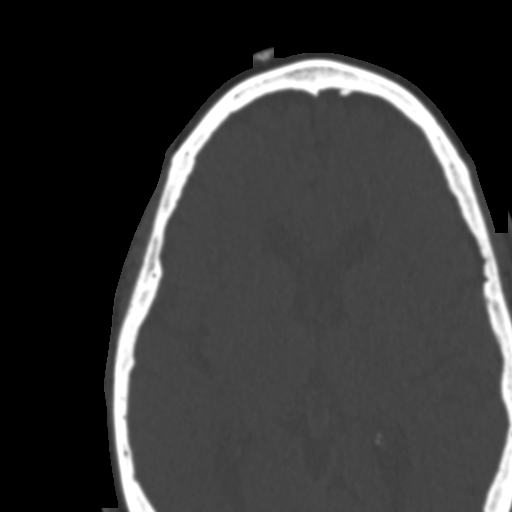
[im 119/129  bone]
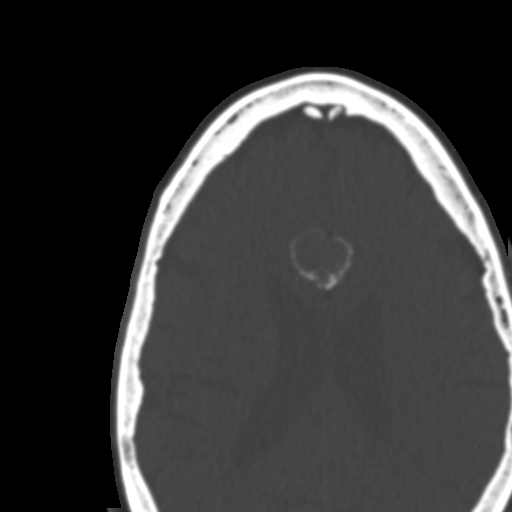

[Series 5: coronal · coronal · 0.28mm/px · 3 of 112 slices shown]
[im 38/112  bone]
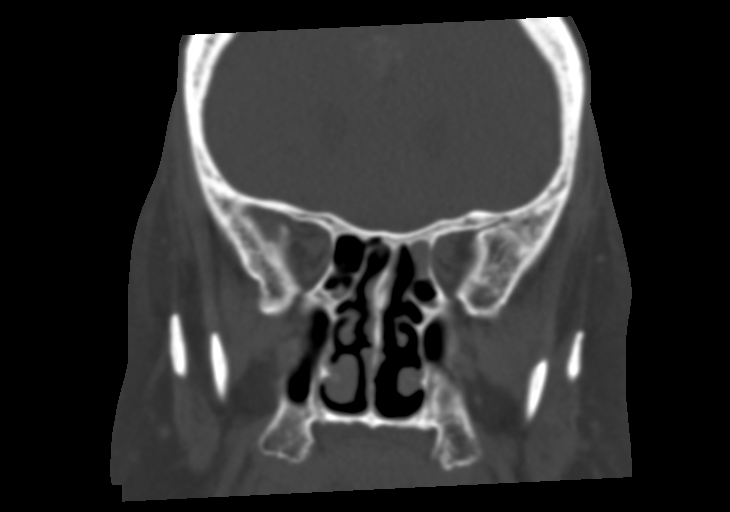
[im 50/112  bone]
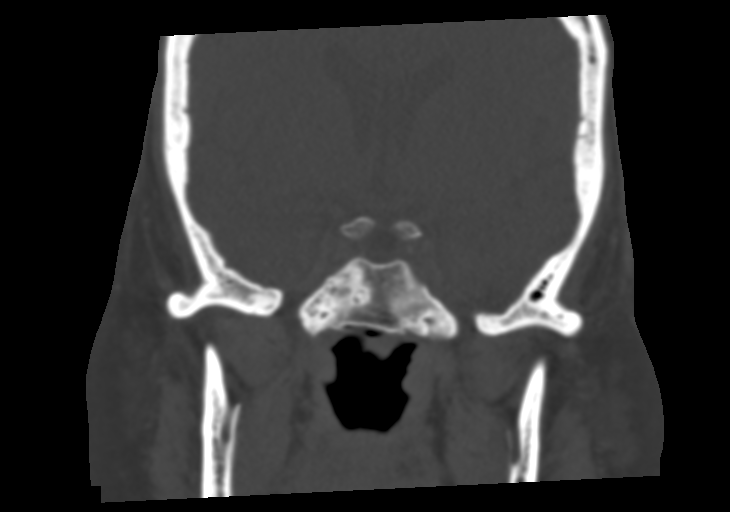
[im 62/112  bone]
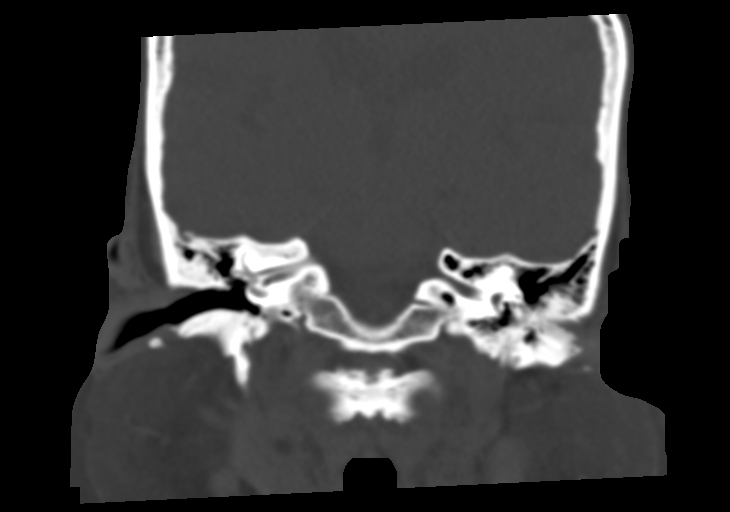

[Series 6: sagittal · sagittal · 0.27mm/px · 3 of 100 slices shown]
[im 34/100  bone]
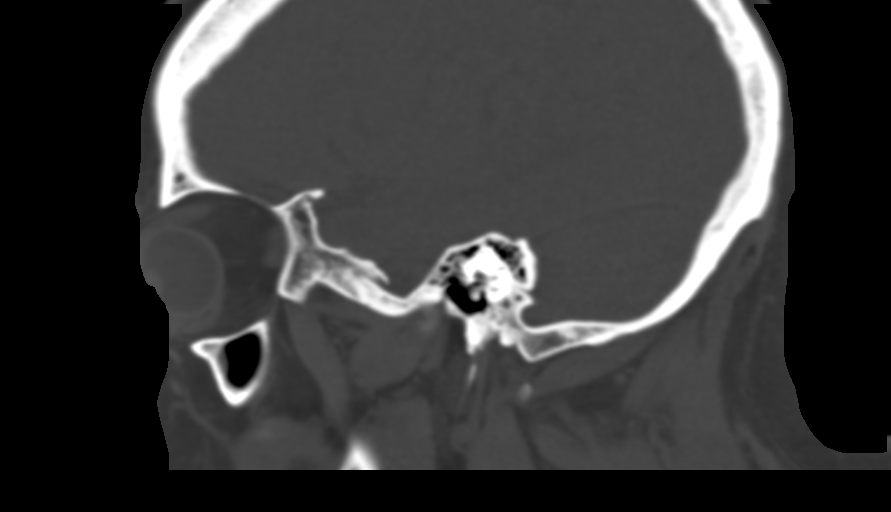
[im 50/100  bone]
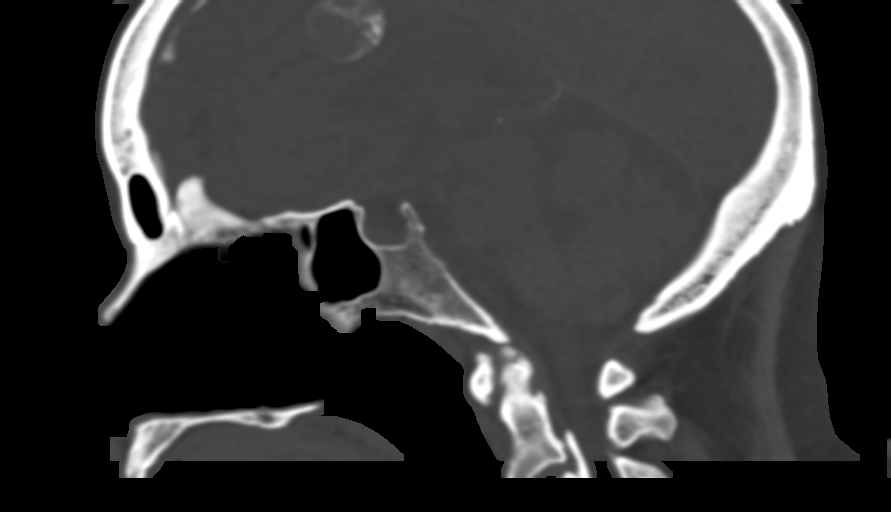
[im 67/100  bone]
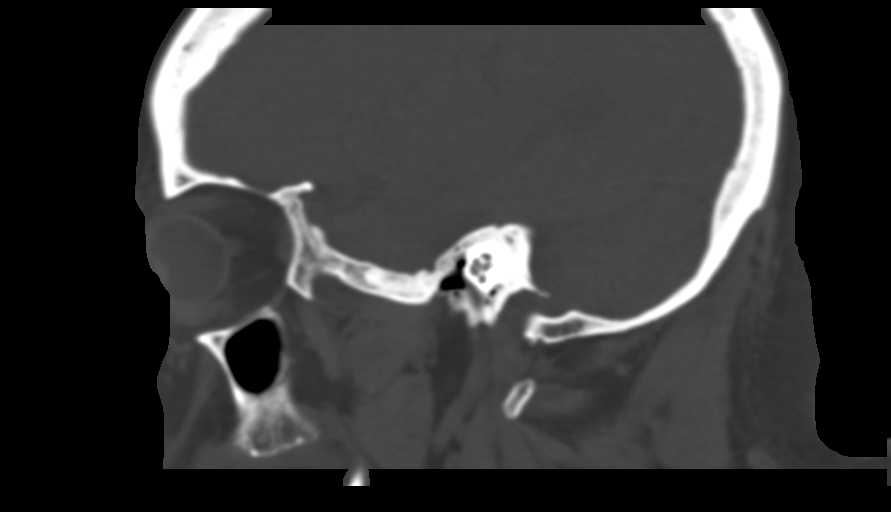

[14 of 47 positions shown; findings below may reference images not displayed]

FINDINGS: Frontal sinuses:  Clear and normal.

Ethmoid sinuses: Scattered opacified ethmoid air cells, more
numerous on the left than the right.

Sphenoid sinus:  Clear and normal.

Maxillary sinuses: Mild mucosal thickening along the maxillary sinus
floors. No free fluid.

Nasal septum is midline.

Ostiomeatal complexes are widely patent bilaterally. No unfavorable
anatomic variants.

Olfactory grooves are symmetric and slightly deep. Lamina papyracea
intact. Anterior ethmoidal vascular notches are symmetric and
normal. Clival pneumatization is pre sellar.

Calcified mass measuring 2 cm in diameter is seen in the midline in
the anterior interhemispheric fissure, likely to represent a
meningioma. Brain imaging suggested if not already done elsewhere.

Other soft tissues of the facial region appear unremarkable.
IMPRESSION: Scattered opacified ethmoid air cells bilaterally left more than
right. Mild mucosal thickening along the maxillary sinus floors. No
evidence of advanced sinus disease. Ostiomeatal complexes within
normal limits in sufficiently patent.

2 cm calcified mass of the anterior interhemispheric fissure, likely
to represent a meningioma. Brain imaging suggested if not previously
performed.

## 2018-06-15 MED FILL — AZITHROMYCIN 250 MG TABLET: 250 | 5 days supply | Qty: 6 | Fill #0

## 2018-06-21 MED FILL — PREMARIN 0.625 MG TABLET: 0.625 | 30 days supply | Qty: 30 | Fill #2

## 2018-06-21 MED FILL — ALPRAZolam 0.5 MG TABS: 0.5 | 30 days supply | Qty: 60 | Fill #1

## 2018-07-11 MED FILL — glipiZIDE 5 MG TABS: 5 | 90 days supply | Qty: 90 | Fill #1

## 2018-07-11 MED FILL — CLOBETASOL PROP 0.05% FOAM: 0.05 | 30 days supply | Qty: 100 | Fill #0

## 2018-07-11 MED FILL — JANUVIA 100 MG TABLET: 100 | 90 days supply | Qty: 90 | Fill #1

## 2018-07-11 MED FILL — ACCU-CHEK GUIDE TEST STRIP: 25 days supply | Qty: 50 | Fill #0

## 2018-07-13 DIAGNOSIS — H5213 Myopia, bilateral: Secondary | ICD-10-CM | POA: Diagnosis not present

## 2018-07-13 DIAGNOSIS — E1165 Type 2 diabetes mellitus with hyperglycemia: Secondary | ICD-10-CM | POA: Diagnosis not present

## 2018-07-14 DIAGNOSIS — M79645 Pain in left finger(s): Secondary | ICD-10-CM | POA: Diagnosis not present

## 2018-07-22 MED FILL — ALPRAZolam 0.5 MG TABS: 0.5 | 30 days supply | Qty: 60 | Fill #2

## 2018-07-22 MED FILL — AMLODIPINE BESYLATE 10 MG T: 10 | 90 days supply | Qty: 90 | Fill #3

## 2018-07-22 MED FILL — PREMARIN 0.625 MG TABLET: 0.625 | 30 days supply | Qty: 30 | Fill #3

## 2018-08-03 DIAGNOSIS — E119 Type 2 diabetes mellitus without complications: Secondary | ICD-10-CM | POA: Diagnosis not present

## 2018-08-03 DIAGNOSIS — Z1389 Encounter for screening for other disorder: Secondary | ICD-10-CM | POA: Diagnosis not present

## 2018-08-03 DIAGNOSIS — Z6841 Body Mass Index (BMI) 40.0 and over, adult: Secondary | ICD-10-CM | POA: Diagnosis not present

## 2018-08-03 DIAGNOSIS — M542 Cervicalgia: Secondary | ICD-10-CM | POA: Diagnosis not present

## 2018-08-03 DIAGNOSIS — M25511 Pain in right shoulder: Secondary | ICD-10-CM | POA: Diagnosis not present

## 2018-08-03 DIAGNOSIS — G5601 Carpal tunnel syndrome, right upper limb: Secondary | ICD-10-CM | POA: Diagnosis not present

## 2018-08-03 DIAGNOSIS — I10 Essential (primary) hypertension: Secondary | ICD-10-CM | POA: Diagnosis not present

## 2018-08-12 MED FILL — ACCU-CHEK GUIDE TEST STRIP: 25 days supply | Qty: 50 | Fill #1

## 2018-08-12 MED FILL — METFORMIN HCL ER 500 MG TAB: 500 | 90 days supply | Qty: 180 | Fill #0

## 2018-08-12 MED FILL — PANTOPRAZOLE SOD DR 40 MG T: 40 | 90 days supply | Qty: 90 | Fill #0

## 2018-08-16 MED FILL — DICLOFENAC SODIUM 1% GEL: 1 | 7 days supply | Qty: 100 | Fill #0

## 2018-08-22 MED FILL — PREMARIN 0.625 MG TABLET: 0.625 | 30 days supply | Qty: 30 | Fill #4

## 2018-08-22 MED FILL — ALPRAZolam 0.5 MG TABS: 0.5 | 30 days supply | Qty: 60 | Fill #0

## 2018-08-22 MED FILL — LOSARTAN POTASSIUM 50 MG TA: 50 | 90 days supply | Qty: 90 | Fill #1

## 2018-08-22 MED FILL — METOPROLOL SUCCINATE ER 100: 100 | 90 days supply | Qty: 90 | Fill #1

## 2018-08-22 MED FILL — LEVOCETIRIZINE 5 MG TABLET: 5 | 90 days supply | Qty: 90 | Fill #2

## 2018-09-26 MED FILL — ALPRAZolam 0.5 MG TABS: 0.5 | 30 days supply | Qty: 60 | Fill #1

## 2018-09-26 MED FILL — PREMARIN 0.625 MG TABLET: 0.625 | 30 days supply | Qty: 30 | Fill #5

## 2018-09-26 MED FILL — ACCU-CHEK GUIDE TEST STRIP: 25 days supply | Qty: 50 | Fill #0

## 2018-09-26 MED FILL — CLOBETASOL PROP 0.05% FOAM: 0.05 | 30 days supply | Qty: 100 | Fill #1

## 2018-09-27 DIAGNOSIS — Z0000 Encounter for general adult medical examination without abnormal findings: Secondary | ICD-10-CM | POA: Diagnosis not present

## 2018-09-27 DIAGNOSIS — E119 Type 2 diabetes mellitus without complications: Secondary | ICD-10-CM | POA: Diagnosis not present

## 2018-09-27 DIAGNOSIS — J329 Chronic sinusitis, unspecified: Secondary | ICD-10-CM | POA: Diagnosis not present

## 2018-09-27 DIAGNOSIS — Z1389 Encounter for screening for other disorder: Secondary | ICD-10-CM | POA: Diagnosis not present

## 2018-09-27 DIAGNOSIS — K219 Gastro-esophageal reflux disease without esophagitis: Secondary | ICD-10-CM | POA: Diagnosis not present

## 2018-09-27 DIAGNOSIS — Z6841 Body Mass Index (BMI) 40.0 and over, adult: Secondary | ICD-10-CM | POA: Diagnosis not present

## 2018-09-27 DIAGNOSIS — I10 Essential (primary) hypertension: Secondary | ICD-10-CM | POA: Diagnosis not present

## 2018-10-04 DIAGNOSIS — E0590 Thyrotoxicosis, unspecified without thyrotoxic crisis or storm: Secondary | ICD-10-CM | POA: Diagnosis not present

## 2018-10-12 DIAGNOSIS — R0982 Postnasal drip: Secondary | ICD-10-CM | POA: Diagnosis not present

## 2018-10-12 DIAGNOSIS — J45909 Unspecified asthma, uncomplicated: Secondary | ICD-10-CM | POA: Diagnosis not present

## 2018-10-12 DIAGNOSIS — Z6841 Body Mass Index (BMI) 40.0 and over, adult: Secondary | ICD-10-CM | POA: Diagnosis not present

## 2018-10-12 DIAGNOSIS — R070 Pain in throat: Secondary | ICD-10-CM | POA: Diagnosis not present

## 2018-10-12 DIAGNOSIS — R05 Cough: Secondary | ICD-10-CM | POA: Diagnosis not present

## 2018-10-12 MED FILL — PROAIR HFA 90 MCG INHALER: 108 (90 BAS | 25 days supply | Qty: 9 | Fill #0

## 2018-10-12 MED FILL — EPINEPHRINE 0.3 MG AUTO-INJ: 0.3 | 30 days supply | Qty: 2 | Fill #0

## 2018-10-13 MED FILL — glipiZIDE 5 MG TABS: 5 | 90 days supply | Qty: 90 | Fill #2

## 2018-10-13 MED FILL — JANUVIA 100 MG TABLET: 100 | 90 days supply | Qty: 90 | Fill #2

## 2018-10-24 MED FILL — ACCU-CHEK GUIDE TEST STRIP: 25 days supply | Qty: 50 | Fill #1

## 2018-10-24 MED FILL — PREMARIN 0.625 MG TABLET: 0.625 | 30 days supply | Qty: 30 | Fill #6

## 2018-10-24 MED FILL — ALPRAZolam 0.5 MG TABS: 0.5 | 30 days supply | Qty: 60 | Fill #2

## 2018-10-24 MED FILL — AMLODIPINE BESYLATE 10 MG T: 10 | 90 days supply | Qty: 90 | Fill #0

## 2018-11-04 ENCOUNTER — Ambulatory Visit: Payer: 59 | Admitting: Allergy & Immunology

## 2018-11-04 ENCOUNTER — Encounter: Payer: Self-pay | Admitting: Allergy & Immunology

## 2018-11-04 VITALS — BP 142/80 | HR 73 | Temp 97.9°F | Resp 18 | Ht 63.0 in | Wt 247.0 lb

## 2018-11-04 DIAGNOSIS — J302 Other seasonal allergic rhinitis: Secondary | ICD-10-CM | POA: Diagnosis not present

## 2018-11-04 DIAGNOSIS — J3089 Other allergic rhinitis: Secondary | ICD-10-CM

## 2018-11-04 DIAGNOSIS — B999 Unspecified infectious disease: Secondary | ICD-10-CM | POA: Diagnosis not present

## 2018-11-04 DIAGNOSIS — J4520 Mild intermittent asthma, uncomplicated: Secondary | ICD-10-CM | POA: Diagnosis not present

## 2018-11-04 MED ORDER — FLUTICASONE PROPIONATE 50 MCG/ACT NA SUSP: 2.0000 | Freq: Two times a day (BID) | NASAL | 3 refills | Status: DC | PRN

## 2018-11-04 MED ORDER — AZELASTINE HCL 0.15 % NA SOLN: 2.0000 | Freq: Two times a day (BID) | NASAL | 5 refills | Status: DC | PRN

## 2018-11-04 MED FILL — FLUTICASONE PROP 50 MCG SPR: 50 | 30 days supply | Qty: 16 | Fill #0

## 2018-11-04 NOTE — Progress Notes (Signed)
NEW PATIENT  Date of Service/Encounter:  11/04/18  Referring provider: Redmond School, MD   Assessment:   Mild intermittent asthma, uncomplicated  Seasonal and perennial allergic rhinitis (grasses, weeds, trees, indoor and outdoor molds, dust mites, cat, dog, cockroach)  Recurrent infections  Plan/Recommendations:  2 1. Mild intermittent asthma, uncomplicated - Lung testing looked fairly good today. - We will not make any changes today. - Continue with albuterol 2-4 puffs every 4-6 hours as needed  2. Seasonal and perennial allergic rhinitis - We can use your testing from 2015 to do allergy shot prescriptions, so we did not do repeat testing today. - Stop taking: Flonase - Continue with: Allegra (fexofenadine) 180mg  table once daily - Start taking: Ryvent (carbinoxamine) 6mg  tablet 3-4 times daily as needed and Dymista (fluticasone/azelastine) two sprays per nostril 1-2 times daily as needed - You can use an extra dose of the antihistamine, if needed, for breakthrough symptoms.  - Consider nasal saline rinses 1-2 times daily to remove allergens from the nasal cavities as well as help with mucous clearance (this is especially helpful to do before the nasal sprays are given) - Allergy shot consent signed today. - Make an appointment in 2 weeks for your first allergy injection.   3. Recurrent infections - You has a history of recurrent infections, in particular pneumonias and sinus infections. - We will obtain some screening labs to evaluate your immune system.  - Labs to evaluate the quantitative [facility]) aspects of your immune system: IgG/IgA/IgM - Labs to evaluate the qualitative (HOW WELL THEY WORK)  aspects of your immune system: CH50, Pneumococcal titers, Tetanus titers, Diphtheria titers - We may consider immunizations with Pneumovax and Tdap to challenge your immune system, and then obtain repeat titers in 4-6 weeks.   4. Return in about 3 months (around  02/04/2019).   Subjective:   [name] is a 57 y.o. female presenting today for evaluation of No chief complaint on file.   [name] has a history of the following: Patient Active Problem List   Diagnosis Date Noted  . Diverticulosis 01/13/2018  . Intractable nausea and vomiting 12/25/2016  . Diabetes ([place]) 09/02/2016  . Asthma 09/02/2016  . MORBID OBESITY 07/02/2009  . Essential hypertension 07/02/2009  . CHEST DISCOMFORT 07/02/2009    History obtained from: chart review and patient and her old records here in our clinic.   [name] was referred by Redmond School, MD.     [name] is a 57 y.o. female presenting for an evaluation of allergic rhinitis as well as asthma.  She was last seen by Dr. Ishmael Holter in April 2016.  At that time, Qvar 80 mcg 2 puffs twice daily was restarted.  She was started on Dymista 1 spray per nostril twice daily in lieu of Qnasl.  She was continued on Singulair and Allegra.  She did make the decision to start allergy injections at that time.  It is not clear how long she actually remained on them.   Asthma/Respiratory Symptom History: She has had reversibility in the past, most recently in September 2015.  She does have an albuterol inhaler.  She is to twice last year.  She denies coughing, shortness of breath, or wheezing.  She has had no ER visits for wheezing.  She does not remember the last time she needed prednisone.  Allergic Rhinitis Symptom History: Her last testing was performed in September 2015 and was positive to grasses, weeds, trees, indoor and outdoor molds,  dust mite, cat, dog, and cockroach.  Currently, she is on Xyzal daily as well as Allegra daily.  She was on Singulair, but she stopped it.  She is on Flonase 1 spray per nostril daily.  Her symptoms are year-round.  She was on immunotherapy a decade or 2 ago.  She reports that when she was here seeing Dr. Ishmael Holter, she never received a call to start her shots.  Therefore,  she stopped following up with [place].  Instead, she went to see [facility], where she received allergy injections weekly for approximately 18 months.  However, she got tired of driving to [place] so she has been off of shots for approximately 1 year.  She eats all of the major food allergens without adverse events. Otherwise, there is no history of other atopic diseases, including food allergies, drug allergies, stinging insect allergies, eczema or urticaria. There is no significant infectious history. Vaccinations are up to date.    Past Medical History: Patient Active Problem List   Diagnosis Date Noted  . Diverticulosis 01/13/2018  . Intractable nausea and vomiting 12/25/2016  . Diabetes ([place]) 09/02/2016  . Asthma 09/02/2016  . MORBID OBESITY 07/02/2009  . Essential hypertension 07/02/2009  . CHEST DISCOMFORT 07/02/2009    Medication List:  Allergies as of 11/04/2018      Reactions   Barium-containing Compounds Itching, Other (See Comments)   Weakness, loss of voice   Statins Other (See Comments)   Myalgia, muscle weakness   Sulfa Antibiotics Itching, Other (See Comments)   Weakness, loss of voice   Victoza [liraglutide] Diarrhea, Nausea Only      Medication List        Accurate as of 11/04/18 11:59 PM. Always use your most recent med list.          acetaminophen 500 MG tablet Commonly known as:  TYLENOL Take 1,000 mg by mouth at bedtime as needed. For pain   albuterol 108 (90 Base) MCG/ACT inhaler Commonly known as:  PROVENTIL HFA;VENTOLIN HFA Inhale 1-2 puffs into the lungs every 6 (six) hours as needed for wheezing or shortness of breath.   ALPRAZolam 0.5 MG tablet Commonly known as:  XANAX Take 0.5 mg by mouth 2 (two) times daily as needed for anxiety.   amLODipine 10 MG tablet Commonly known as:  NORVASC Take 10 mg by mouth daily.   aspirin EC 81 MG tablet Take 81 mg by mouth daily.   Azelastine HCl 0.15 % Soln Place 2 sprays into both  nostrils 2 (two) times daily as needed.   EPINEPHrine 0.3 mg/0.3 mL Soaj injection Commonly known as:  EPI-PEN   estrogens (conjugated) 0.625 MG tablet Commonly known as:  PREMARIN Take 0.625 mg by mouth daily.   fexofenadine 180 MG tablet Commonly known as:  ALLEGRA Take by mouth.   fluticasone 50 MCG/ACT nasal spray Commonly known as:  FLONASE Place 2 sprays into both nostrils 2 (two) times daily as needed for allergies or rhinitis.   glipiZIDE 5 MG tablet Commonly known as:  GLUCOTROL   levocetirizine 5 MG tablet Commonly known as:  XYZAL Take 5 mg by mouth daily.   losartan 25 MG tablet Commonly known as:  COZAAR Take 50 mg by mouth daily.   metFORMIN 500 MG tablet Commonly known as:  GLUCOPHAGE Take 500 mg by mouth 2 (two) times daily.   metoprolol succinate 50 MG 24 hr tablet Commonly known as:  TOPROL-XL Take 2 tablets by mouth daily.   montelukast  10 MG tablet Commonly known as:  SINGULAIR Take 10 mg by mouth at bedtime.   pantoprazole 40 MG tablet Commonly known as:  PROTONIX Take by mouth.   ranitidine 150 MG tablet Commonly known as:  ZANTAC Take 150 mg by mouth daily.   sitaGLIPtin 100 MG tablet Commonly known as:  JANUVIA Take 100 mg by mouth daily.       Birth History: non-contributory  Developmental History: non-contributory.   Past Surgical History: Past Surgical History:  Procedure Laterality Date  . ABDOMINAL HYSTERECTOMY    . CARPAL TUNNEL RELEASE    . CESAREAN SECTION    . CHOLECYSTECTOMY    . SINUS EXPLORATION    . TRIGGER FINGER RELEASE    . WRIST SURGERY       Family History: Family History  Problem Relation Age of Onset  . Hypertension Mother   . Diabetes Mother   . Stroke Mother      Social History: Sayaka lives at home with her 47 year old daughter.  They live in a house that is 74+ years old.  There is carpeting throughout the home.  She has gas heating and window units for cooling.  There are no animals  inside or outside of the home.  She does not have dust mite covers on the bedding.  There is no tobacco exposure.  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     Review of Systems: a 14-point review of systems is pertinent for what is mentioned in HPI.  Otherwise, all other systems were negative. Constitutional: negative other than that listed in the HPI Eyes: negative other than that listed in the HPI Ears, nose, mouth, throat, and face: negative other than that listed in the HPI Respiratory: negative other than that listed in the HPI Cardiovascular: negative other than that listed in the HPI Gastrointestinal: negative other than that listed in the HPI Genitourinary: negative other than that listed in the HPI Integument: negative other than that listed in the HPI Hematologic: negative other than that listed in the HPI Musculoskeletal: negative other than that listed in the HPI Neurological: negative other than that listed in the HPI Allergy/Immunologic: negative other than that listed in the HPI    Objective:   Blood pressure (!) 142/80, pulse 73, temperature 97.9 F (36.6 C), temperature source Oral, resp. rate 18, height 5\' 3"  (1.6 m), weight 247 lb (112 kg), SpO2 97 %. Body mass index is 43.75 kg/m.   Physical Exam:  General: Alert, interactive, in no acute distress. Boisterous and engaging.  Eyes: No conjunctival injection bilaterally, no discharge on the right, no discharge on the left and no Horner-Trantas dots present. PERRL bilaterally. EOMI without pain. No photophobia.  Ears: Right TM pearly gray with normal light reflex, Left TM pearly gray with normal light reflex, Right TM intact without perforation and Left TM intact without perforation.  Nose/Throat: External nose within normal limits and septum midline. Turbinates edematous and pale with clear discharge. Posterior oropharynx mildly erythematous without  cobblestoning in the posterior oropharynx. Tonsils 2+ without exudates.  Tongue without thrush. Neck: Supple without thyromegaly. Trachea midline. Adenopathy: no enlarged lymph nodes appreciated in the anterior cervical, occipital, axillary, epitrochlear, inguinal, or popliteal regions. Lungs: Clear to auscultation without wheezing, rhonchi or rales. No increased work of breathing. CV: Normal S1/S2. No murmurs. Capillary refill <2 seconds.  Abdomen: Nondistended, nontender. No guarding or rebound tenderness. Bowel sounds present in all fields  and hypoactive  Skin: Warm and dry, without lesions or rashes. Extremities:  No clubbing, cyanosis or edema. Neuro:   Grossly intact. No focal deficits appreciated. Responsive to questions.  Diagnostic studies:   Spirometry: results normal (FEV1: 1.22/66%, FVC: 1.81/71%, FEV1/FVC: 67%).    Spirometry consistent with mixed obstructive and restrictive disease.   Allergy Studies: none      Salvatore Marvel, MD [facility] [place] [facility] [place]

## 2018-11-04 NOTE — Patient Instructions (Addendum)
1. Mild intermittent asthma, uncomplicated - Lung testing looked fairly good today. - We will not make any changes today. - Continue with albuterol 2-4 puffs every 4-6 hours as needed  2. Seasonal and perennial allergic rhinitis - We can use your testing from 2015 to do allergy shot prescriptions, so we did not do repeat testing today. - Stop taking: Flonase - Continue with: Allegra (fexofenadine) 180mg  table once daily - Start taking: Ryvent (carbinoxamine) 6mg  tablet 3-4 times daily as needed and Dymista (fluticasone/azelastine) two sprays per nostril 1-2 times daily as needed - You can use an extra dose of the antihistamine, if needed, for breakthrough symptoms.  - Consider nasal saline rinses 1-2 times daily to remove allergens from the nasal cavities as well as help with mucous clearance (this is especially helpful to do before the nasal sprays are given) - Allergy shot consent signed today. - Make an appointment in 2 weeks for your first allergy injection.   3. Recurrent infections - You has a history of recurrent infections, in particular pneumonias and sinus infections. - We will obtain some screening labs to evaluate your immune system.  - Labs to evaluate the quantitative [facility]) aspects of your immune system: IgG/IgA/IgM - Labs to evaluate the qualitative (HOW WELL THEY WORK)  aspects of your immune system: CH50, Pneumococcal titers, Tetanus titers, Diphtheria titers - We may consider immunizations with Pneumovax and Tdap to challenge your immune system, and then obtain repeat titers in 4-6 weeks.   4. Return in about 3 months (around 02/04/2019).   Please inform us of any Emergency Department visits, hospitalizations, or changes in symptoms. Call us before going to the ED for breathing or allergy symptoms since we might be able to fit you in for a sick visit. Feel free to contact us anytime with any questions, problems, or concerns.  It was a pleasure to meet you today! You  are an absolute delight!   Websites that have reliable patient information: 1. American Academy of Asthma, Allergy, and Immunology: www.aaaai.org 2. Food Allergy Research and Education (FARE): foodallergy.org 3. Mothers of Asthmatics: http://www.asthmacommunitynetwork.org 4. American College of Allergy, Asthma, and Immunology: MonthlyElectricBill.co.uk   Make sure you are registered to vote! If you have moved or changed any of your contact information, you will need to get this updated before voting!

## 2018-11-05 ENCOUNTER — Encounter: Payer: Self-pay | Admitting: Allergy & Immunology

## 2018-11-07 ENCOUNTER — Telehealth: Payer: Self-pay

## 2018-11-07 MED FILL — LOSARTAN POTASSIUM 50 MG TA: 50 | 90 days supply | Qty: 90 | Fill #2

## 2018-11-07 MED FILL — METOPROLOL SUCCINATE ER 100: 100 | 90 days supply | Qty: 90 | Fill #2

## 2018-11-07 NOTE — Telephone Encounter (Signed)
Per insurance, trial of Astelin 137 mcg nasal solution required prior to covering 0.15%.  Okay to send new rx for 137 mcg?  Please advise.

## 2018-11-08 ENCOUNTER — Other Ambulatory Visit: Payer: Self-pay

## 2018-11-08 MED ORDER — AZELASTINE HCL 0.1 % NA SOLN: 2.0000 | Freq: Two times a day (BID) | NASAL | 12 refills | Status: DC

## 2018-11-08 MED FILL — AZELASTINE HCL 137 MCG/SPRA: 137 | 25 days supply | Qty: 30 | Fill #0

## 2018-11-08 NOTE — Addendum Note (Signed)
Addended by: Lytle Michaels A on: 11/08/2018 04:18 PM   Modules accepted: Orders

## 2018-11-08 NOTE — Telephone Encounter (Signed)
Yes that is fine. Thank you!   Lauri Purdum, MD [facility]  

## 2018-11-10 LAB — STREP PNEUMONIAE 23 SEROTYPES IGG
PNEUMO AB TYPE 23 (23F): 0.5 ug/mL — AB (ref >1.3)
PNEUMO AB TYPE 26 (6B): 3.6 ug/mL (ref >1.3)
PNEUMO AB TYPE 34 (10A): 0.4 ug/mL — AB (ref >1.3)
PNEUMO AB TYPE 4: 9.5 ug/mL (ref >1.3)
PNEUMO AB TYPE 5: 2.3 ug/mL (ref >1.3)
PNEUMO AB TYPE 8: 1.3 ug/mL — AB (ref >1.3)
Pneumo Ab Type 1*: 5.8 ug/mL (ref >1.3)
Pneumo Ab Type 12 (12F)*: 0.4 ug/mL — ABNORMAL LOW (ref >1.3)
Pneumo Ab Type 14*: >17.4 ug/mL (ref >1.3)
Pneumo Ab Type 17 (17F)*: 0.8 ug/mL — ABNORMAL LOW (ref >1.3)
Pneumo Ab Type 19 (19F)*: 6.1 ug/mL (ref >1.3)
Pneumo Ab Type 2*: 20.2 ug/mL (ref >1.3)
Pneumo Ab Type 20*: 5.7 ug/mL (ref >1.3)
Pneumo Ab Type 22 (22F)*: 0.5 ug/mL — ABNORMAL LOW (ref >1.3)
Pneumo Ab Type 3*: 1.1 ug/mL — ABNORMAL LOW (ref >1.3)
Pneumo Ab Type 43 (11A)*: 0.8 ug/mL — ABNORMAL LOW (ref >1.3)
Pneumo Ab Type 51 (7F)*: 3.3 ug/mL (ref >1.3)
Pneumo Ab Type 54 (15B)*: 5.6 ug/mL (ref >1.3)
Pneumo Ab Type 56 (18C)*: 6.3 ug/mL (ref >1.3)
Pneumo Ab Type 57 (19A)*: 9.6 ug/mL (ref >1.3)
Pneumo Ab Type 68 (9V)*: >7.6 ug/mL (ref >1.3)
Pneumo Ab Type 9 (9N)*: 1.5 ug/mL (ref >1.3)

## 2018-11-10 LAB — DIPHTHERIA / TETANUS ANTIBODY PANEL: Tetanus Ab, IgG: >7 IU/mL (ref <0.10)

## 2018-11-10 LAB — IGG, IGA, IGM
IGA/IMMUNOGLOBULIN A, SERUM: 207 mg/dL (ref 87–352)
IGG (IMMUNOGLOBIN G), SERUM: 1314 mg/dL (ref 700–1600)
IgM (Immunoglobulin M), Srm: 19 mg/dL — ABNORMAL LOW (ref 26–217)

## 2018-11-10 LAB — COMPLEMENT, TOTAL: Compl, Total (CH50): >60 U/mL (ref 42–999999)

## 2018-11-14 DIAGNOSIS — J301 Allergic rhinitis due to pollen: Secondary | ICD-10-CM | POA: Diagnosis not present

## 2018-11-14 NOTE — Progress Notes (Signed)
VIALS EXP 11-15-19

## 2018-11-14 NOTE — Progress Notes (Signed)
VIALS NOT MADE UNTIL 11-15-19 SO THIS LINE ADDENDED.

## 2018-11-15 DIAGNOSIS — J3089 Other allergic rhinitis: Secondary | ICD-10-CM | POA: Diagnosis not present

## 2018-11-15 DIAGNOSIS — M25572 Pain in left ankle and joints of left foot: Secondary | ICD-10-CM | POA: Diagnosis not present

## 2018-11-15 MED FILL — predniSONE 10 MG TABS: 10 | 12 days supply | Qty: 48 | Fill #0

## 2018-11-16 MED FILL — ACCU-CHEK GUIDE TEST STRIP: 25 days supply | Qty: 50 | Fill #0

## 2018-11-18 ENCOUNTER — Ambulatory Visit (INDEPENDENT_AMBULATORY_CARE_PROVIDER_SITE_OTHER): Payer: 59 | Admitting: *Deleted

## 2018-11-18 DIAGNOSIS — J309 Allergic rhinitis, unspecified: Secondary | ICD-10-CM | POA: Diagnosis not present

## 2018-11-18 NOTE — Progress Notes (Signed)
Immunotherapy   Patient Details  Name: [name] MRN: [id number] Date of Birth: [birth year]  11/18/2018  Patient started allergy injections.  Blue Vial, 1:100,000, Grass-Weed-Tree-Cat-Dog and CR-Mold-D-Mite and received 0.05 ml of each vial. Following schedule: B  Frequency: 1-2 x week with 48 hours in between injections while on build up. Epi-Pen: Patient does have EpiPen and Emergency Action Plan.  Patient has been instructed on proper use of EpiPen.  Consent signed and patient instructions given. Patient waited 30 minutes after injections and no local or systemic reactions.  Maree Erie 11/18/2018, 9:39 AM

## 2018-11-24 MED FILL — PANTOPRAZOLE SOD DR 40 MG T: 40 | 90 days supply | Qty: 90 | Fill #1

## 2018-11-24 MED FILL — PREMARIN 0.625 MG TABLET: 0.625 | 30 days supply | Qty: 30 | Fill #7

## 2018-11-25 ENCOUNTER — Ambulatory Visit (INDEPENDENT_AMBULATORY_CARE_PROVIDER_SITE_OTHER): Payer: 59

## 2018-11-25 DIAGNOSIS — J309 Allergic rhinitis, unspecified: Secondary | ICD-10-CM | POA: Diagnosis not present

## 2018-12-02 ENCOUNTER — Ambulatory Visit (INDEPENDENT_AMBULATORY_CARE_PROVIDER_SITE_OTHER): Payer: 59

## 2018-12-02 DIAGNOSIS — J309 Allergic rhinitis, unspecified: Secondary | ICD-10-CM

## 2018-12-13 ENCOUNTER — Ambulatory Visit (HOSPITAL_COMMUNITY)
Admission: RE | Admit: 2018-12-13 | Discharge: 2018-12-13 | Disposition: A | Discharge status: Home / Self Care | Payer: 59 | Source: Ambulatory Visit | Attending: Internal Medicine | Admitting: Internal Medicine

## 2018-12-13 ENCOUNTER — Other Ambulatory Visit (HOSPITAL_COMMUNITY): Payer: Self-pay | Admitting: Internal Medicine

## 2018-12-13 ENCOUNTER — Other Ambulatory Visit: Payer: Self-pay | Admitting: Internal Medicine

## 2018-12-13 DIAGNOSIS — Z1389 Encounter for screening for other disorder: Secondary | ICD-10-CM | POA: Diagnosis not present

## 2018-12-13 DIAGNOSIS — M25572 Pain in left ankle and joints of left foot: Secondary | ICD-10-CM | POA: Diagnosis not present

## 2018-12-13 DIAGNOSIS — I10 Essential (primary) hypertension: Secondary | ICD-10-CM | POA: Diagnosis not present

## 2018-12-13 DIAGNOSIS — R600 Localized edema: Secondary | ICD-10-CM | POA: Insufficient documentation

## 2018-12-13 DIAGNOSIS — E119 Type 2 diabetes mellitus without complications: Secondary | ICD-10-CM | POA: Diagnosis not present

## 2018-12-13 DIAGNOSIS — Z6841 Body Mass Index (BMI) 40.0 and over, adult: Secondary | ICD-10-CM | POA: Diagnosis not present

## 2018-12-13 DIAGNOSIS — K219 Gastro-esophageal reflux disease without esophagitis: Secondary | ICD-10-CM | POA: Diagnosis not present

## 2018-12-13 DIAGNOSIS — M1991 Primary osteoarthritis, unspecified site: Secondary | ICD-10-CM | POA: Diagnosis not present

## 2018-12-19 MED FILL — ACCU-CHEK GUIDE TEST STRIP: 25 days supply | Qty: 50 | Fill #1

## 2018-12-28 MED FILL — metFORMIN HCL ER 500 MG TB2: 500 | 90 days supply | Qty: 180 | Fill #1

## 2018-12-28 MED FILL — CLOBETASOL PROP 0.05% FOAM: 0.05 | 30 days supply | Qty: 100 | Fill #0

## 2019-01-02 MED FILL — PREMARIN 0.625 MG TABLET: 0.625 | 30 days supply | Qty: 30 | Fill #8

## 2019-01-02 MED FILL — JANUVIA 100 MG TABLET: 100 | 90 days supply | Qty: 90 | Fill #3

## 2019-01-02 MED FILL — glipiZIDE 5 MG TABS: 5 | 90 days supply | Qty: 90 | Fill #3

## 2019-01-13 ENCOUNTER — Ambulatory Visit (INDEPENDENT_AMBULATORY_CARE_PROVIDER_SITE_OTHER): Payer: 59

## 2019-01-13 DIAGNOSIS — J309 Allergic rhinitis, unspecified: Secondary | ICD-10-CM | POA: Diagnosis not present

## 2019-01-18 MED FILL — PROAIR HFA 90 MCG INHALER: 108 (90 BAS | 25 days supply | Qty: 9 | Fill #1

## 2019-01-18 MED FILL — ACCU-CHEK GUIDE TEST STRIP: 25 days supply | Qty: 50 | Fill #2

## 2019-01-18 MED FILL — AMLODIPINE BESYLATE 10 MG T: 10 | 90 days supply | Qty: 90 | Fill #1

## 2019-01-18 MED FILL — ALPRAZolam 0.5 MG TABS: 0.5 | 30 days supply | Qty: 60 | Fill #1

## 2019-01-20 ENCOUNTER — Encounter: Payer: Self-pay | Admitting: Allergy & Immunology

## 2019-01-20 ENCOUNTER — Ambulatory Visit (INDEPENDENT_AMBULATORY_CARE_PROVIDER_SITE_OTHER): Payer: 59 | Admitting: Allergy & Immunology

## 2019-01-20 VITALS — BP 138/70 | HR 72 | Temp 98.6°F | Resp 18

## 2019-01-20 DIAGNOSIS — J309 Allergic rhinitis, unspecified: Secondary | ICD-10-CM | POA: Diagnosis not present

## 2019-01-20 DIAGNOSIS — J302 Other seasonal allergic rhinitis: Secondary | ICD-10-CM | POA: Insufficient documentation

## 2019-01-20 DIAGNOSIS — J4520 Mild intermittent asthma, uncomplicated: Secondary | ICD-10-CM | POA: Diagnosis not present

## 2019-01-20 DIAGNOSIS — J3089 Other allergic rhinitis: Secondary | ICD-10-CM

## 2019-01-20 DIAGNOSIS — L299 Pruritus, unspecified: Secondary | ICD-10-CM | POA: Diagnosis not present

## 2019-01-20 NOTE — Patient Instructions (Addendum)
1. Mild intermittent asthma, uncomplicated - Lung testing looked even better than it did last time. - We will not make any changes today. - Continue with albuterol 2-4 puffs every 4-6 hours as needed.  2. Itching - I think this has more to do with a viral infection rather than a result of the allergy shot. - Add on an extra [place] each day to see if this helps. - Give [place] an update next week when you come in for your shot.  3. Seasonal and perennial allergic rhinitis - We can use your testing from 2015 to do allergy shot prescriptions, so we did not do repeat testing today. - Stop taking: Flonase - Continue with: Allegra (fexofenadine) 180mg  table once daily - Start taking: Ryvent (carbinoxamine) 6mg  tablet 3-4 times daily as needed and Dymista (fluticasone/azelastine) two sprays per nostril 1-2 times daily as needed - You can use an extra dose of the antihistamine, if needed, for breakthrough symptoms.  - Consider nasal saline rinses 1-2 times daily to remove allergens from the nasal cavities as well as help with mucous clearance (this is especially helpful to do before the nasal sprays are given) - Allergy shot consent signed today. - Make an appointment in 2 weeks for your first allergy injection.   4. Return in about 6 months (around 07/21/2019).   Please inform us of any Emergency Department visits, hospitalizations, or changes in symptoms. Call us before going to the ED for breathing or allergy symptoms since we might be able to fit you in for a sick visit. Feel free to contact us anytime with any questions, problems, or concerns.  It was a pleasure to see you again today! You are an absolute delight!   Websites that have reliable patient information: 1. American Academy of Asthma, Allergy, and Immunology: www.aaaai.org 2. Food Allergy Research and Education (FARE): foodallergy.org 3. Mothers of Asthmatics: http://www.asthmacommunitynetwork.org 4. American College of Allergy,  Asthma, and Immunology: MonthlyElectricBill.co.uk   Make sure you are registered to vote! If you have moved or changed any of your contact information, you will need to get this updated before voting!

## 2019-01-20 NOTE — Progress Notes (Signed)
FOLLOW UP  Date of Service/Encounter:  01/20/19   Assessment:   Mild intermittent asthma, uncomplicated  Itching - maybe secondary to an ongoing viral infection  Seasonal and perennial allergic rhinitis   Asthma Reportables:  Severity: intermittent  Risk: low Control: well controlled      Plan/Recommendations:   1. Mild intermittent asthma, uncomplicated - Lung testing looked even better than it did last time. - We will not make any changes today. - Continue with albuterol 2-4 puffs every 4-6 hours as needed.  2. Itching - I think this has more to do with a viral infection rather than a result of the allergy shot. - Add on an extra Lead each day to see if this helps. - Give [place] an update next week when you come in for your shot.  3. Seasonal and perennial allergic rhinitis - We can use your testing from 2015 to do allergy shot prescriptions, so we did not do repeat testing today. - Stop taking: Flonase - Continue with: Allegra (fexofenadine) 180mg  table once daily - Start taking: Ryvent (carbinoxamine) 6mg  tablet 3-4 times daily as needed and Dymista (fluticasone/azelastine) two sprays per nostril 1-2 times daily as needed - You can use an extra dose of the antihistamine, if needed, for breakthrough symptoms.  - Consider nasal saline rinses 1-2 times daily to remove allergens from the nasal cavities as well as help with mucous clearance (this is especially helpful to do before the nasal sprays are given) - Allergy shot consent signed today. - Make an appointment in 2 weeks for your first allergy injection.   4. Return in about 6 months (around 07/21/2019).   Subjective:   [name] is a 58 y.o. female presenting today for follow up of  Chief Complaint  Patient presents with  . Pruritis    itching started after injection on 01-13-2019. now has sore throat, some bumps on chest/face/chin, chills today. denies joint/body/neck aches.     [name] has a history of the following: Patient Active Problem List   Diagnosis Date Noted  . Mild intermittent asthma, uncomplicated 60/12/7791  . Seasonal and perennial allergic rhinitis 01/20/2019  . Itching 01/20/2019  . Diverticulosis 01/13/2018  . Intractable nausea and vomiting 12/25/2016  . Diabetes ([place]) 09/02/2016  . Asthma 09/02/2016  . MORBID OBESITY 07/02/2009  . Essential hypertension 07/02/2009  . CHEST DISCOMFORT 07/02/2009    History obtained from: chart review and patient.  [place] Primary Care Provider is Redmond School, MD.     [name] is a 58 y.o. female presenting for a sick visit.  She was last seen in November 2019 as a new patient to reestablish care.  At that time, her lung testing looked great.  We continued with albuterol 2 puffs as needed.  For her rhinitis, we stopped her Flonase and continued Allegra 1 tablet daily.  We also started RyVent 6 mg as needed and Dymista.  We also decided to start allergen immunotherapy.  Because of her history of recurrent infections, we obtain some screening labs which were normal.  Since the last visit, she has mostly done well. She reports today that she started itching around one hour after her shot last week. She reports that the itching is just on her mouth and chin as well as her upper chest. She does endorse a sore throat, but otherwise no congestion or cough. She has not been febrile. The sore throat started a couple of days ago. She remains  on the Allegra daily.   She did not like the Ryvent from the last visit. She has been tolerating injections well up until this injection last week. She does have some local irritation but otherwise she does fine.   Nely's asthma has been well controlled. She has not required rescue medication, experienced nocturnal awakenings due to lower respiratory symptoms, nor have activities of daily living been limited. She has required no Emergency Department or Urgent Care  visits for her asthma. She has required zero courses of systemic steroids for asthma exacerbations since the last visit. ACT score today is 25, indicating excellent asthma symptom control.   Otherwise, there have been no changes to her past medical history, surgical history, family history, or social history.    Review of Systems: a 14-point review of systems is pertinent for what is mentioned in HPI.  Otherwise, all other systems were negative.  Constitutional: negative other than that listed in the HPI Eyes: negative other than that listed in the HPI Ears, nose, mouth, throat, and face: negative other than that listed in the HPI Respiratory: negative other than that listed in the HPI Cardiovascular: negative other than that listed in the HPI Gastrointestinal: negative other than that listed in the HPI Genitourinary: negative other than that listed in the HPI Integument: negative other than that listed in the HPI Hematologic: negative other than that listed in the HPI Musculoskeletal: negative other than that listed in the HPI Neurological: negative other than that listed in the HPI Allergy/Immunologic: negative other than that listed in the HPI    Objective:   Blood pressure 138/70, pulse 72, temperature 98.6 F (37 C), temperature source Oral, resp. rate 18, SpO2 97 %. There is no height or weight on file to calculate BMI.   Physical Exam:  General: Alert, interactive, in no acute distress. Pleasant female.  Eyes: No conjunctival injection bilaterally, no discharge on the right, no discharge on the left and no Horner-Trantas dots present. PERRL bilaterally. EOMI without pain. No photophobia.  Ears: Right TM pearly gray with normal light reflex, Left TM pearly gray with normal light reflex, Right TM intact without perforation and Left TM intact without perforation.  Nose/Throat: External nose within normal limits and septum midline. Turbinates edematous with clear discharge.  Posterior oropharynx erythematous without cobblestoning in the posterior oropharynx. Tonsils 2+ without exudates.  Tongue without thrush. Lungs: Clear to auscultation without wheezing, rhonchi or rales. No increased work of breathing. CV: Normal S1/S2. No murmurs. Capillary refill <2 seconds.  Skin: Warm and dry, without lesions or rashes. Neuro:   Grossly intact. No focal deficits appreciated. Responsive to questions.  Diagnostic studies:   Spirometry: results abnormal (FEV1: 1.34/72%, FVC: 1.92/76%, FEV1/FVC: 69%).    Spirometry consistent with possible restrictive disease.  Allergy Studies: none      Salvatore Marvel, MD  [facility] [facility] [place]

## 2019-01-23 NOTE — Addendum Note (Signed)
Addended by: Lytle Michaels A on: 01/23/2019 01:31 PM   Modules accepted: Orders

## 2019-02-03 ENCOUNTER — Other Ambulatory Visit (HOSPITAL_COMMUNITY): Payer: Self-pay | Admitting: Internal Medicine

## 2019-02-03 DIAGNOSIS — B353 Tinea pedis: Secondary | ICD-10-CM | POA: Diagnosis not present

## 2019-02-03 DIAGNOSIS — Z0001 Encounter for general adult medical examination with abnormal findings: Secondary | ICD-10-CM | POA: Diagnosis not present

## 2019-02-03 DIAGNOSIS — M1991 Primary osteoarthritis, unspecified site: Secondary | ICD-10-CM | POA: Diagnosis not present

## 2019-02-03 DIAGNOSIS — Z1389 Encounter for screening for other disorder: Secondary | ICD-10-CM | POA: Diagnosis not present

## 2019-02-03 DIAGNOSIS — L209 Atopic dermatitis, unspecified: Secondary | ICD-10-CM | POA: Diagnosis not present

## 2019-02-03 DIAGNOSIS — T50905A Adverse effect of unspecified drugs, medicaments and biological substances, initial encounter: Secondary | ICD-10-CM | POA: Diagnosis not present

## 2019-02-03 DIAGNOSIS — R0989 Other specified symptoms and signs involving the circulatory and respiratory systems: Secondary | ICD-10-CM

## 2019-02-03 DIAGNOSIS — Z6841 Body Mass Index (BMI) 40.0 and over, adult: Secondary | ICD-10-CM | POA: Diagnosis not present

## 2019-02-03 DIAGNOSIS — E1165 Type 2 diabetes mellitus with hyperglycemia: Secondary | ICD-10-CM | POA: Diagnosis not present

## 2019-02-03 MED FILL — NYSTATIN 100,000 UNIT/GM CR: 100000 | 30 days supply | Qty: 30 | Fill #0

## 2019-02-04 MED FILL — LOSARTAN POTASSIUM 50 MG TA: 50 | 90 days supply | Qty: 90 | Fill #3

## 2019-02-04 MED FILL — METOPROLOL SUCCINATE ER 100: 100 | 90 days supply | Qty: 90 | Fill #3

## 2019-02-04 MED FILL — PREMARIN 0.625 MG TABLET: 0.625 | 30 days supply | Qty: 30 | Fill #9

## 2019-02-06 MED FILL — GLIMEPIRIDE 4 MG TABLET: 4 | 30 days supply | Qty: 30 | Fill #0

## 2019-02-08 DIAGNOSIS — Z1389 Encounter for screening for other disorder: Secondary | ICD-10-CM | POA: Diagnosis not present

## 2019-02-08 DIAGNOSIS — Z0001 Encounter for general adult medical examination with abnormal findings: Secondary | ICD-10-CM | POA: Diagnosis not present

## 2019-02-10 ENCOUNTER — Ambulatory Visit (HOSPITAL_COMMUNITY)
Admission: RE | Admit: 2019-02-10 | Discharge: 2019-02-10 | Disposition: A | Discharge status: Home / Self Care | Payer: 59 | Source: Ambulatory Visit | Attending: Internal Medicine | Admitting: Internal Medicine

## 2019-02-10 ENCOUNTER — Ambulatory Visit: Payer: 59 | Admitting: Allergy & Immunology

## 2019-02-10 DIAGNOSIS — I6523 Occlusion and stenosis of bilateral carotid arteries: Secondary | ICD-10-CM | POA: Diagnosis not present

## 2019-02-10 DIAGNOSIS — R0989 Other specified symptoms and signs involving the circulatory and respiratory systems: Secondary | ICD-10-CM | POA: Diagnosis not present

## 2019-02-20 MED FILL — PANTOPRAZOLE SOD DR 40 MG T: 40 | 90 days supply | Qty: 90 | Fill #2

## 2019-02-20 MED FILL — ACCU-CHEK GUIDE TEST STRIP: 25 days supply | Qty: 50 | Fill #3 | Status: TO

## 2019-02-20 MED FILL — ALPRAZolam 0.5 MG TABS: 0.5 | 30 days supply | Qty: 60 | Fill #2

## 2019-02-28 DIAGNOSIS — L309 Dermatitis, unspecified: Secondary | ICD-10-CM | POA: Diagnosis not present

## 2019-02-28 MED FILL — MINOCYCLINE 50 MG CAPSULE: 50 | 30 days supply | Qty: 60 | Fill #0

## 2019-03-04 MED FILL — PREMARIN 0.625 MG TABLET: 0.625 | 30 days supply | Qty: 30 | Fill #10 | Status: TO

## 2019-03-25 MED FILL — metFORMIN HCL ER 500 MG TB2: 500 | 90 days supply | Qty: 180 | Fill #0

## 2019-03-25 MED FILL — ACCU-CHEK GUIDE TEST STRIP: 25 days supply | Qty: 50 | Fill #0

## 2019-03-31 MED FILL — PREMARIN 0.625 MG TABLET: 0.625 | 30 days supply | Qty: 30 | Fill #0

## 2019-03-31 MED FILL — JANUVIA 100 MG TABLET: 100 | 90 days supply | Qty: 90 | Fill #0

## 2019-04-10 MED FILL — ALPRAZolam 0.5 MG TABS: 0.5 | 30 days supply | Qty: 60 | Fill #0

## 2019-04-11 DIAGNOSIS — Z6841 Body Mass Index (BMI) 40.0 and over, adult: Secondary | ICD-10-CM | POA: Diagnosis not present

## 2019-04-11 DIAGNOSIS — K219 Gastro-esophageal reflux disease without esophagitis: Secondary | ICD-10-CM | POA: Diagnosis not present

## 2019-04-11 DIAGNOSIS — I10 Essential (primary) hypertension: Secondary | ICD-10-CM | POA: Diagnosis not present

## 2019-04-11 DIAGNOSIS — T50905A Adverse effect of unspecified drugs, medicaments and biological substances, initial encounter: Secondary | ICD-10-CM | POA: Diagnosis not present

## 2019-04-11 DIAGNOSIS — Z1389 Encounter for screening for other disorder: Secondary | ICD-10-CM | POA: Diagnosis not present

## 2019-04-11 DIAGNOSIS — E119 Type 2 diabetes mellitus without complications: Secondary | ICD-10-CM | POA: Diagnosis not present

## 2019-04-11 MED FILL — glipiZIDE 5 MG TABS: 5 | 90 days supply | Qty: 90 | Fill #0

## 2019-04-13 MED FILL — ACCU-CHEK GUIDE TEST STRIP: 25 days supply | Qty: 50 | Fill #1 | Status: TO

## 2019-04-19 MED FILL — AZELASTINE HCL 137 MCG SPRY: 0.1 | 25 days supply | Qty: 30 | Fill #0

## 2019-04-19 MED FILL — FLUTICASONE PROP 50 MCG SPR: 50 | 30 days supply | Qty: 16 | Fill #0

## 2019-04-26 MED FILL — PREMARIN 0.625 MG TABLET: 0.625 | 30 days supply | Qty: 30 | Fill #0

## 2019-05-04 DIAGNOSIS — M545 Low back pain: Secondary | ICD-10-CM | POA: Diagnosis not present

## 2019-05-04 DIAGNOSIS — Z6841 Body Mass Index (BMI) 40.0 and over, adult: Secondary | ICD-10-CM | POA: Diagnosis not present

## 2019-05-04 DIAGNOSIS — R252 Cramp and spasm: Secondary | ICD-10-CM | POA: Diagnosis not present

## 2019-05-04 MED FILL — CONTRAVE ER 8-90 MG TABLET: 8-90 | 34 days supply | Qty: 60 | Fill #0

## 2019-05-05 ENCOUNTER — Other Ambulatory Visit: Payer: Self-pay

## 2019-05-05 ENCOUNTER — Ambulatory Visit (HOSPITAL_COMMUNITY)
Admission: RE | Admit: 2019-05-05 | Discharge: 2019-05-05 | Disposition: A | Discharge status: Home / Self Care | Payer: 59 | Source: Ambulatory Visit | Attending: Internal Medicine | Admitting: Internal Medicine

## 2019-05-05 ENCOUNTER — Other Ambulatory Visit (HOSPITAL_COMMUNITY): Payer: Self-pay | Admitting: Internal Medicine

## 2019-05-05 DIAGNOSIS — S3992XA Unspecified injury of lower back, initial encounter: Secondary | ICD-10-CM | POA: Diagnosis not present

## 2019-05-05 DIAGNOSIS — M5136 Other intervertebral disc degeneration, lumbar region: Secondary | ICD-10-CM | POA: Diagnosis not present

## 2019-05-08 MED FILL — ALPRAZolam 0.5 MG TABS: 0.5 | 30 days supply | Qty: 60 | Fill #1

## 2019-05-09 MED FILL — METOPROLOL SUCCINATE ER 100: 100 | 90 days supply | Qty: 90 | Fill #0

## 2019-05-09 MED FILL — LOSARTAN POTASSIUM 50 MG TA: 50 | 30 days supply | Qty: 30 | Fill #0

## 2019-05-09 MED FILL — AMLODIPINE BESYLATE 10 MG T: 10 | 90 days supply | Qty: 90 | Fill #2

## 2019-05-09 MED FILL — AZELASTINE HCL 137 MCG SPRY: 0.1 | 25 days supply | Qty: 30 | Fill #1

## 2019-05-11 ENCOUNTER — Other Ambulatory Visit: Payer: Self-pay | Admitting: Internal Medicine

## 2019-05-11 DIAGNOSIS — Z1231 Encounter for screening mammogram for malignant neoplasm of breast: Secondary | ICD-10-CM

## 2019-05-13 MED FILL — FLUTICASONE PROP 50 MCG SPR: 50 | 30 days supply | Qty: 16 | Fill #1

## 2019-05-20 MED FILL — PREMARIN 0.625 MG TABLET: 0.625 | 30 days supply | Qty: 30 | Fill #1

## 2019-05-23 MED FILL — ACCU-CHEK GUIDE TEST STRIP: 25 days supply | Qty: 50 | Fill #0

## 2019-05-23 MED FILL — PANTOPRAZOLE SOD DR 40 MG T: 40 | 90 days supply | Qty: 90 | Fill #3

## 2019-06-01 MED FILL — CONTRAVE ER 8-90 MG TABLET: 8-90 | 30 days supply | Qty: 60 | Fill #1

## 2019-06-02 MED FILL — AZELASTINE HCL 137 MCG SPRY: 0.1 | 25 days supply | Qty: 30 | Fill #2

## 2019-06-12 MED FILL — LOSARTAN POTASSIUM 50 MG TA: 50 | 30 days supply | Qty: 30 | Fill #1

## 2019-06-12 MED FILL — ACCU-CHEK GUIDE TEST STRIP: 25 days supply | Qty: 50 | Fill #1

## 2019-06-14 MED FILL — ALPRAZolam 0.5 MG TABS: 0.5 | 30 days supply | Qty: 60 | Fill #0

## 2019-06-20 MED FILL — METFORMIN HCL ER 500 MG TB2: 500 | 30 days supply | Qty: 60 | Fill #1

## 2019-06-23 MED FILL — metFORMIN HCL ER 500 MG TB2: 500 | 90 days supply | Qty: 180 | Fill #0

## 2019-06-23 MED FILL — PREMARIN 0.625 MG TABLET: 0.625 | 30 days supply | Qty: 30 | Fill #0

## 2019-06-23 MED FILL — JANUVIA 100 MG TABLET: 100 | 90 days supply | Qty: 90 | Fill #1

## 2019-06-30 ENCOUNTER — Other Ambulatory Visit: Payer: Self-pay

## 2019-06-30 ENCOUNTER — Ambulatory Visit
Admission: RE | Admit: 2019-06-30 | Discharge: 2019-06-30 | Disposition: A | Discharge status: Home / Self Care | Payer: 59 | Source: Ambulatory Visit | Attending: Internal Medicine | Admitting: Internal Medicine

## 2019-06-30 DIAGNOSIS — Z1231 Encounter for screening mammogram for malignant neoplasm of breast: Secondary | ICD-10-CM

## 2019-06-30 MED FILL — FLUTICASONE PROP 50 MCG SPR: 50 | 30 days supply | Qty: 16 | Fill #0

## 2019-07-03 DIAGNOSIS — M79645 Pain in left finger(s): Secondary | ICD-10-CM | POA: Diagnosis not present

## 2019-07-03 MED FILL — ACCU-CHEK GUIDE TEST STRIP: 25 days supply | Qty: 50 | Fill #2

## 2019-07-04 MED FILL — JANUVIA 100 MG TABLET: 100 | 90 days supply | Qty: 90 | Fill #1

## 2019-07-05 DIAGNOSIS — R109 Unspecified abdominal pain: Secondary | ICD-10-CM | POA: Diagnosis not present

## 2019-07-05 DIAGNOSIS — F419 Anxiety disorder, unspecified: Secondary | ICD-10-CM | POA: Diagnosis not present

## 2019-07-05 DIAGNOSIS — N390 Urinary tract infection, site not specified: Secondary | ICD-10-CM | POA: Diagnosis not present

## 2019-07-05 DIAGNOSIS — Z681 Body mass index (BMI) 19 or less, adult: Secondary | ICD-10-CM | POA: Diagnosis not present

## 2019-07-05 MED FILL — JANUVIA 100 MG TABLET: 100 | 90 days supply | Qty: 90 | Fill #0

## 2019-07-05 MED FILL — glipiZIDE 5 MG TABS: 5 | 90 days supply | Qty: 90 | Fill #0

## 2019-07-06 DIAGNOSIS — R109 Unspecified abdominal pain: Secondary | ICD-10-CM | POA: Diagnosis not present

## 2019-07-14 DIAGNOSIS — E119 Type 2 diabetes mellitus without complications: Secondary | ICD-10-CM | POA: Diagnosis not present

## 2019-07-18 MED FILL — ALPRAZolam 0.5 MG TABS: 0.5 | 30 days supply | Qty: 60 | Fill #0

## 2019-07-18 MED FILL — LOSARTAN POTASSIUM 50 MG TA: 50 | 30 days supply | Qty: 30 | Fill #2

## 2019-07-20 ENCOUNTER — Ambulatory Visit: Payer: 59 | Admitting: Sports Medicine

## 2019-07-20 ENCOUNTER — Other Ambulatory Visit: Payer: Self-pay

## 2019-07-20 ENCOUNTER — Encounter: Payer: Self-pay | Admitting: Sports Medicine

## 2019-07-20 VITALS — BP 157/69 | Ht 63.0 in | Wt 246.0 lb

## 2019-07-20 DIAGNOSIS — G5601 Carpal tunnel syndrome, right upper limb: Secondary | ICD-10-CM

## 2019-07-20 MED ORDER — GABAPENTIN 300 MG PO CAPS: 300.0000 mg | ORAL_CAPSULE | Freq: Every day | ORAL | 1 refills | Status: DC

## 2019-07-20 MED FILL — GABAPENTIN 300 MG CAPSULE: 300 | 30 days supply | Qty: 30 | Fill #0

## 2019-07-20 NOTE — Progress Notes (Signed)
PCP: Redmond School, MD  Subjective:   HPI: Patient is a 58 y.o. female here for right wrist and hand numbness and tingling. It started gradually in past  3-4 weeks. 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  She denies any erythema or major swelling of her hand.  She has had numbness and tingling mostly on first 3 digits. She denies weakness of hand, how ever, she can not hold pencil very well. Her main complaint is numbness and tingling of her fingers although now she has some wrist and finger pain at night. She saw an orthopedic surgeon for this issue recently and was given a hand brace and recommended to do NCV. She decided not to do that and mentions "I do not want to do surgery yet".  Brace does not help a lot with her symptoms. (She uses the brace at night. Has not brought it to clinic with herself). Denies shoulder or neck pain. No other new joint pain. No fever, chills, nausea, vomiting abdominal pain, dysuria, chest pain, shortness of breath. She has remote Hx of left CTS s/p surgery around 20 years ago and mentions that her left hand is doing well after that.  She came to our clinic for second opinion regarding her right hand nubmness.   Past Medical History:  Diagnosis Date  . Anxiety   . Asthma    diagnosed as an adult  . Diabetes mellitus   . Diverticulosis 01/13/2018  . Hypertension     Current Outpatient Medications on File Prior to Visit  Medication Sig Dispense Refill  . acetaminophen (TYLENOL) 500 MG tablet Take 1,000 mg by mouth at bedtime as needed. For pain    . albuterol (PROVENTIL HFA;VENTOLIN HFA) 108 (90 Base) MCG/ACT inhaler Inhale 1-2 puffs into the lungs every 6 (six) hours as needed for wheezing or shortness of breath.    . ALPRAZolam (XANAX) 0.5 MG tablet Take 0.5 mg by mouth 2 (two) times daily as needed for anxiety.     Marland Kitchen amLODipine (NORVASC) 10 MG tablet Take 10 mg by mouth daily.     Marland Kitchen aspirin EC 81 MG tablet Take 81 mg by mouth daily.    Marland Kitchen azelastine (ASTELIN) 0.1 % nasal spray Place 2 sprays into both nostrils 2 (two) times daily. Use in each nostril as directed 30 mL 12  . EPINEPHrine 0.3 mg/0.3 mL IJ SOAJ injection   11  . estrogens, conjugated, (PREMARIN) 0.625 MG tablet Take 0.625 mg by mouth daily.     . fexofenadine (ALLEGRA) 180 MG tablet Take by mouth.    . fluticasone (FLONASE) 50 MCG/ACT nasal spray Place 2 sprays into both nostrils 2 (two) times daily as needed for allergies or rhinitis. 16 g 3  . glipiZIDE (GLUCOTROL) 5 MG tablet   3  . levocetirizine (XYZAL) 5 MG tablet Take 5 mg by mouth daily.    Marland Kitchen losartan (COZAAR) 25 MG tablet Take 50 mg by mouth daily.     . metFORMIN (GLUCOPHAGE) 500 MG tablet Take 500 mg by mouth 2 (two) times daily.  3  . metoprolol succinate (TOPROL-XL) 50 MG 24 hr tablet Take 2 tablets by mouth daily.   11  . montelukast (SINGULAIR) 10 MG tablet Take 10 mg by mouth at bedtime.    . pantoprazole (PROTONIX) 40 MG tablet Take by mouth.    . ranitidine (ZANTAC) 150 MG tablet Take 150 mg  by mouth daily.    . sitaGLIPtin (JANUVIA) 100 MG tablet Take 100 mg by mouth daily.     No current facility-administered medications on file prior to visit.     Past Surgical History:  Procedure Laterality Date  . ABDOMINAL HYSTERECTOMY    . CARPAL TUNNEL RELEASE    . CESAREAN SECTION    . CHOLECYSTECTOMY    . SINUS EXPLORATION    . TRIGGER FINGER RELEASE    . WRIST SURGERY      Allergies  Allergen Reactions  . Barium-Containing Compounds Itching and Other (See Comments)    Weakness, loss of voice  . Statins Other (See Comments)    Myalgia, muscle weakness  . Sulfa Antibiotics Itching and Other (See Comments)    Weakness, loss of voice  . Victoza [Liraglutide] Diarrhea and Nausea Only    Social History   Socioeconomic History  . Marital status: Divorced    Spouse name: Not on file  . Number of children: Not on file  . Years  of education: Not on file  . Highest education level: Not on file  Occupational History  . Not on file  Social Needs  . Financial resource strain: Not on file  . Food insecurity    Worry: Not on file    Inability: Not on file  . Transportation needs    Medical: Not on file    Non-medical: Not on file  Tobacco Use  . Smoking status: Former Smoker    Types: Cigarettes    Quit date: 01/20/1989    Years since quitting: 30.5  . Smokeless tobacco: Never Used  Substance and Sexual Activity  . Alcohol use: No    Alcohol/week: 0.0 standard drinks  . Drug use: No  . Sexual activity: Yes    Birth control/protection: Surgical  Lifestyle  . Physical activity    Days per week: Not on file    Minutes per session: Not on file  . Stress: Not on file  Relationships  . Social Herbalist on phone: Not on file    Gets together: Not on file    Attends religious service: Not on file    Active member of club or organization: Not on file    Attends meetings of clubs or organizations: Not on file    Relationship status: Not on file  . Intimate partner violence    Fear of current or ex partner: Not on file    Emotionally abused: Not on file    Physically abused: Not on file    Forced sexual activity: Not on file  Other Topics Concern  . Not on file  Social History Narrative  . Not on file    Family History  Problem Relation Age of Onset  . Hypertension Mother   . Diabetes Mother   . Stroke Mother     BP (!) 157/69   Ht 5\' 3"  (1.6 m)   Wt 246 lb (111.6 kg)   BMI 43.58 kg/m   Review of Systems: See HPI above.     Objective:  Physical Exam:  Gen: NAD, comfortable in exam room CV: RRR, Nl S1S2, (radial) pulses are palpable and normal bilateraly  Lungs: CTA bilaterally, no crackle Musculoskeletal:   Right upper extremity:  Rt shoulder no deformity, no tenderness, nl ROM, Motor strength is  5/5  Right Wrist: FROM No swelling, no erythema, no thenar atrophy No  tenderness to palpation Tinel and Phalen test are positive Sensation  is intact Motor strength is 5/5 (Median, ulnar and radial nerves motor function is full)  Left wrist: surgical scar at anterior wrist Thenar atrophy is present Sensation is normal and motor is 5/5 Tinel and Phalen test are negtive    Assessment & Plan:  1. Right CTS 2.  Status post remote left carpal tunnel release  Patient presented with 3-4 weeks Hx of tingling and numbness of first 3 digits of right hand. With Hx of overuse (working with computer) and PMHx of left CTS.  Her clinical presentation and exam including poitive phalen and Tinel test, are highly suggestive of right side carpal tunnel syndrome. No evidence of radiculopathy.  No motor deficit and no atrophy present. Will do conservative and symptoms management. We do not recommend NCV at this point as her symptoms is going on only for 3-4 weeks and NCV can be falsely negative.  Her main complaint is numbness and tingling.  Prescribing Gabapentin and recommending to continue using brace (only at night).  So can take PRN Tylenol as needed. She can not take NSAID.  -Gabapentin 300 mg QD -Tylenol PRN -Continue using brace at night -F/u in clinic in 3-4 weeks or sooner as needed  Patient seen and evaluated with the resident.  I agree with the above plan of care.  We are going to try 300 mg of gabapentin at night.  She will continue with nighttime splinting.  I will also order some physical therapy.  Patient will follow-up in 3 to 4 weeks for reevaluation.  If symptoms persist, then we could consider merits of nerve conduction study at that time.

## 2019-07-21 ENCOUNTER — Telehealth: Payer: Self-pay | Admitting: Internal Medicine

## 2019-07-21 NOTE — Telephone Encounter (Signed)
Error

## 2019-07-26 DIAGNOSIS — G5601 Carpal tunnel syndrome, right upper limb: Secondary | ICD-10-CM | POA: Diagnosis not present

## 2019-07-26 DIAGNOSIS — M25641 Stiffness of right hand, not elsewhere classified: Secondary | ICD-10-CM | POA: Diagnosis not present

## 2019-07-26 DIAGNOSIS — M79641 Pain in right hand: Secondary | ICD-10-CM | POA: Diagnosis not present

## 2019-07-26 DIAGNOSIS — M6281 Muscle weakness (generalized): Secondary | ICD-10-CM | POA: Diagnosis not present

## 2019-07-26 DIAGNOSIS — R202 Paresthesia of skin: Secondary | ICD-10-CM | POA: Diagnosis not present

## 2019-07-31 DIAGNOSIS — G5601 Carpal tunnel syndrome, right upper limb: Secondary | ICD-10-CM | POA: Diagnosis not present

## 2019-07-31 DIAGNOSIS — M79641 Pain in right hand: Secondary | ICD-10-CM | POA: Diagnosis not present

## 2019-07-31 DIAGNOSIS — M6281 Muscle weakness (generalized): Secondary | ICD-10-CM | POA: Diagnosis not present

## 2019-07-31 DIAGNOSIS — M25641 Stiffness of right hand, not elsewhere classified: Secondary | ICD-10-CM | POA: Diagnosis not present

## 2019-07-31 DIAGNOSIS — R202 Paresthesia of skin: Secondary | ICD-10-CM | POA: Diagnosis not present

## 2019-08-01 MED FILL — PREMARIN 0.625 MG TABLET: 0.625 | 30 days supply | Qty: 30 | Fill #1

## 2019-08-01 MED FILL — ALBUTEROL SULFATE HFA 108 (: 108 (90 BAS | 25 days supply | Qty: 9 | Fill #0

## 2019-08-02 DIAGNOSIS — R202 Paresthesia of skin: Secondary | ICD-10-CM | POA: Diagnosis not present

## 2019-08-02 DIAGNOSIS — M25641 Stiffness of right hand, not elsewhere classified: Secondary | ICD-10-CM | POA: Diagnosis not present

## 2019-08-02 DIAGNOSIS — G5601 Carpal tunnel syndrome, right upper limb: Secondary | ICD-10-CM | POA: Diagnosis not present

## 2019-08-02 DIAGNOSIS — M79641 Pain in right hand: Secondary | ICD-10-CM | POA: Diagnosis not present

## 2019-08-02 DIAGNOSIS — M6281 Muscle weakness (generalized): Secondary | ICD-10-CM | POA: Diagnosis not present

## 2019-08-08 MED FILL — METOPROLOL SUCCINATE ER 100: 100 | 90 days supply | Qty: 90 | Fill #1

## 2019-08-08 MED FILL — ACCU-CHEK GUIDE TEST STRIP: 25 days supply | Qty: 50 | Fill #3

## 2019-08-08 MED FILL — AMLODIPINE BESYLATE 10 MG T: 10 | 90 days supply | Qty: 90 | Fill #0

## 2019-08-09 DIAGNOSIS — M25641 Stiffness of right hand, not elsewhere classified: Secondary | ICD-10-CM | POA: Diagnosis not present

## 2019-08-09 DIAGNOSIS — M6281 Muscle weakness (generalized): Secondary | ICD-10-CM | POA: Diagnosis not present

## 2019-08-09 DIAGNOSIS — R202 Paresthesia of skin: Secondary | ICD-10-CM | POA: Diagnosis not present

## 2019-08-09 DIAGNOSIS — G5601 Carpal tunnel syndrome, right upper limb: Secondary | ICD-10-CM | POA: Diagnosis not present

## 2019-08-09 DIAGNOSIS — M79641 Pain in right hand: Secondary | ICD-10-CM | POA: Diagnosis not present

## 2019-08-10 ENCOUNTER — Ambulatory Visit: Payer: 59 | Admitting: Sports Medicine

## 2019-08-10 ENCOUNTER — Other Ambulatory Visit: Payer: Self-pay

## 2019-08-10 VITALS — BP 146/76 | Wt 246.0 lb

## 2019-08-10 DIAGNOSIS — G5601 Carpal tunnel syndrome, right upper limb: Secondary | ICD-10-CM

## 2019-08-10 MED ORDER — GABAPENTIN 100 MG PO CAPS: ORAL_CAPSULE | ORAL | 0 refills | Status: DC

## 2019-08-10 MED FILL — GABAPENTIN 100 MG CAPSULE: 100 | 20 days supply | Qty: 60 | Fill #0

## 2019-08-10 NOTE — Progress Notes (Deleted)
PCP: Redmond School, MD  Subjective:   HPI: Patient is a 58 y.o. female here for .  ***  Past Medical History:  Diagnosis Date  . Anxiety   . Asthma    diagnosed as an adult  . Diabetes mellitus   . Diverticulosis 01/13/2018  . Hypertension     Current Outpatient Medications on File Prior to Visit  Medication Sig Dispense Refill  . acetaminophen (TYLENOL) 500 MG tablet Take 1,000 mg by mouth at bedtime as needed. For pain    . albuterol (PROVENTIL HFA;VENTOLIN HFA) 108 (90 Base) MCG/ACT inhaler Inhale 1-2 puffs into the lungs every 6 (six) hours as needed for wheezing or shortness of breath.    . ALPRAZolam (XANAX) 0.5 MG tablet Take 0.5 mg by mouth 2 (two) times daily as needed for anxiety.     Marland Kitchen amLODipine (NORVASC) 10 MG tablet Take 10 mg by mouth daily.    Marland Kitchen aspirin EC 81 MG tablet Take 81 mg by mouth daily.    Marland Kitchen azelastine (ASTELIN) 0.1 % nasal spray Place 2 sprays into both nostrils 2 (two) times daily. Use in each nostril as directed 30 mL 12  . EPINEPHrine 0.3 mg/0.3 mL IJ SOAJ injection   11  . estrogens, conjugated, (PREMARIN) 0.625 MG tablet Take 0.625 mg by mouth daily.     . fexofenadine (ALLEGRA) 180 MG tablet Take by mouth.    . fluticasone (FLONASE) 50 MCG/ACT nasal spray Place 2 sprays into both nostrils 2 (two) times daily as needed for allergies or rhinitis. 16 g 3  . gabapentin (NEURONTIN) 300 MG capsule Take 1 capsule (300 mg total) by mouth at bedtime. 30 capsule 1  . glipiZIDE (GLUCOTROL) 5 MG tablet   3  . levocetirizine (XYZAL) 5 MG tablet Take 5 mg by mouth daily.    Marland Kitchen losartan (COZAAR) 25 MG tablet Take 50 mg by mouth daily.     . metFORMIN (GLUCOPHAGE) 500 MG tablet Take 500 mg by mouth 2 (two) times daily.  3  . metoprolol succinate (TOPROL-XL) 50 MG 24 hr tablet Take 2 tablets by mouth daily.   11  . montelukast (SINGULAIR) 10 MG tablet Take 10 mg by mouth at bedtime.    . pantoprazole (PROTONIX) 40 MG tablet Take by mouth.    . ranitidine (ZANTAC)  150 MG tablet Take 150 mg by mouth daily.    . sitaGLIPtin (JANUVIA) 100 MG tablet Take 100 mg by mouth daily.     No current facility-administered medications on file prior to visit.     Past Surgical History:  Procedure Laterality Date  . ABDOMINAL HYSTERECTOMY    . CARPAL TUNNEL RELEASE    . CESAREAN SECTION    . CHOLECYSTECTOMY    . SINUS EXPLORATION    . TRIGGER FINGER RELEASE    . WRIST SURGERY      Allergies  Allergen Reactions  . Barium-Containing Compounds Itching and Other (See Comments)    Weakness, loss of voice  . Statins Other (See Comments)    Myalgia, muscle weakness  . Sulfa Antibiotics Itching and Other (See Comments)    Weakness, loss of voice  . Victoza [Liraglutide] Diarrhea and Nausea Only    Social History   Socioeconomic History  . Marital status: Divorced    Spouse name: Not on file  . Number of children: Not on file  . Years of education: Not on file  . Highest education level: Not on file  Occupational History  .  Not on file  Social Needs  . Financial resource strain: Not on file  . Food insecurity    Worry: Not on file    Inability: Not on file  . Transportation needs    Medical: Not on file    Non-medical: Not on file  Tobacco Use  . Smoking status: Former Smoker    Types: Cigarettes    Quit date: 01/20/1989    Years since quitting: 30.5  . Smokeless tobacco: Never Used  Substance and Sexual Activity  . Alcohol use: No    Alcohol/week: 0.0 standard drinks  . Drug use: No  . Sexual activity: Yes    Birth control/protection: Surgical  Lifestyle  . Physical activity    Days per week: Not on file    Minutes per session: Not on file  . Stress: Not on file  Relationships  . Social Herbalist on phone: Not on file    Gets together: Not on file    Attends religious service: Not on file    Active member of club or organization: Not on file    Attends meetings of clubs or organizations: Not on file    Relationship  status: Not on file  . Intimate partner violence    Fear of current or ex partner: Not on file    Emotionally abused: Not on file    Physically abused: Not on file    Forced sexual activity: Not on file  Other Topics Concern  . Not on file  Social History Narrative  . Not on file    Family History  Problem Relation Age of Onset  . Hypertension Mother   . Diabetes Mother   . Stroke Mother     BP (!) 146/76   Wt 246 lb (111.6 kg)   BMI 43.58 kg/m   Review of Systems: See HPI above.     Objective:  Physical Exam:  Gen: NAD, comfortable in exam room  ***   Assessment & Plan:  1. ***

## 2019-08-10 NOTE — Progress Notes (Signed)
   Subjective:    Patient ID: [name], female    DOB: 10-16-1961, 58 y.o.   MRN: [id number]  HPI   [name] comes in today for follow-up on right hand numbness.  She has had some limited improvement with physical therapy.  They have designed a volar custom brace which is been quite helpful.  Although she is having pain in the hand, numbness is her main complaint.  Is primarily along the radial aspect of the hand.  She denies weakness.  She has a history of a remote left wrist carpal tunnel release done by Dr. Percell Miller several years ago.  She also has a history of ulnar styloid shortening/excision in the right hand done in 1979.  She does endorse some numbness at night when sleeping on her right side but the custom brace has been quite beneficial.  She is also taking 300 mg of gabapentin prior to bedtime but this is making her drowsy during the day.    Review of Systems As above    Objective:   Physical Exam  Well-developed, well-nourished.  No acute distress.  Awake alert and oriented x3.  Vital signs reviewed.  Right hand: Full wrist range of motion.  No effusion.  No soft tissue swelling.  No atrophy.  Positive Tinel's over the carpal tunnel.  Negative Phalen's.  Good pulses.  Sensation is decreased to light touch along the thenar eminence.      Assessment & Plan:   Right hand numbness likely secondary to carpal tunnel syndrome-rule out cervical radiculopathy  EMG/nerve conduction study to confirm carpal tunnel syndrome and rule out referred pain from cervical radiculopathy.  She will continue with formal therapy for now.  She will continue with her custom brace.  We will decrease her gabapentin to 100 mg nightly.  She may increase this to 200 mg if needed.  Phone follow-up with EMG/nerve conduction study results when available.  We will delineate further treatment based on those findings.

## 2019-08-11 ENCOUNTER — Encounter: Payer: Self-pay | Admitting: Neurology

## 2019-08-11 ENCOUNTER — Other Ambulatory Visit: Payer: Self-pay

## 2019-08-11 DIAGNOSIS — G5601 Carpal tunnel syndrome, right upper limb: Secondary | ICD-10-CM

## 2019-08-14 DIAGNOSIS — R202 Paresthesia of skin: Secondary | ICD-10-CM | POA: Diagnosis not present

## 2019-08-14 DIAGNOSIS — M6281 Muscle weakness (generalized): Secondary | ICD-10-CM | POA: Diagnosis not present

## 2019-08-14 DIAGNOSIS — M25641 Stiffness of right hand, not elsewhere classified: Secondary | ICD-10-CM | POA: Diagnosis not present

## 2019-08-14 DIAGNOSIS — M79641 Pain in right hand: Secondary | ICD-10-CM | POA: Diagnosis not present

## 2019-08-14 DIAGNOSIS — G5601 Carpal tunnel syndrome, right upper limb: Secondary | ICD-10-CM | POA: Diagnosis not present

## 2019-08-16 ENCOUNTER — Other Ambulatory Visit: Payer: Self-pay | Admitting: *Deleted

## 2019-08-16 DIAGNOSIS — G5601 Carpal tunnel syndrome, right upper limb: Secondary | ICD-10-CM

## 2019-08-17 DIAGNOSIS — E669 Obesity, unspecified: Secondary | ICD-10-CM | POA: Diagnosis not present

## 2019-08-17 DIAGNOSIS — Z681 Body mass index (BMI) 19 or less, adult: Secondary | ICD-10-CM | POA: Diagnosis not present

## 2019-08-17 DIAGNOSIS — E119 Type 2 diabetes mellitus without complications: Secondary | ICD-10-CM | POA: Diagnosis not present

## 2019-08-17 DIAGNOSIS — J329 Chronic sinusitis, unspecified: Secondary | ICD-10-CM | POA: Diagnosis not present

## 2019-08-22 MED FILL — PANTOPRAZOLE SOD DR 40 MG T: 40 | 90 days supply | Qty: 90 | Fill #0

## 2019-08-22 MED FILL — LOSARTAN POTASSIUM 50 MG TA: 50 | 30 days supply | Qty: 30 | Fill #3

## 2019-09-04 MED FILL — ACCU-CHEK GUIDE TEST STRIP: 25 days supply | Qty: 50 | Fill #4

## 2019-09-04 MED FILL — PREMARIN 0.625 MG TABLET: 0.625 | 30 days supply | Qty: 30 | Fill #2

## 2019-09-04 MED FILL — ALPRAZolam 0.5 MG TABS: 0.5 | 30 days supply | Qty: 60 | Fill #1

## 2019-09-06 MED FILL — metFORMIN HCL ER 500 MG TB2: 500 | 90 days supply | Qty: 180 | Fill #0

## 2019-09-21 ENCOUNTER — Encounter: Payer: 59 | Admitting: Neurology

## 2019-09-25 MED FILL — LOSARTAN POTASSIUM 50 MG TA: 50 | 30 days supply | Qty: 30 | Fill #4

## 2019-09-25 MED FILL — ACCU-CHEK GUIDE TEST STRIP: 25 days supply | Qty: 50 | Fill #5

## 2019-10-11 DIAGNOSIS — G5601 Carpal tunnel syndrome, right upper limb: Secondary | ICD-10-CM | POA: Diagnosis not present

## 2019-10-11 DIAGNOSIS — M9907 Segmental and somatic dysfunction of upper extremity: Secondary | ICD-10-CM | POA: Diagnosis not present

## 2019-10-11 DIAGNOSIS — M9902 Segmental and somatic dysfunction of thoracic region: Secondary | ICD-10-CM | POA: Diagnosis not present

## 2019-10-11 DIAGNOSIS — M9901 Segmental and somatic dysfunction of cervical region: Secondary | ICD-10-CM | POA: Diagnosis not present

## 2019-10-11 DIAGNOSIS — M542 Cervicalgia: Secondary | ICD-10-CM | POA: Diagnosis not present

## 2019-10-11 DIAGNOSIS — M546 Pain in thoracic spine: Secondary | ICD-10-CM | POA: Diagnosis not present

## 2019-10-12 ENCOUNTER — Ambulatory Visit (INDEPENDENT_AMBULATORY_CARE_PROVIDER_SITE_OTHER): Payer: 59 | Admitting: Otolaryngology

## 2019-10-12 DIAGNOSIS — H6123 Impacted cerumen, bilateral: Secondary | ICD-10-CM | POA: Diagnosis not present

## 2019-10-12 DIAGNOSIS — H9011 Conductive hearing loss, unilateral, right ear, with unrestricted hearing on the contralateral side: Secondary | ICD-10-CM

## 2019-10-12 DIAGNOSIS — R0982 Postnasal drip: Secondary | ICD-10-CM | POA: Diagnosis not present

## 2019-10-12 DIAGNOSIS — H6983 Other specified disorders of Eustachian tube, bilateral: Secondary | ICD-10-CM

## 2019-10-13 ENCOUNTER — Other Ambulatory Visit: Payer: Self-pay | Admitting: Allergy & Immunology

## 2019-10-13 DIAGNOSIS — G5601 Carpal tunnel syndrome, right upper limb: Secondary | ICD-10-CM | POA: Diagnosis not present

## 2019-10-13 DIAGNOSIS — M542 Cervicalgia: Secondary | ICD-10-CM | POA: Diagnosis not present

## 2019-10-13 DIAGNOSIS — M9901 Segmental and somatic dysfunction of cervical region: Secondary | ICD-10-CM | POA: Diagnosis not present

## 2019-10-13 DIAGNOSIS — M9907 Segmental and somatic dysfunction of upper extremity: Secondary | ICD-10-CM | POA: Diagnosis not present

## 2019-10-13 DIAGNOSIS — M9902 Segmental and somatic dysfunction of thoracic region: Secondary | ICD-10-CM | POA: Diagnosis not present

## 2019-10-13 DIAGNOSIS — M546 Pain in thoracic spine: Secondary | ICD-10-CM | POA: Diagnosis not present

## 2019-10-13 MED FILL — ALPRAZolam 0.5 MG TABS: 0.5 | 30 days supply | Qty: 60 | Fill #2

## 2019-10-13 MED FILL — PREMARIN 0.625 MG TABLET: 0.625 | 30 days supply | Qty: 30 | Fill #3

## 2019-10-13 MED FILL — FLUTICASONE PROP 50 MCG SPR: 50 | 30 days supply | Qty: 16 | Fill #0

## 2019-10-13 MED FILL — JANUVIA 100 MG TABLET: 100 | 90 days supply | Qty: 90 | Fill #1

## 2019-10-13 MED FILL — glipiZIDE 5 MG TABS: 5 | 90 days supply | Qty: 90 | Fill #1

## 2019-10-13 NOTE — Telephone Encounter (Signed)
Gave 1 courtesy refill of Flonase. Pt needs ov.

## 2019-10-16 DIAGNOSIS — G5601 Carpal tunnel syndrome, right upper limb: Secondary | ICD-10-CM | POA: Diagnosis not present

## 2019-10-16 DIAGNOSIS — M542 Cervicalgia: Secondary | ICD-10-CM | POA: Diagnosis not present

## 2019-10-16 DIAGNOSIS — M546 Pain in thoracic spine: Secondary | ICD-10-CM | POA: Diagnosis not present

## 2019-10-16 DIAGNOSIS — M9902 Segmental and somatic dysfunction of thoracic region: Secondary | ICD-10-CM | POA: Diagnosis not present

## 2019-10-16 DIAGNOSIS — M9907 Segmental and somatic dysfunction of upper extremity: Secondary | ICD-10-CM | POA: Diagnosis not present

## 2019-10-16 DIAGNOSIS — M9901 Segmental and somatic dysfunction of cervical region: Secondary | ICD-10-CM | POA: Diagnosis not present

## 2019-10-16 MED FILL — ACCU-CHEK GUIDE TEST STRIP: 25 days supply | Qty: 50 | Fill #0

## 2019-10-18 DIAGNOSIS — M9901 Segmental and somatic dysfunction of cervical region: Secondary | ICD-10-CM | POA: Diagnosis not present

## 2019-10-18 DIAGNOSIS — M542 Cervicalgia: Secondary | ICD-10-CM | POA: Diagnosis not present

## 2019-10-18 DIAGNOSIS — M9907 Segmental and somatic dysfunction of upper extremity: Secondary | ICD-10-CM | POA: Diagnosis not present

## 2019-10-18 DIAGNOSIS — G5601 Carpal tunnel syndrome, right upper limb: Secondary | ICD-10-CM | POA: Diagnosis not present

## 2019-10-18 DIAGNOSIS — M9902 Segmental and somatic dysfunction of thoracic region: Secondary | ICD-10-CM | POA: Diagnosis not present

## 2019-10-18 DIAGNOSIS — M546 Pain in thoracic spine: Secondary | ICD-10-CM | POA: Diagnosis not present

## 2019-10-20 MED FILL — ALBUTEROL SULFATE HFA 108 (: 108 (90 BAS | 25 days supply | Qty: 9 | Fill #1

## 2019-10-23 DIAGNOSIS — M542 Cervicalgia: Secondary | ICD-10-CM | POA: Diagnosis not present

## 2019-10-23 DIAGNOSIS — M9901 Segmental and somatic dysfunction of cervical region: Secondary | ICD-10-CM | POA: Diagnosis not present

## 2019-10-23 DIAGNOSIS — M546 Pain in thoracic spine: Secondary | ICD-10-CM | POA: Diagnosis not present

## 2019-10-23 DIAGNOSIS — G5601 Carpal tunnel syndrome, right upper limb: Secondary | ICD-10-CM | POA: Diagnosis not present

## 2019-10-23 DIAGNOSIS — M9902 Segmental and somatic dysfunction of thoracic region: Secondary | ICD-10-CM | POA: Diagnosis not present

## 2019-10-23 DIAGNOSIS — M9907 Segmental and somatic dysfunction of upper extremity: Secondary | ICD-10-CM | POA: Diagnosis not present

## 2019-10-23 MED FILL — LOSARTAN POTASSIUM 50 MG TA: 50 | 30 days supply | Qty: 30 | Fill #5

## 2019-10-27 DIAGNOSIS — E119 Type 2 diabetes mellitus without complications: Secondary | ICD-10-CM | POA: Diagnosis not present

## 2019-11-01 DIAGNOSIS — M542 Cervicalgia: Secondary | ICD-10-CM | POA: Diagnosis not present

## 2019-11-01 DIAGNOSIS — M9907 Segmental and somatic dysfunction of upper extremity: Secondary | ICD-10-CM | POA: Diagnosis not present

## 2019-11-01 DIAGNOSIS — M9901 Segmental and somatic dysfunction of cervical region: Secondary | ICD-10-CM | POA: Diagnosis not present

## 2019-11-01 DIAGNOSIS — M9902 Segmental and somatic dysfunction of thoracic region: Secondary | ICD-10-CM | POA: Diagnosis not present

## 2019-11-01 DIAGNOSIS — M546 Pain in thoracic spine: Secondary | ICD-10-CM | POA: Diagnosis not present

## 2019-11-01 DIAGNOSIS — G5601 Carpal tunnel syndrome, right upper limb: Secondary | ICD-10-CM | POA: Diagnosis not present

## 2019-11-06 DIAGNOSIS — M546 Pain in thoracic spine: Secondary | ICD-10-CM | POA: Diagnosis not present

## 2019-11-06 DIAGNOSIS — G5601 Carpal tunnel syndrome, right upper limb: Secondary | ICD-10-CM | POA: Diagnosis not present

## 2019-11-06 DIAGNOSIS — M542 Cervicalgia: Secondary | ICD-10-CM | POA: Diagnosis not present

## 2019-11-06 DIAGNOSIS — M9901 Segmental and somatic dysfunction of cervical region: Secondary | ICD-10-CM | POA: Diagnosis not present

## 2019-11-06 DIAGNOSIS — M9907 Segmental and somatic dysfunction of upper extremity: Secondary | ICD-10-CM | POA: Diagnosis not present

## 2019-11-06 DIAGNOSIS — M9902 Segmental and somatic dysfunction of thoracic region: Secondary | ICD-10-CM | POA: Diagnosis not present

## 2019-11-08 DIAGNOSIS — J329 Chronic sinusitis, unspecified: Secondary | ICD-10-CM | POA: Diagnosis not present

## 2019-11-08 DIAGNOSIS — Z681 Body mass index (BMI) 19 or less, adult: Secondary | ICD-10-CM | POA: Diagnosis not present

## 2019-11-08 MED FILL — AZITHROMYCIN 250 MG TABLET: 250 | 5 days supply | Qty: 6 | Fill #0

## 2019-11-08 MED FILL — METHYLPREDNISOLONE 4 MG TAB: 4 | 6 days supply | Qty: 21 | Fill #0

## 2019-11-16 MED FILL — ALPRAZolam 0.5 MG TABS: 0.5 | 30 days supply | Qty: 60 | Fill #0

## 2019-11-27 MED FILL — PANTOPRAZOLE SOD DR 40 MG T: 40 | 90 days supply | Qty: 90 | Fill #1

## 2019-11-27 MED FILL — LOSARTAN POTASSIUM 50 MG TA: 50 | 30 days supply | Qty: 30 | Fill #6

## 2019-11-28 MED FILL — ACCU-CHEK GUIDE TEST STRIP: 25 days supply | Qty: 50 | Fill #1

## 2019-11-28 MED FILL — METFORMIN HCL ER 500 MG TB2: 500 | 90 days supply | Qty: 180 | Fill #1

## 2019-11-29 MED FILL — levoFLOXacin 500 MG TABS: 500 | 10 days supply | Qty: 10 | Fill #0

## 2019-12-06 DIAGNOSIS — M9907 Segmental and somatic dysfunction of upper extremity: Secondary | ICD-10-CM | POA: Diagnosis not present

## 2019-12-06 DIAGNOSIS — M542 Cervicalgia: Secondary | ICD-10-CM | POA: Diagnosis not present

## 2019-12-06 DIAGNOSIS — G5601 Carpal tunnel syndrome, right upper limb: Secondary | ICD-10-CM | POA: Diagnosis not present

## 2019-12-06 DIAGNOSIS — M9901 Segmental and somatic dysfunction of cervical region: Secondary | ICD-10-CM | POA: Diagnosis not present

## 2019-12-06 DIAGNOSIS — M9902 Segmental and somatic dysfunction of thoracic region: Secondary | ICD-10-CM | POA: Diagnosis not present

## 2019-12-06 DIAGNOSIS — M546 Pain in thoracic spine: Secondary | ICD-10-CM | POA: Diagnosis not present

## 2019-12-11 DIAGNOSIS — M9907 Segmental and somatic dysfunction of upper extremity: Secondary | ICD-10-CM | POA: Diagnosis not present

## 2019-12-11 DIAGNOSIS — M542 Cervicalgia: Secondary | ICD-10-CM | POA: Diagnosis not present

## 2019-12-11 DIAGNOSIS — M9901 Segmental and somatic dysfunction of cervical region: Secondary | ICD-10-CM | POA: Diagnosis not present

## 2019-12-11 DIAGNOSIS — M546 Pain in thoracic spine: Secondary | ICD-10-CM | POA: Diagnosis not present

## 2019-12-11 DIAGNOSIS — G5601 Carpal tunnel syndrome, right upper limb: Secondary | ICD-10-CM | POA: Diagnosis not present

## 2019-12-11 DIAGNOSIS — M9902 Segmental and somatic dysfunction of thoracic region: Secondary | ICD-10-CM | POA: Diagnosis not present

## 2019-12-13 DIAGNOSIS — E119 Type 2 diabetes mellitus without complications: Secondary | ICD-10-CM | POA: Diagnosis not present

## 2019-12-13 DIAGNOSIS — Z6841 Body Mass Index (BMI) 40.0 and over, adult: Secondary | ICD-10-CM | POA: Diagnosis not present

## 2019-12-13 DIAGNOSIS — R109 Unspecified abdominal pain: Secondary | ICD-10-CM | POA: Diagnosis not present

## 2019-12-13 DIAGNOSIS — K5732 Diverticulitis of large intestine without perforation or abscess without bleeding: Secondary | ICD-10-CM | POA: Diagnosis not present

## 2019-12-13 DIAGNOSIS — Z23 Encounter for immunization: Secondary | ICD-10-CM | POA: Diagnosis not present

## 2019-12-13 DIAGNOSIS — K219 Gastro-esophageal reflux disease without esophagitis: Secondary | ICD-10-CM | POA: Diagnosis not present

## 2019-12-25 ENCOUNTER — Other Ambulatory Visit: Payer: Self-pay

## 2019-12-25 ENCOUNTER — Encounter (HOSPITAL_COMMUNITY): Payer: Self-pay | Admitting: Emergency Medicine

## 2019-12-25 ENCOUNTER — Emergency Department (HOSPITAL_COMMUNITY)
Admission: EM | Admit: 2019-12-25 | Discharge: 2019-12-25 | Disposition: A | Discharge status: Home / Self Care | Payer: 59 | Attending: Emergency Medicine | Admitting: Emergency Medicine

## 2019-12-25 ENCOUNTER — Emergency Department (HOSPITAL_COMMUNITY): Payer: 59

## 2019-12-25 DIAGNOSIS — E119 Type 2 diabetes mellitus without complications: Secondary | ICD-10-CM | POA: Diagnosis not present

## 2019-12-25 DIAGNOSIS — I10 Essential (primary) hypertension: Secondary | ICD-10-CM | POA: Insufficient documentation

## 2019-12-25 DIAGNOSIS — R109 Unspecified abdominal pain: Secondary | ICD-10-CM | POA: Insufficient documentation

## 2019-12-25 DIAGNOSIS — Z7984 Long term (current) use of oral hypoglycemic drugs: Secondary | ICD-10-CM | POA: Insufficient documentation

## 2019-12-25 DIAGNOSIS — Z79899 Other long term (current) drug therapy: Secondary | ICD-10-CM | POA: Diagnosis not present

## 2019-12-25 DIAGNOSIS — J45909 Unspecified asthma, uncomplicated: Secondary | ICD-10-CM | POA: Insufficient documentation

## 2019-12-25 DIAGNOSIS — R1030 Lower abdominal pain, unspecified: Secondary | ICD-10-CM | POA: Diagnosis not present

## 2019-12-25 DIAGNOSIS — Z7982 Long term (current) use of aspirin: Secondary | ICD-10-CM | POA: Diagnosis not present

## 2019-12-25 LAB — URINALYSIS, ROUTINE W REFLEX MICROSCOPIC
Bilirubin Urine: NEGATIVE
Glucose, UA: NEGATIVE mg/dL
Hgb urine dipstick: NEGATIVE
Ketones, ur: NEGATIVE mg/dL
Leukocytes,Ua: NEGATIVE
Nitrite: NEGATIVE
Protein, ur: 30 mg/dL — AB
Specific Gravity, Urine: 1.013 (ref 1.005–1.030)
pH: 5 (ref 5.0–8.0)

## 2019-12-25 LAB — CBC WITH DIFFERENTIAL/PLATELET
Abs Immature Granulocytes: 0.03 10*3/uL (ref 0.00–0.07)
Basophils Absolute: 0 10*3/uL (ref 0.0–0.1)
Basophils Relative: 0 %
Eosinophils Absolute: 0 10*3/uL (ref 0.0–0.5)
Eosinophils Relative: 0 %
HCT: 40.4 % (ref 36.0–46.0)
Hemoglobin: 13.3 g/dL (ref 12.0–15.0)
Immature Granulocytes: 1 %
Lymphocytes Relative: 22 %
Lymphs Abs: 1.2 10*3/uL (ref 0.7–4.0)
MCH: 29.9 pg (ref 26.0–34.0)
MCHC: 32.9 g/dL (ref 30.0–36.0)
MCV: 90.8 fL (ref 80.0–100.0)
Monocytes Absolute: 0.6 10*3/uL (ref 0.1–1.0)
Monocytes Relative: 12 %
Neutro Abs: 3.5 10*3/uL (ref 1.7–7.7)
Neutrophils Relative %: 65 %
Platelets: 215 10*3/uL (ref 150–400)
RBC: 4.45 MIL/uL (ref 3.87–5.11)
RDW: 13.2 % (ref 11.5–15.5)
WBC: 5.4 10*3/uL (ref 4.0–10.5)
nRBC: 0 % (ref 0.0–0.2)

## 2019-12-25 LAB — COMPREHENSIVE METABOLIC PANEL
ALT: 32 U/L (ref 0–44)
AST: 37 U/L (ref 15–41)
Albumin: 3.9 g/dL (ref 3.5–5.0)
Alkaline Phosphatase: 43 U/L (ref 38–126)
Anion gap: 10 (ref 5–15)
BUN: 8 mg/dL (ref 6–20)
CO2: 25 mmol/L (ref 22–32)
Calcium: 8.6 mg/dL — ABNORMAL LOW (ref 8.9–10.3)
Chloride: 103 mmol/L (ref 98–111)
Creatinine, Ser: 0.49 mg/dL (ref 0.44–1.00)
GFR calc Af Amer: >60 mL/min (ref >60)
GFR calc non Af Amer: >60 mL/min (ref >60)
Glucose, Bld: 106 mg/dL — ABNORMAL HIGH (ref 70–99)
Potassium: 3.1 mmol/L — ABNORMAL LOW (ref 3.5–5.1)
Sodium: 138 mmol/L (ref 135–145)
Total Bilirubin: 0.4 mg/dL (ref 0.3–1.2)
Total Protein: 7.6 g/dL (ref 6.5–8.1)

## 2019-12-25 LAB — LIPASE, BLOOD: Lipase: 18 U/L (ref 11–51)

## 2019-12-25 LAB — CBG MONITORING, ED: Glucose-Capillary: 86 mg/dL (ref 70–99)

## 2019-12-25 MED ORDER — ONDANSETRON 4 MG PO TBDP
4.0000 mg | ORAL_TABLET | Freq: Once | ORAL | Status: AC
  Administered 2019-12-25: 4 mg via ORAL
  Filled 2019-12-25: qty 1

## 2019-12-25 MED ORDER — ONDANSETRON HCL 4 MG PO TABS: 4.0000 mg | ORAL_TABLET | Freq: Four times a day (QID) | ORAL | 0 refills | Status: DC | PRN

## 2019-12-25 MED ORDER — ALUM & MAG HYDROXIDE-SIMETH 200-200-20 MG/5ML PO SUSP
30.0000 mL | Freq: Once | ORAL | Status: AC
  Administered 2019-12-25: 30 mL via ORAL
  Filled 2019-12-25: qty 30

## 2019-12-25 MED ORDER — POTASSIUM CHLORIDE ER 10 MEQ PO TBCR: 10.0000 meq | EXTENDED_RELEASE_TABLET | Freq: Every day | ORAL | 0 refills | Status: DC

## 2019-12-25 MED ORDER — MORPHINE SULFATE (PF) 4 MG/ML IV SOLN
4.0000 mg | Freq: Once | INTRAVENOUS | Status: AC
  Administered 2019-12-25: 4 mg via INTRAVENOUS
  Filled 2019-12-25: qty 1

## 2019-12-25 MED ORDER — KETOROLAC TROMETHAMINE 30 MG/ML IJ SOLN
30.0000 mg | Freq: Once | INTRAMUSCULAR | Status: AC
  Administered 2019-12-25: 30 mg via INTRAVENOUS
  Filled 2019-12-25: qty 1

## 2019-12-25 MED ORDER — LIDOCAINE 5 % EX PTCH
1.0000 | MEDICATED_PATCH | Freq: Once | CUTANEOUS | Status: DC
  Administered 2019-12-25: 1 via TRANSDERMAL
  Filled 2019-12-25: qty 1

## 2019-12-25 MED ORDER — ONDANSETRON 4 MG PO TBDP: 4.0000 mg | ORAL_TABLET | Freq: Three times a day (TID) | ORAL | 0 refills | Status: DC | PRN

## 2019-12-25 MED FILL — PREMARIN 0.625 MG TABLET: 0.625 | 30 days supply | Qty: 30 | Fill #0

## 2019-12-25 MED FILL — LOSARTAN POTASSIUM 50 MG TA: 50 | 30 days supply | Qty: 30 | Fill #7

## 2019-12-25 MED FILL — ALPRAZolam 0.5 MG TABS: 0.5 | 30 days supply | Qty: 60 | Fill #1

## 2019-12-25 MED FILL — ALBUTEROL SULFATE HFA 108 (: 108 (90 BAS | 25 days supply | Qty: 9 | Fill #2

## 2019-12-25 NOTE — ED Provider Notes (Signed)
Medical screening examination/treatment/procedure(s) were conducted as a shared visit with non-physician practitioner(s) and myself.  I personally evaluated the patient during the encounter.  Clinical Impression:   Final diagnoses:  Abdominal pain, unspecified abdominal location     Presenting with left flank and left abdominal pain.  Has focal tenderness on my exam without guarding, no right-sided tenderness at all.  Prior history of cholecystectomy.  Lungs and heart are clear.  Reviewed labs and CT scan are all unremarkable, patient is feeling better, rates pain less than 5 out of 10 at this time.  She was given reassurance and the indications for return and is accepting of these instructions.   Noemi Chapel, MD 12/25/19 [id number]

## 2019-12-25 NOTE — ED Notes (Signed)
Tolerating fluids well.   

## 2019-12-25 NOTE — ED Provider Notes (Signed)
[facility] EMERGENCY DEPARTMENT Provider Note   CSN: [id number] Arrival date & time: 12/25/19  1245     History Chief Complaint  Patient presents with  . Flank Pain    [name] is a 59 y.o. female has medical history significant for asthma, diabetes, diverticulosis, hypertension, anxiety presenting to emergency department today with chief complaint of sudden onset left flank pain.  This started 3 hours prior to arrival and has progressively worsened.  She describes the pain as sharp.  Pain radiates from her left flank to her left upper abdomen.  She rates the pain 9 out of 10 in severity.  She is also endorsing associated nausea without emesis.  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 However secondary to pain and nausea she has been unable to take any of her home medications or pain medication prior to arrival.  She does have urinary frequency but states this is not unusual for her as she drinks a lot of water. Denies any fever, chills, chest pain, shortness of breath, back pain, gross hematuria, dysuria, pelvic pain, vaginal discharge, diarrhea. Abdominal surgical history includes cholecystectomy, complete hysterectomy, cesarean sections.   Past Medical History:  Diagnosis Date  . Anxiety   . Asthma    diagnosed as an adult  . Diabetes mellitus   . Diverticulosis 01/13/2018  . Hypertension     Patient Active Problem List   Diagnosis Date Noted  . Mild intermittent asthma, uncomplicated 123456  . Seasonal and perennial allergic rhinitis 01/20/2019  . Itching 01/20/2019  . Diverticulosis 01/13/2018  . Intractable nausea and vomiting 12/25/2016  . Diabetes ([place]) 09/02/2016  . Asthma 09/02/2016  . MORBID OBESITY 07/02/2009  . Essential hypertension 07/02/2009  . CHEST DISCOMFORT 07/02/2009    Past Surgical History:  Procedure Laterality Date  . ABDOMINAL HYSTERECTOMY    . CARPAL TUNNEL RELEASE    .  CESAREAN SECTION    . CHOLECYSTECTOMY    . SINUS EXPLORATION    . TRIGGER FINGER RELEASE    . WRIST SURGERY       OB History   No obstetric history on file.     Family History  Problem Relation Age of Onset  . Hypertension Mother   . Diabetes Mother   . Stroke Mother     Social History   Tobacco Use  . Smoking status: Former Smoker    Types: Cigarettes    Quit date: 01/20/1989    Years since quitting: 30.9  . Smokeless tobacco: Never Used  Substance Use Topics  . Alcohol use: No    Alcohol/week: 0.0 standard drinks  . Drug use: No    Home Medications Prior to Admission medications   Medication Sig Start Date End Date Taking? Authorizing Provider  acetaminophen (TYLENOL) 500 MG tablet Take 1,000 mg by mouth at bedtime as needed. For pain    [provider]  albuterol (PROVENTIL HFA;VENTOLIN HFA) 108 (90 Base) MCG/ACT inhaler Inhale 1-2 puffs into the lungs every 6 (six) hours as needed for wheezing or shortness of breath.    [provider]  ALPRAZolam Duanne Moron) 0.5 MG tablet Take 0.5 mg by mouth 2 (two) times daily as needed for anxiety.     [provider]  amLODipine (NORVASC) 10 MG tablet Take 10 mg by mouth daily.    [provider]  aspirin EC 81 MG tablet Take 81 mg by mouth daily.    [provider]  azelastine (ASTELIN) 0.1 % nasal spray Place 2 sprays into both nostrils 2 (two) times daily. Use in each nostril as directed 11/08/18   Valentina Shaggy, MD  EPINEPHrine 0.3 mg/0.3 mL IJ SOAJ injection  10/12/18   [provider]  estrogens, conjugated, (PREMARIN) 0.625 MG tablet Take 0.625 mg by mouth daily.     [provider]  fexofenadine (ALLEGRA) 180 MG tablet Take by mouth.    [provider]  fluticasone (FLONASE) 50 MCG/ACT nasal spray USE 2 SPRAYS INTO BOTH NOSTRILS 2 TIMES DAILY AS NEEDED FOR ALLERGIES OR RHINITIS. 10/13/19   Valentina Shaggy, MD  gabapentin (NEURONTIN) 100 MG  capsule Take 1-3 caps qhs prn 08/10/19   Lilia Argue R, DO  gabapentin (NEURONTIN) 300 MG capsule Take 1 capsule (300 mg total) by mouth at bedtime. 07/20/19   Thurman Coyer, DO  glipiZIDE (GLUCOTROL) 5 MG tablet  10/13/18   [provider]  levocetirizine (XYZAL) 5 MG tablet Take 5 mg by mouth daily.    [provider]  losartan (COZAAR) 25 MG tablet Take 50 mg by mouth daily.     [provider]  metFORMIN (GLUCOPHAGE) 500 MG tablet Take 500 mg by mouth 2 (two) times daily. 01/14/16   [provider]  metoprolol succinate (TOPROL-XL) 50 MG 24 hr tablet Take 2 tablets by mouth daily.  05/26/16   [provider]  montelukast (SINGULAIR) 10 MG tablet Take 10 mg by mouth at bedtime.    [provider]  ondansetron (ZOFRAN ODT) 4 MG disintegrating tablet Take 1 tablet (4 mg total) by mouth every 8 (eight) hours as needed for nausea or vomiting. 12/25/19   Isla Sabree, Verline Lema E, PA-C  pantoprazole (PROTONIX) 40 MG tablet Take by mouth. 06/29/16   [provider]  potassium chloride (KLOR-CON) 10 MEQ tablet Take 1 tablet (10 mEq total) by mouth daily for 4 days. 12/25/19 12/29/19  Olivia Pavelko E, PA-C  ranitidine (ZANTAC) 150 MG tablet Take 150 mg by mouth daily.    [provider]  sitaGLIPtin (JANUVIA) 100 MG tablet Take 100 mg by mouth daily.    [provider]    Allergies    Barium-containing compounds, Statins, Sulfa antibiotics, and Victoza [liraglutide]  Review of Systems   Review of Systems  All other systems are reviewed and are negative for acute change except as noted in the HPI.   Physical Exam Updated Vital Signs BP (!) 190/78 (BP Location: Right Arm) Comment: too N to take meds  Pulse 93   Temp 98.4 F (36.9 C) (Oral)   Resp 18   Ht 5\' 2"  (1.575 m)   Wt 111.6 kg   SpO2 98%   BMI 45.00 kg/m   Physical Exam Vitals and nursing note reviewed.  Constitutional:      General: She is not in  acute distress.    Appearance: She is not ill-appearing.  HENT:     Head: Normocephalic and atraumatic.     Right Ear: Tympanic membrane and external ear normal.     Left Ear: Tympanic membrane and external ear normal.     Nose: Nose normal.     Mouth/Throat:     Mouth: Mucous membranes are moist.     Pharynx: Oropharynx is clear.  Eyes:     General: No scleral icterus.       Right eye: No discharge.        Left eye: No discharge.  Extraocular Movements: Extraocular movements intact.     Conjunctiva/sclera: Conjunctivae normal.     Pupils: Pupils are equal, round, and reactive to light.  Neck:     Vascular: No JVD.  Cardiovascular:     Rate and Rhythm: Normal rate and regular rhythm.     Pulses: Normal pulses.          Radial pulses are 2+ on the right side and 2+ on the left side.     Heart sounds: Normal heart sounds.  Pulmonary:     Comments: Lungs clear to auscultation in all fields. Symmetric chest rise. No wheezing, rales, or rhonchi. Abdominal:     General: Bowel sounds are normal.     Tenderness: There is no right CVA tenderness.     Comments: Abdomen is soft, non-distended. She has tenderness to left upper quadrant. No rigidity, no guarding. No peritoneal signs. Mild left sided CVA tenderness.   Musculoskeletal:        General: Normal range of motion.     Cervical back: Normal range of motion.  Skin:    General: Skin is warm and dry.     Capillary Refill: Capillary refill takes less than 2 seconds.  Neurological:     Mental Status: She is oriented to person, place, and time.     GCS: GCS eye subscore is 4. GCS verbal subscore is 5. GCS motor subscore is 6.     Comments: Fluent speech, no facial droop.  Psychiatric:        Behavior: Behavior normal.     ED Results / Procedures / Treatments   Labs (all labs ordered are listed, but only abnormal results are displayed) Labs Reviewed  COMPREHENSIVE METABOLIC PANEL - Abnormal; Notable for the following  components:      Result Value   Potassium 3.1 (*)    Glucose, Bld 106 (*)    Calcium 8.6 (*)    All other components within normal limits  URINALYSIS, ROUTINE W REFLEX MICROSCOPIC - Abnormal; Notable for the following components:   Protein, ur 30 (*)    Bacteria, UA RARE (*)    All other components within normal limits  URINE CULTURE  LIPASE, BLOOD  CBC WITH DIFFERENTIAL/PLATELET  CBG MONITORING, ED    EKG None  Radiology CT Renal Stone Study  Result Date: 12/25/2019 CLINICAL DATA:  Left flank pain EXAM: CT ABDOMEN AND PELVIS WITHOUT CONTRAST TECHNIQUE: Multidetector CT imaging of the abdomen and pelvis was performed following the standard protocol without IV contrast. COMPARISON:  01/02/2018 FINDINGS: Lower chest: Mild bibasilar atelectasis. Hepatobiliary: No focal liver abnormality is seen. Status post cholecystectomy. No biliary dilatation. Pancreas: Unremarkable. Spleen: Unremarkable. Adrenals/Urinary Tract: Adrenals are normal in appearance. There are bilateral renal cysts. No renal calculi hydronephrosis. No ureteral calculi identified. Partially distended bladder is unremarkable. Stomach/Bowel: Small hiatal hernia. Stomach is unremarkable. Colonic diverticulosis. Normal appendix. Vascular/Lymphatic: Mild aortic atherosclerosis. No enlarged abdominal or pelvic lymph nodes. Reproductive: Status post hysterectomy. No adnexal masses. Other: No abdominal wall or ascites. Musculoskeletal: Degenerative changes of the spine including significant lumbar facet degeneration. There is transitional anatomy at the lumbosacral junction. IMPRESSION: No findings to account for reported symptoms. Stable chronic findings detailed above. Electronically Signed   By: Macy Mis M.D.   On: 12/25/2019 13:59    Procedures Procedures (including critical care time)  Medications Ordered in ED Medications  lidocaine (LIDODERM) 5 % 1 patch (1 patch Transdermal Patch Applied 12/25/19 1510)  ondansetron  (ZOFRAN-ODT) disintegrating tablet 4  mg (4 mg Oral Given 12/25/19 1310)  morphine 4 MG/ML injection 4 mg (4 mg Intravenous Given 12/25/19 1330)  ketorolac (TORADOL) 30 MG/ML injection 30 mg (30 mg Intravenous Given 12/25/19 1424)  alum & mag hydroxide-simeth (MAALOX/MYLANTA) 200-200-20 MG/5ML suspension 30 mL (30 mLs Oral Given 12/25/19 1423)    ED Course  I have reviewed the triage vital signs and the nursing notes.  Pertinent labs & imaging results that were available during my care of the patient were reviewed by me and considered in my medical decision making (see chart for details).  Clinical Course as of Dec 24 1512  Mon Dec 25, 2019  1339 No leukocytosis, no anemia  CBC with Differential [KA]  1339 CBG 86. No hyperglycemia  CBG monitoring, ED [KA]  1408 Mild hypokalemia 3.1, normal anion gap  Comprehensive metabolic panel(!) [KA]    Clinical Course User Index [KA] Lynne Righi, Harley Hallmark, PA-C   MDM Rules/Calculators/A&P  Patient seen and examined. Patient presents awake, alert, hemodynamically stable, afebrile, non toxic but does appear uncomfortable.  She has tenderness to palpation of left upper quadrant, left flank and left CVA tenderness on exam.  Normoactive bowel sounds.  DDx includes kidney stone, diverticulitis, gastritis, ulcer, appendicitis, less likely to be dissection. Given her stable vitals. CBC shows no leukocytosis, no anemia. CMP shows mild hypokalemia 3.1, mild hypocalcemia of 8.6.  Normal anion gap. Lipase within normal range. UA is without blood or obvious infection, no RBC or WBC, rare bacteria.CT renal without signs of kidney stone or acute findings.  Discussed results with patient.  Pain is improved after morphine however still present at 7 out of 10 in severity.  Will try Toradol and GI cocktail and reassess.  Reassessment patient reports pain is improved, patient is tolerating p.o. intake.  Engaged in shared decision making and patient feels that she can manage  symptoms at home and is requesting to be discharged.  Will discharge home with symptomatic care including Zofran.  We will also send prescription for kdur given her mild hypokalemia today.  The patient appears reasonably screened and/or stabilized for discharge and I doubt any other medical condition or other [facility] requiring further screening, evaluation, or treatment in the ED at this time prior to discharge. The patient is safe for discharge with strict return precautions discussed. Recommend pcp follow up to have symptoms rechecked and after finishing kdur to have potassium rechecked. Findings and plan of care discussed with supervising physician Dr. Sabra Heck.   Portions of this note were generated with [employer]. Dictation errors may occur despite best attempts at proofreading.     Final Clinical Impression(s) / ED Diagnoses Final diagnoses:  Abdominal pain, unspecified abdominal location    Rx / DC Orders ED Discharge Orders         Ordered    potassium chloride (KLOR-CON) 10 MEQ tablet  Daily     12/25/19 1510    ondansetron (ZOFRAN) 4 MG tablet  Every 6 hours PRN,   Status:  Discontinued     12/25/19 1510    ondansetron (ZOFRAN ODT) 4 MG disintegrating tablet  Every 8 hours PRN     12/25/19 1511           Zerah Hilyer, Erie Noe 12/25/19 1515    Noemi Chapel, MD 12/25/19 1754

## 2019-12-25 NOTE — Discharge Instructions (Addendum)
You have been seen today for abdominal pain. Please read and follow all provided instructions. Return to the emergency room for worsening condition or new concerning symptoms.    1. Medications:  -Prescription to pharmacy for potassium pills as your potassium level was low today.  Please take these as prescribed. -Prescription also sent for Zofran.  This is used to treat nausea.  Please take as prescribed. Continue usual home medications  Take medications as prescribed. Please review all of the medicines and only take them if you do not have an allergy to them.   2. Treatment: rest, drink plenty of fluids.    3. Follow Up:  Please follow up with primary care provider by scheduling an appointment as soon as possible for a visit for symptom recheck. -Please also have your potassium level rechecked by primary care provider after taking all of the the potassium pills.  It is also a possibility that you have an allergic reaction to any of the medicines that you have been prescribed - Everybody reacts differently to medications and while MOST people have no trouble with most medicines, you may have a reaction such as nausea, vomiting, rash, swelling, shortness of breath. If this is the case, please stop taking the medicine immediately and contact your physician.  ?

## 2019-12-25 NOTE — ED Triage Notes (Signed)
LLQ/flank pain for the last 3 hours  Sudden onset

## 2019-12-25 NOTE — ED Notes (Signed)
Provided diet ginger ale

## 2019-12-26 LAB — URINE CULTURE: Culture: NO GROWTH

## 2019-12-28 ENCOUNTER — Telehealth (HOSPITAL_COMMUNITY): Payer: Self-pay | Admitting: Physician Assistant

## 2019-12-28 ENCOUNTER — Emergency Department (HOSPITAL_COMMUNITY)
Admission: EM | Admit: 2019-12-28 | Discharge: 2019-12-28 | Disposition: A | Discharge status: Home / Self Care | Payer: 59 | Attending: Emergency Medicine | Admitting: Emergency Medicine

## 2019-12-28 ENCOUNTER — Emergency Department (HOSPITAL_COMMUNITY): Payer: 59

## 2019-12-28 ENCOUNTER — Other Ambulatory Visit: Payer: Self-pay

## 2019-12-28 ENCOUNTER — Encounter (HOSPITAL_COMMUNITY): Payer: Self-pay

## 2019-12-28 DIAGNOSIS — J45909 Unspecified asthma, uncomplicated: Secondary | ICD-10-CM | POA: Insufficient documentation

## 2019-12-28 DIAGNOSIS — R109 Unspecified abdominal pain: Secondary | ICD-10-CM | POA: Diagnosis not present

## 2019-12-28 DIAGNOSIS — E119 Type 2 diabetes mellitus without complications: Secondary | ICD-10-CM | POA: Diagnosis not present

## 2019-12-28 DIAGNOSIS — Z79899 Other long term (current) drug therapy: Secondary | ICD-10-CM | POA: Insufficient documentation

## 2019-12-28 DIAGNOSIS — Z7984 Long term (current) use of oral hypoglycemic drugs: Secondary | ICD-10-CM | POA: Insufficient documentation

## 2019-12-28 DIAGNOSIS — R1084 Generalized abdominal pain: Secondary | ICD-10-CM | POA: Diagnosis not present

## 2019-12-28 DIAGNOSIS — U071 COVID-19: Secondary | ICD-10-CM | POA: Diagnosis not present

## 2019-12-28 DIAGNOSIS — I10 Essential (primary) hypertension: Secondary | ICD-10-CM | POA: Diagnosis not present

## 2019-12-28 DIAGNOSIS — Z7982 Long term (current) use of aspirin: Secondary | ICD-10-CM | POA: Insufficient documentation

## 2019-12-28 LAB — URINALYSIS, ROUTINE W REFLEX MICROSCOPIC
Bilirubin Urine: NEGATIVE
Glucose, UA: NEGATIVE mg/dL
Hgb urine dipstick: NEGATIVE
Ketones, ur: NEGATIVE mg/dL
Leukocytes,Ua: NEGATIVE
Nitrite: NEGATIVE
Protein, ur: NEGATIVE mg/dL
Specific Gravity, Urine: 1.001 — ABNORMAL LOW (ref 1.005–1.030)
pH: 6 (ref 5.0–8.0)

## 2019-12-28 LAB — CBC WITH DIFFERENTIAL/PLATELET
Abs Immature Granulocytes: 0.01 K/uL (ref 0.00–0.07)
Basophils Absolute: 0 K/uL (ref 0.0–0.1)
Basophils Relative: 0 %
Eosinophils Absolute: 0 K/uL (ref 0.0–0.5)
Eosinophils Relative: 0 %
HCT: 40.3 % (ref 36.0–46.0)
Hemoglobin: 13.3 g/dL (ref 12.0–15.0)
Immature Granulocytes: 0 %
Lymphocytes Relative: 33 %
Lymphs Abs: 1.7 K/uL (ref 0.7–4.0)
MCH: 29.8 pg (ref 26.0–34.0)
MCHC: 33 g/dL (ref 30.0–36.0)
MCV: 90.2 fL (ref 80.0–100.0)
Monocytes Absolute: 0.4 K/uL (ref 0.1–1.0)
Monocytes Relative: 8 %
Neutro Abs: 3 K/uL (ref 1.7–7.7)
Neutrophils Relative %: 59 %
Platelets: 217 K/uL (ref 150–400)
RBC: 4.47 MIL/uL (ref 3.87–5.11)
RDW: 13 % (ref 11.5–15.5)
WBC: 5.1 K/uL (ref 4.0–10.5)
nRBC: 0 % (ref 0.0–0.2)

## 2019-12-28 LAB — COMPREHENSIVE METABOLIC PANEL WITH GFR
ALT: 21 U/L (ref 0–44)
AST: 22 U/L (ref 15–41)
Albumin: 3.5 g/dL (ref 3.5–5.0)
Alkaline Phosphatase: 44 U/L (ref 38–126)
Anion gap: 11 (ref 5–15)
BUN: 7 mg/dL (ref 6–20)
CO2: 25 mmol/L (ref 22–32)
Calcium: 8.6 mg/dL — ABNORMAL LOW (ref 8.9–10.3)
Chloride: 103 mmol/L (ref 98–111)
Creatinine, Ser: 0.58 mg/dL (ref 0.44–1.00)
GFR calc Af Amer: >60 mL/min
GFR calc non Af Amer: >60 mL/min
Glucose, Bld: 117 mg/dL — ABNORMAL HIGH (ref 70–99)
Potassium: 3.5 mmol/L (ref 3.5–5.1)
Sodium: 139 mmol/L (ref 135–145)
Total Bilirubin: 0.5 mg/dL (ref 0.3–1.2)
Total Protein: 8 g/dL (ref 6.5–8.1)

## 2019-12-28 LAB — POC SARS CORONAVIRUS 2 AG -  ED: SARS Coronavirus 2 Ag: POSITIVE — AB

## 2019-12-28 LAB — LIPASE, BLOOD: Lipase: 18 U/L (ref 11–51)

## 2019-12-28 MED ORDER — OXYCODONE-ACETAMINOPHEN 5-325 MG PO TABS
2.0000 | ORAL_TABLET | Freq: Once | ORAL | Status: DC
  Filled 2019-12-28: qty 2

## 2019-12-28 MED ORDER — TRAMADOL HCL 50 MG PO TABS: 50.0000 mg | ORAL_TABLET | Freq: Four times a day (QID) | ORAL | 0 refills | Status: DC | PRN

## 2019-12-28 MED ORDER — HYDROMORPHONE HCL 1 MG/ML IJ SOLN
0.5000 mg | Freq: Once | INTRAMUSCULAR | Status: AC
  Administered 2019-12-28: 0.5 mg via INTRAVENOUS
  Filled 2019-12-28: qty 1

## 2019-12-28 MED ORDER — ONDANSETRON HCL 4 MG/2ML IJ SOLN
4.0000 mg | Freq: Once | INTRAMUSCULAR | Status: AC
  Administered 2019-12-28: 4 mg via INTRAVENOUS
  Filled 2019-12-28: qty 2

## 2019-12-28 MED ORDER — IOHEXOL 300 MG/ML  SOLN
100.0000 mL | Freq: Once | INTRAMUSCULAR | Status: AC | PRN
  Administered 2019-12-28: 100 mL via INTRAVENOUS

## 2019-12-28 NOTE — ED Provider Notes (Signed)
[facility] EMERGENCY DEPARTMENT Provider Note   CSN: [id number] Arrival date & time: 12/28/19  [id number]     History Chief Complaint  Patient presents with  . Abdominal Pain    [name] is a 59 y.o. female.  The history is provided by the patient. No language interpreter was used.  Abdominal Pain Pain location:  Generalized Pain quality: aching   Pain radiates to:  Does not radiate Pain severity:  Moderate Onset quality:  Gradual Timing:  Constant Progression:  Worsening Chronicity:  New Relieved by:  Nothing Worsened by:  Nothing Ineffective treatments:  None tried Associated symptoms: nausea   Associated symptoms: no fever and no shortness of breath   Risk factors: has not had multiple surgeries and not pregnant   Pt complains of left flank pain and left lower abdominal pain.  Pt was seen here 3 days ago  and had  A renal ct to evaluate for kidney stone      Past Medical History:  Diagnosis Date  . Anxiety   . Asthma    diagnosed as an adult  . Diabetes mellitus   . Diverticulosis 01/13/2018  . Hypertension     Patient Active Problem List   Diagnosis Date Noted  . Mild intermittent asthma, uncomplicated 123456  . Seasonal and perennial allergic rhinitis 01/20/2019  . Itching 01/20/2019  . Diverticulosis 01/13/2018  . Intractable nausea and vomiting 12/25/2016  . Diabetes ([place]) 09/02/2016  . Asthma 09/02/2016  . MORBID OBESITY 07/02/2009  . Essential hypertension 07/02/2009  . CHEST DISCOMFORT 07/02/2009    Past Surgical History:  Procedure Laterality Date  . ABDOMINAL HYSTERECTOMY    . CARPAL TUNNEL RELEASE    . CESAREAN SECTION    . CHOLECYSTECTOMY    . SINUS EXPLORATION    . TRIGGER FINGER RELEASE    . WRIST SURGERY       OB History   No obstetric history on file.     Family History  Problem Relation Age of Onset  . Hypertension Mother   . Diabetes Mother   . Stroke Mother     Social History   Tobacco Use  .  Smoking status: Former Smoker    Types: Cigarettes    Quit date: 01/20/1989    Years since quitting: 30.9  . Smokeless tobacco: Never Used  Substance Use Topics  . Alcohol use: No    Alcohol/week: 0.0 standard drinks  . Drug use: No    Home Medications Prior to Admission medications   Medication Sig Start Date End Date Taking? Authorizing Provider  acetaminophen (TYLENOL) 500 MG tablet Take 1,000 mg by mouth at bedtime as needed. For pain    [provider]  albuterol (PROVENTIL HFA;VENTOLIN HFA) 108 (90 Base) MCG/ACT inhaler Inhale 1-2 puffs into the lungs every 6 (six) hours as needed for wheezing or shortness of breath.    [provider]  ALPRAZolam Duanne Moron) 0.5 MG tablet Take 0.5 mg by mouth 2 (two) times daily as needed for anxiety.     [provider]  amLODipine (NORVASC) 10 MG tablet Take 10 mg by mouth daily.    [provider]  aspirin EC 81 MG tablet Take 81 mg by mouth daily.    [provider]  azelastine (ASTELIN) 0.1 % nasal spray Place 2 sprays into both nostrils 2 (two) times daily. Use in each nostril as directed 11/08/18   Valentina Shaggy, MD  EPINEPHrine 0.3 mg/0.3 mL IJ SOAJ injection  10/12/18   [provider]  estrogens, conjugated, (PREMARIN) 0.625 MG tablet Take 0.625 mg by mouth daily.     [provider]  fexofenadine (ALLEGRA) 180 MG tablet Take by mouth.    [provider]  fluticasone (FLONASE) 50 MCG/ACT nasal spray USE 2 SPRAYS INTO BOTH NOSTRILS 2 TIMES DAILY AS NEEDED FOR ALLERGIES OR RHINITIS. 10/13/19   Valentina Shaggy, MD  gabapentin (NEURONTIN) 100 MG capsule Take 1-3 caps qhs prn 08/10/19   Lilia Argue R, DO  gabapentin (NEURONTIN) 300 MG capsule Take 1 capsule (300 mg total) by mouth at bedtime. 07/20/19   Thurman Coyer, DO  glipiZIDE (GLUCOTROL) 5 MG tablet  10/13/18   [provider]  levocetirizine (XYZAL) 5 MG tablet Take 5 mg by mouth daily.     [provider]  losartan (COZAAR) 25 MG tablet Take 50 mg by mouth daily.     [provider]  metFORMIN (GLUCOPHAGE) 500 MG tablet Take 500 mg by mouth 2 (two) times daily. 01/14/16   [provider]  metoprolol succinate (TOPROL-XL) 50 MG 24 hr tablet Take 2 tablets by mouth daily.  05/26/16   [provider]  montelukast (SINGULAIR) 10 MG tablet Take 10 mg by mouth at bedtime.    [provider]  ondansetron (ZOFRAN ODT) 4 MG disintegrating tablet Take 1 tablet (4 mg total) by mouth every 8 (eight) hours as needed for nausea or vomiting. 12/25/19   Albrizze, Verline Lema E, PA-C  pantoprazole (PROTONIX) 40 MG tablet Take by mouth. 06/29/16   [provider]  potassium chloride (KLOR-CON) 10 MEQ tablet Take 1 tablet (10 mEq total) by mouth daily for 4 days. 12/25/19 12/29/19  Albrizze, Kaitlyn E, PA-C  ranitidine (ZANTAC) 150 MG tablet Take 150 mg by mouth daily.    [provider]  sitaGLIPtin (JANUVIA) 100 MG tablet Take 100 mg by mouth daily.    [provider]  traMADol (ULTRAM) 50 MG tablet Take 1 tablet (50 mg total) by mouth every 6 (six) hours as needed. 12/28/19 12/27/20  Fransico Meadow, PA-C    Allergies    Barium-containing compounds, Statins, Sulfa antibiotics, and Victoza [liraglutide]  Review of Systems   Review of Systems  Constitutional: Negative for fever.  Respiratory: Negative for shortness of breath.   Gastrointestinal: Positive for abdominal pain and nausea.  All other systems reviewed and are negative.   Physical Exam Updated Vital Signs BP (!) 166/68 (BP Location: Right Arm)   Pulse 81   Temp 98.7 F (37.1 C) (Oral)   Resp 18   Ht 5\' 2"  (1.575 m)   Wt 111 kg   SpO2 97%   BMI 44.76 kg/m   Physical Exam Vitals and nursing note reviewed.  Constitutional:      Appearance: She is well-developed.  HENT:     Head: Normocephalic.  Eyes:     Extraocular Movements: Extraocular movements intact.      Pupils: Pupils are equal, round, and reactive to light.  Cardiovascular:     Rate and Rhythm: Normal rate and regular rhythm.  Pulmonary:     Effort: Pulmonary effort is normal.  Abdominal:     General: Abdomen is flat. Bowel sounds are decreased. There is no distension.     Palpations: Abdomen is soft.     Tenderness: There is abdominal tenderness in the left lower quadrant.  Musculoskeletal:        General: Normal range of motion.  Cervical back: Normal range of motion.  Skin:    General: Skin is warm.  Neurological:     General: No focal deficit present.     Mental Status: She is alert and oriented to person, place, and time.  Psychiatric:        Mood and Affect: Mood normal.     ED Results / Procedures / Treatments   Labs (all labs ordered are listed, but only abnormal results are displayed) Labs Reviewed  COMPREHENSIVE METABOLIC PANEL - Abnormal; Notable for the following components:      Result Value   Glucose, Bld 117 (*)    Calcium 8.6 (*)    All other components within normal limits  URINALYSIS, ROUTINE W REFLEX MICROSCOPIC - Abnormal; Notable for the following components:   Color, Urine STRAW (*)    Specific Gravity, Urine 1.001 (*)    All other components within normal limits  POC SARS CORONAVIRUS 2 AG -  ED - Abnormal; Notable for the following components:   SARS Coronavirus 2 Ag POSITIVE (*)    All other components within normal limits  CBC WITH DIFFERENTIAL/PLATELET  LIPASE, BLOOD    EKG None  Radiology CT ABDOMEN PELVIS W CONTRAST  Result Date: 12/28/2019 CLINICAL DATA:  Left lower quadrant abdominal pain and flank pain since Sunday night. EXAM: CT ABDOMEN AND PELVIS WITH CONTRAST TECHNIQUE: Multidetector CT imaging of the abdomen and pelvis was performed using the standard protocol following bolus administration of intravenous contrast. CONTRAST:  141mL OMNIPAQUE IOHEXOL 300 MG/ML  SOLN COMPARISON:  12/26/2016 FINDINGS: Lower chest: Patchy basilar  infiltrates or atelectasis. No pleural effusion. The heart is mildly enlarged but appears stable. No pericardial effusion. Hepatobiliary: Geographic fatty infiltration is noted but no focal hepatic lesions or intrahepatic biliary dilatation. The gallbladder is surgically absent. No common bile duct dilatation. Pancreas: No mass, inflammation or ductal dilatation. Spleen: Normal size.  No focal lesions. Adrenals/Urinary Tract: Adrenal glands are unremarkable. Bilateral renal cysts are noted but no worrisome renal lesions or hydronephrosis. The bladder is unremarkable. Stomach/Bowel: The stomach, duodenum, small bowel and colon are unremarkable. No acute inflammatory changes, mass lesions or obstructive findings. Scattered colonic diverticulosis but no findings for acute diverticulitis. Vascular/Lymphatic: Scattered atherosclerotic calcifications involving the aorta and iliac arteries but no aneurysm or dissection. The branch vessels are patent. The major venous structures are patent. Small scattered mesenteric and retroperitoneal lymph nodes but no mass or overt adenopathy the. Reproductive: The uterus and ovaries are surgically absent. Other: No pelvic mass or adenopathy. No free pelvic fluid collections. No inguinal mass or adenopathy. No abdominal wall hernia or subcutaneous lesions. Musculoskeletal: No significant bony findings. Scoliosis and advanced lumbar facet disease. IMPRESSION: 1. No acute abdominal/pelvic findings, mass lesions or adenopathy. 2. Mild cardiac enlargement and patchy bibasilar atelectasis or infiltrates. 3. Status post cholecystectomy.  No biliary dilatation. 4. Geographic fatty infiltration of the liver. 5. Benign renal cysts. 6. Scattered colonic diverticulosis but no findings for acute diverticulitis. Electronically Signed   By: Marijo Sanes M.D.   On: 12/28/2019 12:12    Procedures Procedures (including critical care time)  Medications Ordered in ED Medications  HYDROmorphone  (DILAUDID) injection 0.5 mg (0.5 mg Intravenous Given 12/28/19 1137)  ondansetron (ZOFRAN) injection 4 mg (4 mg Intravenous Given 12/28/19 1136)  iohexol (OMNIPAQUE) 300 MG/ML solution 100 mL (100 mLs Intravenous Contrast Given 12/28/19 1142)    ED Course  I have reviewed the triage vital signs and the nursing notes.  Pertinent labs &  imaging results that were available during my care of the patient were reviewed by me and considered in my medical decision making (see chart for details).    MDM Rules/Calculators/A&P                      NDN  Pt scan show no acute abnormality  Covid is positive.  Pt given rx for tramadol for pain.  Pt advised to return if symptoms worsen or cahnge.  Final Clinical Impression(s) / ED Diagnoses Final diagnoses:  COVID-19  Generalized abdominal pain    Rx / DC Orders ED Discharge Orders         Ordered    traMADol (ULTRAM) 50 MG tablet  Every 6 hours PRN     12/28/19 1306        An After Visit Summary was printed and given to the patient.    Sidney Ace 12/28/19 1324    Davonna Belling, MD 12/28/19 [phone]

## 2019-12-28 NOTE — ED Triage Notes (Signed)
Pt co pain in luq radiating around to left flank area since Sunday night.  Reports nausea, no vomiting.  Denies diarrhea.  Reports frequent urination.  LBM  Was this morning but was small.

## 2019-12-28 NOTE — Discharge Instructions (Signed)
Your covid test is positive  

## 2019-12-29 ENCOUNTER — Telehealth: Payer: Self-pay | Admitting: Nurse Practitioner

## 2019-12-29 ENCOUNTER — Other Ambulatory Visit: Payer: Self-pay

## 2019-12-29 MED FILL — FREESTYLE LITE TEST STRIP: 50 days supply | Qty: 100 | Fill #0

## 2019-12-29 MED FILL — FREESTYLE LITE METER: 1 days supply | Qty: 1 | Fill #0

## 2019-12-29 MED FILL — FREESTYLE LANCETS: 50 days supply | Qty: 100 | Fill #0

## 2019-12-29 NOTE — Telephone Encounter (Signed)
Called to Discuss with patient about Covid symptoms and the use of bamlanivimab, a monoclonal antibody infusion for those with mild to moderate Covid symptoms and at a high risk of hospitalization.     Pt is qualified for this infusion at the [facility] center due to co-morbid conditions and/or a member of an at-risk group.     Unable to reach pt  

## 2020-01-08 DIAGNOSIS — G5601 Carpal tunnel syndrome, right upper limb: Secondary | ICD-10-CM | POA: Diagnosis not present

## 2020-01-16 DIAGNOSIS — G5601 Carpal tunnel syndrome, right upper limb: Secondary | ICD-10-CM | POA: Diagnosis not present

## 2020-01-22 MED FILL — ALPRAZolam 0.5 MG TABS: 0.5 | 30 days supply | Qty: 60 | Fill #2

## 2020-01-22 MED FILL — glipiZIDE 5 MG TABS: 5 | 90 days supply | Qty: 90 | Fill #2

## 2020-01-22 MED FILL — LOSARTAN POTASSIUM 50 MG TA: 50 | 30 days supply | Qty: 30 | Fill #8

## 2020-01-22 MED FILL — PREMARIN 0.625 MG TABLET: 0.625 | 30 days supply | Qty: 30 | Fill #1

## 2020-01-24 NOTE — Progress Notes (Deleted)
Cardiology Office Note   Date:  01/24/2020   ID:  [name], [name] [birth year], MRN [id number]  PCP:  Redmond School, MD  Cardiologist:   Dorris Carnes, MD   Pt presents for f/u of HTN     History of Present Illness: [name] is a 59 y.o. female with a history of HTN, DM, obesity, diverticulitis, gastritis  FHX of CHF in father, HTN and DM  I last saw her in 2019     She was seen in [facility] ED in Jan with COVID  Outpatient Medications Prior to Visit  Medication Sig Dispense Refill  . acetaminophen (TYLENOL) 500 MG tablet Take 1,000 mg by mouth at bedtime as needed. For pain    . albuterol (PROVENTIL HFA;VENTOLIN HFA) 108 (90 Base) MCG/ACT inhaler Inhale 1-2 puffs into the lungs every 6 (six) hours as needed for wheezing or shortness of breath.    . ALPRAZolam (XANAX) 0.5 MG tablet Take 0.5 mg by mouth 2 (two) times daily as needed for anxiety.     Marland Kitchen amLODipine (NORVASC) 10 MG tablet Take 10 mg by mouth daily.    Marland Kitchen aspirin EC 81 MG tablet Take 81 mg by mouth daily.    Marland Kitchen azelastine (ASTELIN) 0.1 % nasal spray Place 2 sprays into both nostrils 2 (two) times daily. Use in each nostril as directed 30 mL 12  . EPINEPHrine 0.3 mg/0.3 mL IJ SOAJ injection   11  . estrogens, conjugated, (PREMARIN) 0.625 MG tablet Take 0.625 mg by mouth daily.     . fexofenadine (ALLEGRA) 180 MG tablet Take by mouth.    . fluticasone (FLONASE) 50 MCG/ACT nasal spray USE 2 SPRAYS INTO BOTH NOSTRILS 2 TIMES DAILY AS NEEDED FOR ALLERGIES OR RHINITIS. 16 g 0  . gabapentin (NEURONTIN) 100 MG capsule Take 1-3 caps qhs prn 60 capsule 0  . gabapentin (NEURONTIN) 300 MG capsule Take 1 capsule (300 mg total) by mouth at bedtime. 30 capsule 1  . glipiZIDE (GLUCOTROL) 5 MG tablet   3  . levocetirizine (XYZAL) 5 MG tablet Take 5 mg by mouth daily.    Marland Kitchen losartan (COZAAR) 25 MG tablet Take 50 mg by mouth daily.     . metFORMIN (GLUCOPHAGE) 500 MG tablet Take 500 mg by mouth 2 (two) times daily.  3  .  metoprolol succinate (TOPROL-XL) 50 MG 24 hr tablet Take 2 tablets by mouth daily.   11  . montelukast (SINGULAIR) 10 MG tablet Take 10 mg by mouth at bedtime.    . ondansetron (ZOFRAN ODT) 4 MG disintegrating tablet Take 1 tablet (4 mg total) by mouth every 8 (eight) hours as needed for nausea or vomiting. 10 tablet 0  . pantoprazole (PROTONIX) 40 MG tablet Take by mouth.    . potassium chloride (KLOR-CON) 10 MEQ tablet Take 1 tablet (10 mEq total) by mouth daily for 4 days. 4 tablet 0  . ranitidine (ZANTAC) 150 MG tablet Take 150 mg by mouth daily.    . sitaGLIPtin (JANUVIA) 100 MG tablet Take 100 mg by mouth daily.    . traMADol (ULTRAM) 50 MG tablet Take 1 tablet (50 mg total) by mouth every 6 (six) hours as needed. 15 tablet 0   No facility-administered medications prior to visit.     Allergies:   Barium-containing compounds, Statins, Sulfa antibiotics, and Victoza [liraglutide]   Past Medical History:  Diagnosis Date  . Anxiety   . Asthma    diagnosed as an adult  .  Diabetes mellitus   . Diverticulosis 01/13/2018  . Hypertension     Past Surgical History:  Procedure Laterality Date  . ABDOMINAL HYSTERECTOMY    . CARPAL TUNNEL RELEASE    . CESAREAN SECTION    . CHOLECYSTECTOMY    . SINUS EXPLORATION    . TRIGGER FINGER RELEASE    . WRIST SURGERY       Social History:  The patient  reports that she quit smoking about 31 years ago. Her smoking use included cigarettes. She has never used smokeless tobacco. She reports that she does not drink alcohol or use drugs.   Family History:  The patient's family history includes Diabetes in her mother; Hypertension in her mother; Stroke in her mother.    ROS:  Please see the history of present illness. All other systems are reviewed and  Negative to the above problem except as noted.    PHYSICAL EXAM: VS:  There were no vitals taken for this visit.  GEN: Morbidly obese 59 yo  in no acute distress  HEENT: normal  Neck: no JVD,  carotid bruits, or masses Cardiac: RRR; no murmurs, rubs, or gallops,no edema  Respiratory:  clear to auscultation bilaterally, normal work of breathing  Rare uppper airway wheeze  GI: soft, nontender, nondistended, + BS  No hepatomegaly  MS: no deformity Moving all extremities   Skin: warm and dry, no rash Neuro:  Strength and sensation are intact Psych: euthymic mood, full affect   EKG:  EKG is not ordered today.   Lipid Panel    Component Value Date/Time   CHOL 172 09/04/2016 0700   TRIG 203 (H) 09/04/2016 0700   HDL 61 09/04/2016 0700   CHOLHDL 2.8 09/04/2016 0700   VLDL 41 (H) 09/04/2016 0700   LDLCALC 70 09/04/2016 0700      Wt Readings from Last 3 Encounters:  12/28/19 244 lb 11.4 oz (111 kg)  12/25/19 246 lb 0.5 oz (111.6 kg)  08/10/19 246 lb (111.6 kg)      ASSESSMENT AND PLAN:  Pt is a 59 yo with no history of CAD  Hx of DM and HTN    1  HTN  BP is controlled  Keep on same meds  2  Hx mild atherosclerosis of aorta   Will get lipids from Dr Gerarda Fraction  Needs control  3  DM  Encouraged her to keep up with wt watchers   4.  Morbid obesity  Again, losing wt   Encouraged her to continue   Keep active       Signed, Dorris Carnes, MD  01/24/2020 9:16 PM    [address], [place], [place]  [address] Phone: [phone]; Fax: [phone]

## 2020-01-25 MED FILL — IPRATROPIUM 0.06% SPRAY: 0.06 | 20 days supply | Qty: 15 | Fill #0

## 2020-01-26 ENCOUNTER — Ambulatory Visit: Payer: 59 | Admitting: Internal Medicine

## 2020-02-14 MED FILL — JANUVIA 100 MG TABLET: 100 | 60 days supply | Qty: 60 | Fill #2

## 2020-02-14 MED FILL — AMLODIPINE BESYLATE 10 MG T: 10 | 90 days supply | Qty: 90 | Fill #2

## 2020-02-19 DIAGNOSIS — M1991 Primary osteoarthritis, unspecified site: Secondary | ICD-10-CM | POA: Diagnosis not present

## 2020-02-19 DIAGNOSIS — I10 Essential (primary) hypertension: Secondary | ICD-10-CM | POA: Diagnosis not present

## 2020-02-19 DIAGNOSIS — E119 Type 2 diabetes mellitus without complications: Secondary | ICD-10-CM | POA: Diagnosis not present

## 2020-02-19 DIAGNOSIS — K219 Gastro-esophageal reflux disease without esophagitis: Secondary | ICD-10-CM | POA: Diagnosis not present

## 2020-02-19 DIAGNOSIS — Z1389 Encounter for screening for other disorder: Secondary | ICD-10-CM | POA: Diagnosis not present

## 2020-02-19 DIAGNOSIS — E1165 Type 2 diabetes mellitus with hyperglycemia: Secondary | ICD-10-CM | POA: Diagnosis not present

## 2020-02-19 DIAGNOSIS — Z0001 Encounter for general adult medical examination with abnormal findings: Secondary | ICD-10-CM | POA: Diagnosis not present

## 2020-02-19 DIAGNOSIS — Z6841 Body Mass Index (BMI) 40.0 and over, adult: Secondary | ICD-10-CM | POA: Diagnosis not present

## 2020-02-21 MED FILL — IPRATROPIUM 0.06% SPRAY: 0.06 | 20 days supply | Qty: 15 | Fill #1

## 2020-02-21 MED FILL — FREESTYLE LITE TEST STRIP: 50 days supply | Qty: 100 | Fill #0

## 2020-02-21 MED FILL — FREESTYLE LITE METER: 1 days supply | Qty: 1 | Fill #0

## 2020-02-21 MED FILL — METOPROLOL SUCCINATE ER 100: 100 | 90 days supply | Qty: 90 | Fill #0

## 2020-02-21 MED FILL — LOSARTAN POTASSIUM 50 MG TA: 50 | 30 days supply | Qty: 30 | Fill #9

## 2020-02-21 MED FILL — ALPRAZolam 0.5 MG TABS: 0.5 | 30 days supply | Qty: 60 | Fill #0

## 2020-02-21 MED FILL — PREMARIN 0.625 MG TABLET: 0.625 | 30 days supply | Qty: 30 | Fill #2

## 2020-03-01 ENCOUNTER — Ambulatory Visit: Payer: 59 | Admitting: Student

## 2020-03-15 MED FILL — PANTOPRAZOLE SOD DR 40 MG T: 40 | 90 days supply | Qty: 90 | Fill #2

## 2020-03-19 DIAGNOSIS — Z6841 Body Mass Index (BMI) 40.0 and over, adult: Secondary | ICD-10-CM | POA: Diagnosis not present

## 2020-03-19 DIAGNOSIS — J0100 Acute maxillary sinusitis, unspecified: Secondary | ICD-10-CM | POA: Diagnosis not present

## 2020-03-25 MED FILL — ALPRAZolam 0.5 MG TABS: 0.5 | 30 days supply | Qty: 60 | Fill #1

## 2020-03-25 MED FILL — LOSARTAN POTASSIUM 50 MG TA: 50 | 30 days supply | Qty: 30 | Fill #10

## 2020-03-25 MED FILL — PREMARIN 0.625 MG TABLET: 0.625 | 30 days supply | Qty: 30 | Fill #5

## 2020-03-27 DIAGNOSIS — E119 Type 2 diabetes mellitus without complications: Secondary | ICD-10-CM | POA: Diagnosis not present

## 2020-03-27 DIAGNOSIS — I10 Essential (primary) hypertension: Secondary | ICD-10-CM | POA: Diagnosis not present

## 2020-03-27 DIAGNOSIS — E669 Obesity, unspecified: Secondary | ICD-10-CM | POA: Diagnosis not present

## 2020-03-27 DIAGNOSIS — Z6841 Body Mass Index (BMI) 40.0 and over, adult: Secondary | ICD-10-CM | POA: Diagnosis not present

## 2020-03-28 NOTE — Progress Notes (Signed)
Cardiology Office Note    Date:  03/29/2020   ID:  Tina, Woods 08-Feb-1961, MRN [id number]  PCP:  Redmond School, MD  Cardiologist: Dorris Carnes, MD    Chief Complaint  Patient presents with  . Follow-up    History of Present Illness:    [name] is a 59 y.o. female with past medical history of HTN, Type II DM, and Diverticulitis who presents to the office today for overdue follow-up.  She was last examined by Dr. Harrington Challenger in 05/2018 and denied any recent chest pain or dyspnea on exertion at that time. She was participating in [employer]. BP was well controlled and she was continued on her current medication regimen including Amlodipine 10 mg daily, Losartan 50 mg daily and Toprol-XL 50 mg daily.  In talking with the patient today, she reports overall doing well from a cardiac perspective. 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 While she does not exercise regularly, she is mowing her own yard with a push mower along with several family members yards. She performed those activities earlier this morning and denies any symptoms with this. No recent chest pain or dyspnea on exertion. She does have seasonal allergies. No recent orthopnea, PND or persistent lower extremity edema.  BP has been well controlled when checked at home with SBP in the 130's to 140's.  BP is at 126/76 during today's visit.  Past Medical History:  Diagnosis Date  . Anxiety   . Asthma    diagnosed as an adult  . Diabetes mellitus   . Diverticulosis 01/13/2018  . Hypertension     Past Surgical History:  Procedure Laterality Date  . ABDOMINAL HYSTERECTOMY    . CARPAL TUNNEL RELEASE    . CESAREAN SECTION    . CHOLECYSTECTOMY    . SINUS EXPLORATION    . TRIGGER FINGER RELEASE    . WRIST SURGERY      Current Medications: Outpatient Medications  Prior to Visit  Medication Sig Dispense Refill  . acetaminophen (TYLENOL) 500 MG tablet Take 1,000 mg by mouth at bedtime as needed. For pain    . albuterol (PROVENTIL HFA;VENTOLIN HFA) 108 (90 Base) MCG/ACT inhaler Inhale 1-2 puffs into the lungs every 6 (six) hours as needed for wheezing or shortness of breath.    . ALPRAZolam (XANAX) 0.5 MG tablet Take 0.5 mg by mouth 2 (two) times daily as needed for anxiety.     Marland Kitchen amLODipine (NORVASC) 10 MG tablet Take 10 mg by mouth daily.    Marland Kitchen aspirin EC 81 MG tablet Take 81 mg by mouth daily.    Marland Kitchen azelastine (ASTELIN) 0.1 % nasal spray Place 2 sprays into both nostrils 2 (two) times daily. Use in each nostril as directed 30 mL 12  . EPINEPHrine 0.3 mg/0.3 mL IJ SOAJ injection   11  . estrogens, conjugated, (PREMARIN) 0.625 MG tablet Take 0.625 mg by mouth daily.     . fexofenadine (ALLEGRA) 180 MG tablet Take by mouth.    . fluticasone (FLONASE) 50 MCG/ACT nasal spray USE 2 SPRAYS INTO BOTH NOSTRILS 2 TIMES DAILY AS NEEDED FOR ALLERGIES OR RHINITIS. 16 g 0  . gabapentin (NEURONTIN) 100 MG capsule Take 1-3 caps qhs prn 60 capsule 0  . gabapentin (NEURONTIN) 300 MG capsule Take 1 capsule (300 mg total) by mouth  at bedtime. 30 capsule 1  . glipiZIDE (GLUCOTROL) 5 MG tablet   3  . levocetirizine (XYZAL) 5 MG tablet Take 5 mg by mouth daily.    . metFORMIN (GLUCOPHAGE) 500 MG tablet Take 500 mg by mouth 2 (two) times daily.  3  . montelukast (SINGULAIR) 10 MG tablet Take 10 mg by mouth at bedtime.    . ondansetron (ZOFRAN ODT) 4 MG disintegrating tablet Take 1 tablet (4 mg total) by mouth every 8 (eight) hours as needed for nausea or vomiting. 10 tablet 0  . pantoprazole (PROTONIX) 40 MG tablet Take by mouth.    . ranitidine (ZANTAC) 150 MG tablet Take 150 mg by mouth daily.    . sitaGLIPtin (JANUVIA) 100 MG tablet Take 100 mg by mouth daily.    . traMADol (ULTRAM) 50 MG tablet Take 1 tablet (50 mg total) by mouth every 6 (six) hours as needed. 15 tablet 0    . losartan (COZAAR) 25 MG tablet Take 50 mg by mouth daily.     . metoprolol succinate (TOPROL-XL) 50 MG 24 hr tablet Take 2 tablets by mouth daily.   11  . potassium chloride (KLOR-CON) 10 MEQ tablet Take 1 tablet (10 mEq total) by mouth daily for 4 days. 4 tablet 0   No facility-administered medications prior to visit.     Allergies:   Barium-containing compounds, Statins, Sulfa antibiotics, and Victoza [liraglutide]   Social History   Socioeconomic History  . Marital status: Divorced    Spouse name: Not on file  . Number of children: Not on file  . Years of education: Not on file  . Highest education level: Not on file  Occupational History  . Not on file  Tobacco Use  . Smoking status: Former Smoker    Types: Cigarettes    Quit date: 01/20/1989    Years since quitting: 31.2  . Smokeless tobacco: Never Used  Substance and Sexual Activity  . Alcohol use: No    Alcohol/week: 0.0 standard drinks  . Drug use: No  . Sexual activity: Yes    Birth control/protection: Surgical  Other Topics Concern  . Not on file  Social History Narrative  . Not on file   Social Determinants of Health   Financial Resource Strain:   . Difficulty of Paying Living Expenses:   Food Insecurity:   . Worried About Charity fundraiser in the Last Year:   . Arboriculturist in the Last Year:   Transportation Needs:   . Film/video editor (Medical):   Marland Kitchen Lack of Transportation (Non-Medical):   Physical Activity:   . Days of Exercise per Week:   . Minutes of Exercise per Session:   Stress:   . Feeling of Stress :   Social Connections:   . Frequency of Communication with Friends and Family:   . Frequency of Social Gatherings with Friends and Family:   . Attends Religious Services:   . Active Member of Clubs or Organizations:   . Attends Archivist Meetings:   Marland Kitchen Marital Status:      Family History:  The patient's family history includes Diabetes in her mother; Hypertension in her  mother; Stroke in her mother.   Review of Systems:   Please see the history of present illness.     General:  No chills, fever, night sweats or weight changes.  Cardiovascular:  No chest pain, dyspnea on exertion, edema, orthopnea, palpitations, paroxysmal nocturnal dyspnea. Dermatological: No rash,  lesions/masses Respiratory: No dyspnea. Positive for cough (with allergies).  Urologic: No hematuria, dysuria Abdominal:   No nausea, vomiting, diarrhea, bright red blood per rectum, melena, or hematemesis Neurologic:  No visual changes, wkns, changes in mental status. All other systems reviewed and are otherwise negative except as noted above.   Physical Exam:    VS:  BP 126/76 (BP Location: Left Arm)   Pulse 88   Temp 98.3 F (36.8 C)   Ht 5\' 3"  (1.6 m)   Wt 248 lb 3.2 oz (112.6 kg)   SpO2 95%   BMI 43.97 kg/m    General: Well developed, well nourished,female appearing in no acute distress. Head: Normocephalic, atraumatic, sclera non-icteric.  Neck: No carotid bruits. JVD not elevated.  Lungs: Respirations regular and unlabored, without wheezes or rales.  Heart: Regular rate and rhythm. No S3 or S4.  No murmur, no rubs, or gallops appreciated. Abdomen: Soft, non-tender, non-distended. No obvious abdominal masses. Msk:  Strength and tone appear normal for age. No obvious joint deformities or effusions. Extremities: No clubbing or cyanosis. Trace ankle edema bilaterally.  Distal pedal pulses are 2+ bilaterally. Neuro: Alert and oriented X 3. Moves all extremities spontaneously. No focal deficits noted. Psych:  Responds to questions appropriately with a normal affect. Skin: No rashes or lesions noted  Wt Readings from Last 3 Encounters:  03/29/20 248 lb 3.2 oz (112.6 kg)  12/28/19 244 lb 11.4 oz (111 kg)  12/25/19 246 lb 0.5 oz (111.6 kg)      Studies/Labs Reviewed:   EKG:  EKG is ordered today.  The ekg ordered today demonstrates NSR, HR 75 with no acute ST abnormalities.    Recent Labs: 12/28/2019: ALT 21; BUN 7; Creatinine, Ser 0.58; Hemoglobin 13.3; Platelets 217; Potassium 3.5; Sodium 139   Lipid Panel    Component Value Date/Time   CHOL 172 09/04/2016 0700   TRIG 203 (H) 09/04/2016 0700   HDL 61 09/04/2016 0700   CHOLHDL 2.8 09/04/2016 0700   VLDL 41 (H) 09/04/2016 0700   LDLCALC 70 09/04/2016 0700    Additional studies/ records that were reviewed today include:   Carotid Dopplers: 01/2019 RIGHT CAROTID ARTERY: Suggestion of minimal noncalcified plaque in the distal common carotid artery. No evidence of focal ICA plaque or right ICA stenosis.  RIGHT VERTEBRAL ARTERY: Antegrade flow with normal waveform and velocity.  LEFT CAROTID ARTERY: Minimal amount of partially calcified plaque at the level of the proximal left ICA. Velocities and waveforms are normal and estimated left ICA stenosis is less than 50%.  LEFT VERTEBRAL ARTERY: Antegrade flow with normal waveform and velocity.  IMPRESSION: Minimal plaque in the proximal left ICA with estimated left ICA stenosis of less than 50%. No evidence of right ICA plaque or stenosis.   Assessment:    1. Essential hypertension   2. Type 2 diabetes mellitus without complication, without long-term current use of insulin ([place])   3. Family history of cardiac disorder      Plan:   In order of problems listed above:  1. HTN - BP is well controlled at 126/76 during today's visit. Continue current medication regimen with Amlodipine 10 mg daily, Losartan 50 mg daily and Toprol-XL 100 mg daily.  2. Type 2 DM - Followed by PCP.  Will request a copy of most recent records. She remains on Metformin 500 mg twice daily and Sitagliptin 100 mg daily.  3. Family History of CAD - she denies any recent anginal symptoms and is able to  perform more than 4 METS of activity. EKG today shows normal sinus rhythm with no acute ST abnormalities.  Continue with risk factor modification. I did encourage her to  make [place] aware if any symptoms change as she has never undergone ischemic evaluation given her asymptomatic state. Intolerant to statins. Remains on ASA 81mg  daily.    Medication Adjustments/Labs and Tests Ordered: Current medicines are reviewed at length with the patient today.  Concerns regarding medicines are outlined above.  Medication changes, Labs and Tests ordered today are listed in the Patient Instructions below. Patient Instructions  Medication Instructions:  Your physician recommends that you continue on your current medications as directed. Please refer to the Current Medication list given to you today.  *If you need a refill on your cardiac medications before your next appointment, please call your pharmacy*   Lab Work: .NONE  If you have labs (blood work) drawn today and your tests are completely normal, you will receive your results only by: Marland Kitchen MyChart Message (if you have MyChart) OR . A paper copy in the mail If you have any lab test that is abnormal or we need to change your treatment, we will call you to review the results.   Testing/Procedures: NONE   Follow-Up: At [facility], you and your health needs are our priority.  As part of our continuing mission to provide you with exceptional heart care, we have created designated Provider Care Teams.  These Care Teams include your primary Cardiologist (physician) and Advanced Practice Providers (APPs -  Physician Assistants and Nurse Practitioners) who all work together to provide you with the care you need, when you need it.  We recommend signing up for the patient portal called "MyChart".  Sign up information is provided on this After Visit Summary.  MyChart is used to connect with patients for Virtual Visits (Telemedicine).  Patients are able to view lab/test results, encounter notes, upcoming appointments, etc.  Non-urgent messages can be sent to your provider as well.   To learn more about what you can do with MyChart,  go to [email or web address].    Your next appointment:   1 year(s)  The format for your next appointment:   In Person  Provider:   You may see Dorris Carnes, MD or one of the following Advanced Practice Providers on your designated Care Team:    Bernerd Pho, PA-C   Ermalinda Barrios, PA-C     Other Instructions Thank you for choosing [facility]!       Signed, Erma Heritage, PA-C  03/29/2020 5:12 PM    [facility] [address] [place], [place] [address] Phone: [phone] Fax: [phone]

## 2020-03-29 ENCOUNTER — Encounter: Payer: Self-pay | Admitting: *Deleted

## 2020-03-29 ENCOUNTER — Ambulatory Visit: Payer: 59 | Admitting: Student

## 2020-03-29 ENCOUNTER — Other Ambulatory Visit: Payer: Self-pay

## 2020-03-29 ENCOUNTER — Encounter: Payer: Self-pay | Admitting: Student

## 2020-03-29 VITALS — BP 126/76 | HR 88 | Temp 98.3°F | Ht 63.0 in | Wt 248.2 lb

## 2020-03-29 DIAGNOSIS — Z8249 Family history of ischemic heart disease and other diseases of the circulatory system: Secondary | ICD-10-CM

## 2020-03-29 DIAGNOSIS — E119 Type 2 diabetes mellitus without complications: Secondary | ICD-10-CM | POA: Diagnosis not present

## 2020-03-29 DIAGNOSIS — I10 Essential (primary) hypertension: Secondary | ICD-10-CM

## 2020-03-29 MED ORDER — METOPROLOL SUCCINATE ER 100 MG PO TB24: 100.0000 mg | ORAL_TABLET | Freq: Every day | ORAL | Status: DC

## 2020-03-29 MED ORDER — LOSARTAN POTASSIUM 50 MG PO TABS: 50.0000 mg | ORAL_TABLET | Freq: Every day | ORAL | Status: DC

## 2020-03-29 NOTE — Patient Instructions (Signed)
Medication Instructions:  Your physician recommends that you continue on your current medications as directed. Please refer to the Current Medication list given to you today.  *If you need a refill on your cardiac medications before your next appointment, please call your pharmacy*   Lab Work: .NONE  If you have labs (blood work) drawn today and your tests are completely normal, you will receive your results only by: Marland Kitchen MyChart Message (if you have MyChart) OR . A paper copy in the mail If you have any lab test that is abnormal or we need to change your treatment, we will call you to review the results.   Testing/Procedures: NONE   Follow-Up: At [facility], you and your health needs are our priority.  As part of our continuing mission to provide you with exceptional heart care, we have created designated Provider Care Teams.  These Care Teams include your primary Cardiologist (physician) and Advanced Practice Providers (APPs -  Physician Assistants and Nurse Practitioners) who all work together to provide you with the care you need, when you need it.  We recommend signing up for the patient portal called "MyChart".  Sign up information is provided on this After Visit Summary.  MyChart is used to connect with patients for Virtual Visits (Telemedicine).  Patients are able to view lab/test results, encounter notes, upcoming appointments, etc.  Non-urgent messages can be sent to your provider as well.   To learn more about what you can do with MyChart, go to [email or web address].    Your next appointment:   1 year(s)  The format for your next appointment:   In Person  Provider:   You may see Dorris Carnes, MD or one of the following Advanced Practice Providers on your designated Care Team:    Bernerd Pho, PA-C   Ermalinda Barrios, PA-C     Other Instructions Thank you for choosing [facility]!

## 2020-04-08 DIAGNOSIS — E782 Mixed hyperlipidemia: Secondary | ICD-10-CM | POA: Diagnosis not present

## 2020-04-08 DIAGNOSIS — Z1389 Encounter for screening for other disorder: Secondary | ICD-10-CM | POA: Diagnosis not present

## 2020-04-08 DIAGNOSIS — Z6841 Body Mass Index (BMI) 40.0 and over, adult: Secondary | ICD-10-CM | POA: Diagnosis not present

## 2020-04-10 MED FILL — glipiZIDE 5 MG TABS: 5 | 90 days supply | Qty: 90 | Fill #0

## 2020-04-15 MED FILL — METFORMIN HCL ER 500 MG TB2: 500 | 90 days supply | Qty: 180 | Fill #0

## 2020-04-19 DIAGNOSIS — Z6841 Body Mass Index (BMI) 40.0 and over, adult: Secondary | ICD-10-CM | POA: Diagnosis not present

## 2020-04-19 DIAGNOSIS — Z01419 Encounter for gynecological examination (general) (routine) without abnormal findings: Secondary | ICD-10-CM | POA: Diagnosis not present

## 2020-04-24 DIAGNOSIS — I10 Essential (primary) hypertension: Secondary | ICD-10-CM | POA: Diagnosis not present

## 2020-04-24 DIAGNOSIS — Z6841 Body Mass Index (BMI) 40.0 and over, adult: Secondary | ICD-10-CM | POA: Diagnosis not present

## 2020-04-24 DIAGNOSIS — H01009 Unspecified blepharitis unspecified eye, unspecified eyelid: Secondary | ICD-10-CM | POA: Diagnosis not present

## 2020-04-24 DIAGNOSIS — H1013 Acute atopic conjunctivitis, bilateral: Secondary | ICD-10-CM | POA: Diagnosis not present

## 2020-04-24 DIAGNOSIS — E119 Type 2 diabetes mellitus without complications: Secondary | ICD-10-CM | POA: Diagnosis not present

## 2020-05-08 MED FILL — ALPRAZolam 0.5 MG TABS: 0.5 | 30 days supply | Qty: 60 | Fill #0

## 2020-05-08 MED FILL — AMLODIPINE BESYLATE 10 MG T: 10 | 90 days supply | Qty: 90 | Fill #0

## 2020-05-08 MED FILL — METOPROLOL SUCCINATE ER 100: 100 | 90 days supply | Qty: 90 | Fill #1

## 2020-05-08 MED FILL — JANUVIA 100 MG TABLET: 100 | 30 days supply | Qty: 30 | Fill #0

## 2020-05-22 ENCOUNTER — Other Ambulatory Visit (HOSPITAL_COMMUNITY): Payer: Self-pay | Admitting: Internal Medicine

## 2020-05-22 DIAGNOSIS — Z6841 Body Mass Index (BMI) 40.0 and over, adult: Secondary | ICD-10-CM | POA: Diagnosis not present

## 2020-05-22 DIAGNOSIS — J0100 Acute maxillary sinusitis, unspecified: Secondary | ICD-10-CM | POA: Diagnosis not present

## 2020-05-27 MED FILL — IPRATROPIUM 0.06% SPRAY: 0.06 | 20 days supply | Qty: 15 | Fill #2

## 2020-06-07 MED FILL — PANTOPRAZOLE SOD DR 40 MG T: 40 | 90 days supply | Qty: 90 | Fill #3

## 2020-06-10 ENCOUNTER — Other Ambulatory Visit: Payer: Self-pay | Admitting: Internal Medicine

## 2020-06-10 DIAGNOSIS — Z1231 Encounter for screening mammogram for malignant neoplasm of breast: Secondary | ICD-10-CM

## 2020-06-14 ENCOUNTER — Other Ambulatory Visit (HOSPITAL_COMMUNITY): Payer: Self-pay | Admitting: Otolaryngology

## 2020-06-14 DIAGNOSIS — J343 Hypertrophy of nasal turbinates: Secondary | ICD-10-CM | POA: Diagnosis not present

## 2020-06-14 DIAGNOSIS — H6123 Impacted cerumen, bilateral: Secondary | ICD-10-CM | POA: Diagnosis not present

## 2020-06-14 DIAGNOSIS — J342 Deviated nasal septum: Secondary | ICD-10-CM | POA: Diagnosis not present

## 2020-06-14 DIAGNOSIS — H900 Conductive hearing loss, bilateral: Secondary | ICD-10-CM | POA: Diagnosis not present

## 2020-06-14 DIAGNOSIS — J310 Chronic rhinitis: Secondary | ICD-10-CM | POA: Diagnosis not present

## 2020-06-14 MED FILL — FLUTICASONE PROP 50 MCG SPR: 50 | 30 days supply | Qty: 16 | Fill #0

## 2020-06-17 DIAGNOSIS — S39012A Strain of muscle, fascia and tendon of lower back, initial encounter: Secondary | ICD-10-CM | POA: Diagnosis not present

## 2020-06-19 MED FILL — LOSARTAN POTASSIUM 50 MG TA: 50 | 30 days supply | Qty: 30 | Fill #1

## 2020-06-19 MED FILL — PREMARIN 0.625 MG TABLET: 0.625 | 30 days supply | Qty: 30 | Fill #1

## 2020-06-19 MED FILL — ALPRAZolam 0.5 MG TABS: 0.5 | 30 days supply | Qty: 60 | Fill #1

## 2020-06-20 ENCOUNTER — Encounter (INDEPENDENT_AMBULATORY_CARE_PROVIDER_SITE_OTHER): Payer: Self-pay | Admitting: Family Medicine

## 2020-06-20 ENCOUNTER — Ambulatory Visit (INDEPENDENT_AMBULATORY_CARE_PROVIDER_SITE_OTHER): Payer: 59 | Admitting: Family Medicine

## 2020-06-20 ENCOUNTER — Other Ambulatory Visit: Payer: Self-pay

## 2020-06-20 VITALS — BP 161/74 | HR 67 | Temp 98.6°F | Ht 63.0 in | Wt 243.0 lb

## 2020-06-20 DIAGNOSIS — R0683 Snoring: Secondary | ICD-10-CM

## 2020-06-20 DIAGNOSIS — Z9189 Other specified personal risk factors, not elsewhere classified: Secondary | ICD-10-CM

## 2020-06-20 DIAGNOSIS — Z780 Asymptomatic menopausal state: Secondary | ICD-10-CM

## 2020-06-20 DIAGNOSIS — Z1331 Encounter for screening for depression: Secondary | ICD-10-CM | POA: Diagnosis not present

## 2020-06-20 DIAGNOSIS — R5383 Other fatigue: Secondary | ICD-10-CM | POA: Diagnosis not present

## 2020-06-20 DIAGNOSIS — E119 Type 2 diabetes mellitus without complications: Secondary | ICD-10-CM | POA: Diagnosis not present

## 2020-06-20 DIAGNOSIS — I10 Essential (primary) hypertension: Secondary | ICD-10-CM | POA: Diagnosis not present

## 2020-06-20 DIAGNOSIS — K219 Gastro-esophageal reflux disease without esophagitis: Secondary | ICD-10-CM

## 2020-06-20 DIAGNOSIS — E1169 Type 2 diabetes mellitus with other specified complication: Secondary | ICD-10-CM | POA: Diagnosis not present

## 2020-06-20 DIAGNOSIS — R0602 Shortness of breath: Secondary | ICD-10-CM

## 2020-06-20 DIAGNOSIS — Z6841 Body Mass Index (BMI) 40.0 and over, adult: Secondary | ICD-10-CM

## 2020-06-20 DIAGNOSIS — Z0289 Encounter for other administrative examinations: Secondary | ICD-10-CM

## 2020-06-20 DIAGNOSIS — E7849 Other hyperlipidemia: Secondary | ICD-10-CM | POA: Diagnosis not present

## 2020-06-20 NOTE — Progress Notes (Signed)
Chief Complaint:   OBESITY [name] (MR# [id number]) is a 59 y.o. female who presents for evaluation and treatment of obesity and related comorbidities. Current BMI is Body mass index is 43.05 kg/m. [name] has been struggling with her weight for many years and has been unsuccessful in either losing weight, maintaining weight loss, or reaching her healthy weight goal.  [name] is currently in the action stage of change and ready to dedicate time achieving and maintaining a healthier weight. [name] is interested in becoming our patient and working on intensive lifestyle modifications including (but not limited to) diet and exercise for weight loss.  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  [name] is single and lives with her daughter (50).  [name] works 3rd shift.  [name] is currently on prednisone for low back pain.  [name]'s habits were reviewed today and are as follows: [name] thinks her family will eat healthier with her, her desired weight loss is 52 pounds, [name] has been heavy most of her life, [name] started gaining weight after childbirth, her heaviest weight ever was 356 pounds, [name] is a picky eater and doesn't like to eat healthier foods, [name] craves bread, ice cream, and pasta, [name] snacks frequently in the evenings, [name] wakes up frequently in the middle of the night to eat, [name] skips lunch frequently, [name] frequently makes poor food choices and [name] struggles with emotional eating.  Depression Screen [name]'s Food and Mood (modified PHQ-9) score was 6.  Depression screen PHQ 2/9 06/20/2020  Decreased Interest 1  Down, Depressed, Hopeless 0  PHQ - 2 Score 1  Altered sleeping 1  Tired, decreased energy 2  Change in appetite 1  Feeling bad or failure about yourself  1  Trouble concentrating 0  Moving slowly or fidgety/restless 0  Suicidal thoughts 0  PHQ-9 Score 6  Difficult doing work/chores Not difficult at all  Some encounter information is  confidential and restricted. Go to Review Flowsheets activity to see all data.   Subjective:   1. Other fatigue [name] admits to daytime somnolence and reports waking up still tired. Patent has a history of symptoms of daytime fatigue, morning fatigue and snoring. [name] generally gets 6 or 7 hours of sleep per night, and states that [name] has generally restful sleep. Snoring is present. Apneic episodes are not present. Epworth Sleepiness Score is 5.  2. SOB (shortness of breath) on exertion [name] notes increasing shortness of breath with exercising and seems to be worsening over time with weight gain. [name] notes getting out of breath sooner with activity than [name] used to. This has gotten worse recently. [name] denies shortness of breath at rest or orthopnea.  3. Essential hypertension Review: taking medications as instructed, no medication side effects noted, no chest pain on exertion, no dyspnea on exertion, no swelling of ankles.  [name] is taking metoprolol, losartan, and amlodipine.  BP Readings from Last 3 Encounters:  06/20/20 (!) 161/74  03/29/20 126/76  12/28/19 (!) 156/72   4. Type 2 diabetes mellitus with other specified complication, without long-term current use of insulin (HCC) Medications reviewed. Diabetic ROS: no polyuria or polydipsia, no chest pain, dyspnea or TIA's, no numbness, tingling or pain in extremities.  [name] is on metformin, Januvia, and glipizide.  [name] is statin intolerant (caused weakness).  [name] is followed by Dr. Gerarda Fraction, her PCP.  Her A1c is 6.9, and has been stable since March.  [name] took Bydureon in the  past.  Lab Results  Component Value Date   HGBA1C 7.6 09/02/2016   HGBA1C 6.9 10/21/2015   HGBA1C 7.8 (H) 06/04/2015   Lab Results  Component Value Date   LDLCALC 70 09/04/2016   CREATININE 0.58 12/28/2019   5. Gastroesophageal reflux disease, unspecified whether esophagitis present [name] has been experiencing acid reflux symptoms. [name] is taking Protonix 40 mg  daily.  6. Postmenopausal [name] is postmenopausal and is taking Premarin 0.625 mg daily.  7. Snores [name] has no history of a sleep study.  Epworth score is 5.  Situation Chance of Dozing or Sleeping  Sitting and reading 1 = slight chance of dozing or sleeping  Watching television 1 = slight chance of dozing or sleeping  Sitting inactive in a public place (theater or meeting) 0 = would never doze or sleep  Lying down in the afternoon when circumstances permit 2 = moderate chance of dozing or sleeping  Sitting and talking to someone 0 = would never doze or sleep  Sitting quietly after lunch without alcohol 1 = slight chance of dozing or sleeping  In a car, while stopped for a few minutes in traffic 0 = would never doze or sleep  TOTAL 5   8. Depression screening [name] was screened for depression as a regular part of her new patient workup.  PHQ-9 is 6.  9. At risk for heart disease [name] is at a higher than average risk for cardiovascular disease due to obesity.   Assessment/Plan:   1. Other fatigue [name] does feel that her weight is causing her energy to be lower than it should be. Fatigue may be related to obesity, depression or many other causes. Labs will be ordered, and in the meanwhile, [name] will focus on self care including making healthy food choices, increasing physical activity and focusing on stress reduction.  Orders - EKG 12-Lead - Anemia panel - T3 - T4, free - TSH - VITAMIN D 25 Hydroxy (Vit-D Deficiency, Fractures)  2. SOB (shortness of breath) on exertion [name] does feel that [name] gets out of breath more easily that [name] used to when [name] exercises. [name]'s shortness of breath appears to be obesity related and exercise induced. [name] has agreed to work on weight loss and gradually increase exercise to treat her exercise induced shortness of breath. Will continue to monitor closely.  3. Essential hypertension [name] is working on healthy weight loss and  exercise to improve blood pressure control. We will watch for signs of hypotension as [name] continues her lifestyle modifications.  Orders - CBC with Differential/Platelet - Comprehensive metabolic panel - Lipid Panel With LDL/HDL Ratio  4. Type 2 diabetes mellitus with other specified complication, without long-term current use of insulin (HCC) Good blood sugar control is important to decrease the likelihood of diabetic complications such as nephropathy, neuropathy, limb loss, blindness, coronary artery disease, and death. Intensive lifestyle modification including diet, exercise and weight loss are the first line of treatment for diabetes.   Orders - Hemoglobin A1c  5. Gastroesophageal reflux disease, unspecified whether esophagitis present Intensive lifestyle modifications are the first line treatment for this issue. We discussed several lifestyle modifications today and [name] will continue to work on diet, exercise and weight loss efforts. Orders and follow up as documented in patient record.   Counseling . If a person has gastroesophageal reflux disease (GERD), food and stomach acid move back up into the esophagus and cause symptoms or problems such as damage to the esophagus. . Anti-reflux measures  include: raising the head of the bed, avoiding tight clothing or belts, avoiding eating late at night, not lying down shortly after mealtime, and achieving weight loss. . Avoid ASA, NSAID's, caffeine, alcohol, and tobacco.  . OTC Pepcid and/or Tums are often very helpful for as needed use.  Marland Kitchen However, for persisting chronic or daily symptoms, stronger medications like Omeprazole may be needed. . You may need to avoid foods and drinks such as: ? Coffee and tea (with or without caffeine). ? Drinks that contain alcohol. ? Energy drinks and sports drinks. ? Bubbly (carbonated) drinks or sodas. ? Chocolate and cocoa. ? Peppermint and mint flavorings. ? Garlic and onions. ? Horseradish. ? Spicy  and acidic foods. These include peppers, chili powder, curry powder, vinegar, hot sauces, and BBQ sauce. ? Citrus fruit juices and citrus fruits, such as oranges, lemons, and limes. ? Tomato-based foods. These include red sauce, chili, salsa, and pizza with red sauce. ? Fried and fatty foods. These include donuts, french fries, potato chips, and high-fat dressings. ? High-fat meats. These include hot dogs, rib eye steak, sausage, ham, and bacon.  6. Postmenopausal Current treatment plan is effective, no change in therapy.  7. Snores Will continue to monitor.  8. Depression screening [name] had a positive depression screening. Depression is commonly associated with obesity and often results in emotional eating behaviors. We will monitor this closely and work on CBT to help improve the non-hunger eating patterns. Referral to Psychology may be required if no improvement is seen as [name] continues in our clinic.  9. At risk for heart disease [name] was given approximately 15 minutes of coronary artery disease prevention counseling today. [name] is 59 y.o. female and has risk factors for heart disease including obesity. We discussed intensive lifestyle modifications today with an emphasis on specific weight loss instructions and strategies.   Repetitive spaced learning was employed today to elicit superior memory formation and behavioral change.  10. Class 3 severe obesity with serious comorbidity and body mass index (BMI) of 40.0 to 44.9 in adult, unspecified obesity type [name]) [name] is currently in the action stage of change and her goal is to continue with weight loss efforts. I recommend [name] begin the structured treatment plan as follows:  [name] has agreed to the Category 1 Plan.  Exercise goals: No exercise has been prescribed at this time.   Behavioral modification strategies: increasing lean protein intake.  [name] was informed of the importance of frequent follow-up visits to maximize her  success with intensive lifestyle modifications for her multiple health conditions. [name] was informed we would discuss her lab results at her next visit unless there is a critical issue that needs to be addressed sooner. Syan agreed to keep her next visit at the agreed upon time to discuss these results.  Objective:   Blood pressure (!) 161/74, pulse 67, temperature 98.6 F (37 C), temperature source Oral, height 5\' 3"  (1.6 m), weight 243 lb (110.2 kg), SpO2 97 %. Body mass index is 43.05 kg/m.  EKG: Normal sinus rhythm, rate 74 bpm.  Indirect Calorimeter completed today shows a VO2 of 180 and a REE of 1260.  Her calculated basal metabolic rate is 1950 thus her basal metabolic rate is worse than expected.  General: Cooperative, alert, well developed, in no acute distress. HEENT: Conjunctivae and lids unremarkable. Cardiovascular: Regular rhythm.  Lungs: Normal work of breathing. Neurologic: No focal deficits.   Lab Results  Component Value Date   CREATININE 0.58 12/28/2019  BUN 7 12/28/2019   NA 139 12/28/2019   K 3.5 12/28/2019   CL 103 12/28/2019   CO2 25 12/28/2019   Lab Results  Component Value Date   ALT 21 12/28/2019   AST 22 12/28/2019   ALKPHOS 44 12/28/2019   BILITOT 0.5 12/28/2019   Lab Results  Component Value Date   HGBA1C 7.6 09/02/2016   HGBA1C 6.9 10/21/2015   HGBA1C 7.8 (H) 06/04/2015   HGBA1C 7.4 (H) 03/19/2015   HGBA1C (H) 05/16/2011    6.9 (NOTE)                                                                       According to the ADA Clinical Practice Recommendations for 2011, when HbA1c is used as a screening test:   >=6.5%   Diagnostic of Diabetes Mellitus           (if abnormal result  is confirmed)  5.7-6.4%   Increased risk of developing Diabetes Mellitus  References:Diagnosis and Classification of Diabetes Mellitus,Diabetes HYWV,3710,62(IRSWN 1):S62-S69 and Standards of Medical Care in         Diabetes - 2011,Diabetes Care,2011,34  (Suppl  1):S11-S61.   Lab Results  Component Value Date   TSH 0.236 (L) 03/19/2015   Lab Results  Component Value Date   CHOL 172 09/04/2016   HDL 61 09/04/2016   LDLCALC 70 09/04/2016   TRIG 203 (H) 09/04/2016   CHOLHDL 2.8 09/04/2016   Lab Results  Component Value Date   WBC 5.1 12/28/2019   HGB 13.3 12/28/2019   HCT 40.3 12/28/2019   MCV 90.2 12/28/2019   PLT 217 12/28/2019   Attestation Statements:  This is the patient's first visit at [facility]. The patient's NEW PATIENT PACKET was reviewed at length. Included in the packet: current and past health history, medications, allergies, ROS, gynecologic history (women only), surgical history, family history, social history, weight history, weight loss surgery history (for those that have had weight loss surgery), nutritional evaluation, mood and food questionnaire, PHQ9, Epworth questionnaire, sleep habits questionnaire, patient life and health improvement goals questionnaire. These will all be scanned into the patient's chart under media.   During the visit, I independently reviewed the patient's EKG, bioimpedance scale results, and indirect calorimeter results. I used this information to tailor a meal plan for the patient that will help her to lose weight and will improve her obesity-related conditions going forward. I performed a medically necessary appropriate examination and/or evaluation. I discussed the assessment and treatment plan with the patient. The patient was provided an opportunity to ask questions and all were answered. The patient agreed with the plan and demonstrated an understanding of the instructions. Labs were ordered at this visit and will be reviewed at the next visit unless more critical results need to be addressed immediately. Clinical information was updated and documented in the EMR.   I, Water quality scientist, CMA, am acting as transcriptionist for Briscoe Deutscher, DO  I have reviewed the above documentation  for accuracy and completeness, and I agree with the above. Briscoe Deutscher, DO

## 2020-06-21 LAB — CBC WITH DIFFERENTIAL/PLATELET
Basophils Absolute: 0 10*3/uL (ref 0.0–0.2)
Basos: 0 %
EOS (ABSOLUTE): 0.1 10*3/uL (ref 0.0–0.4)
Eos: 1 %
Hemoglobin: 13.1 g/dL (ref 11.1–15.9)
Immature Grans (Abs): 0.1 10*3/uL (ref 0.0–0.1)
Immature Granulocytes: 1 %
Lymphocytes Absolute: 3 10*3/uL (ref 0.7–3.1)
Lymphs: 28 %
MCH: 30.7 pg (ref 26.6–33.0)
MCHC: 34.7 g/dL (ref 31.5–35.7)
MCV: 89 fL (ref 79–97)
Monocytes Absolute: 0.8 10*3/uL (ref 0.1–0.9)
Monocytes: 7 %
Neutrophils Absolute: 6.9 10*3/uL (ref 1.4–7.0)
Neutrophils: 63 %
Platelets: 321 10*3/uL (ref 150–450)
RBC: 4.27 x10E6/uL (ref 3.77–5.28)
RDW: 13.5 % (ref 11.7–15.4)
WBC: 10.8 10*3/uL (ref 3.4–10.8)

## 2020-06-21 LAB — COMPREHENSIVE METABOLIC PANEL
ALT: 12 IU/L (ref 0–32)
AST: 13 IU/L (ref 0–40)
Albumin/Globulin Ratio: 1.5 (ref 1.2–2.2)
Albumin: 4.3 g/dL (ref 3.8–4.8)
Alkaline Phosphatase: 50 IU/L (ref 48–121)
BUN/Creatinine Ratio: 23 (ref 12–28)
BUN: 12 mg/dL (ref 8–27)
Bilirubin Total: <0.2 mg/dL (ref 0.0–1.2)
CO2: 27 mmol/L (ref 20–29)
Calcium: 9.3 mg/dL (ref 8.7–10.3)
Chloride: 102 mmol/L (ref 96–106)
Creatinine, Ser: 0.52 mg/dL — ABNORMAL LOW (ref 0.57–1.00)
GFR calc Af Amer: 119 mL/min/{1.73_m2} (ref >59)
GFR calc non Af Amer: 103 mL/min/{1.73_m2} (ref >59)
Globulin, Total: 2.8 g/dL (ref 1.5–4.5)
Glucose: 96 mg/dL (ref 65–99)
Potassium: 4 mmol/L (ref 3.5–5.2)
Sodium: 142 mmol/L (ref 134–144)
Total Protein: 7.1 g/dL (ref 6.0–8.5)

## 2020-06-21 LAB — T4, FREE: Free T4: 1.26 ng/dL (ref 0.82–1.77)

## 2020-06-21 LAB — ANEMIA PANEL
Ferritin: 74 ng/mL (ref 15–150)
Folate, Hemolysate: 350 ng/mL
Folate, RBC: 926 ng/mL
Hematocrit: 37.8 % (ref 34.0–46.6)
Iron Saturation: 14 % — ABNORMAL LOW (ref 15–55)
Iron: 59 ug/dL (ref 27–139)
Retic Ct Pct: 1.4 % (ref 0.6–2.6)
Total Iron Binding Capacity: 429 ug/dL (ref 250–450)
UIBC: 370 ug/dL — ABNORMAL HIGH (ref 118–369)
Vitamin B-12: 372 pg/mL (ref 232–1245)

## 2020-06-21 LAB — HEMOGLOBIN A1C
Est. average glucose Bld gHb Est-mCnc: 154 mg/dL
Hgb A1c MFr Bld: 7 % — ABNORMAL HIGH (ref 4.8–5.6)

## 2020-06-21 LAB — TSH: TSH: 0.286 u[IU]/mL — ABNORMAL LOW (ref 0.450–4.500)

## 2020-06-21 LAB — LIPID PANEL WITH LDL/HDL RATIO
Cholesterol, Total: 221 mg/dL — ABNORMAL HIGH (ref 100–199)
HDL: 78 mg/dL (ref >39)
LDL Chol Calc (NIH): 128 mg/dL — ABNORMAL HIGH (ref 0–99)
LDL/HDL Ratio: 1.6 ratio (ref 0.0–3.2)
Triglycerides: 89 mg/dL (ref 0–149)
VLDL Cholesterol Cal: 15 mg/dL (ref 5–40)

## 2020-06-21 LAB — VITAMIN D 25 HYDROXY (VIT D DEFICIENCY, FRACTURES): Vit D, 25-Hydroxy: 12.2 ng/mL — ABNORMAL LOW (ref 30.0–100.0)

## 2020-06-21 LAB — T3: T3, Total: 114 ng/dL (ref 71–180)

## 2020-06-21 LAB — MICROALBUMIN / CREATININE URINE RATIO
Creatinine, Urine: 70.3 mg/dL
Microalb/Creat Ratio: 89 mg/g creat — ABNORMAL HIGH (ref 0–29)
Microalbumin, Urine: 62.3 ug/mL

## 2020-07-04 ENCOUNTER — Encounter (INDEPENDENT_AMBULATORY_CARE_PROVIDER_SITE_OTHER): Payer: Self-pay | Admitting: Family Medicine

## 2020-07-04 ENCOUNTER — Ambulatory Visit (INDEPENDENT_AMBULATORY_CARE_PROVIDER_SITE_OTHER): Payer: 59 | Admitting: Family Medicine

## 2020-07-04 ENCOUNTER — Other Ambulatory Visit: Payer: Self-pay

## 2020-07-04 VITALS — BP 153/73 | HR 70 | Temp 98.0°F | Ht 63.0 in | Wt 240.0 lb

## 2020-07-04 DIAGNOSIS — E1159 Type 2 diabetes mellitus with other circulatory complications: Secondary | ICD-10-CM

## 2020-07-04 DIAGNOSIS — E1129 Type 2 diabetes mellitus with other diabetic kidney complication: Secondary | ICD-10-CM | POA: Diagnosis not present

## 2020-07-04 DIAGNOSIS — E0590 Thyrotoxicosis, unspecified without thyrotoxic crisis or storm: Secondary | ICD-10-CM

## 2020-07-04 DIAGNOSIS — Z9189 Other specified personal risk factors, not elsewhere classified: Secondary | ICD-10-CM | POA: Diagnosis not present

## 2020-07-04 DIAGNOSIS — R809 Proteinuria, unspecified: Secondary | ICD-10-CM | POA: Diagnosis not present

## 2020-07-04 DIAGNOSIS — E559 Vitamin D deficiency, unspecified: Secondary | ICD-10-CM

## 2020-07-04 DIAGNOSIS — E785 Hyperlipidemia, unspecified: Secondary | ICD-10-CM

## 2020-07-04 DIAGNOSIS — I10 Essential (primary) hypertension: Secondary | ICD-10-CM

## 2020-07-04 DIAGNOSIS — E1169 Type 2 diabetes mellitus with other specified complication: Secondary | ICD-10-CM | POA: Diagnosis not present

## 2020-07-04 DIAGNOSIS — Z6841 Body Mass Index (BMI) 40.0 and over, adult: Secondary | ICD-10-CM

## 2020-07-04 MED ORDER — TRULICITY 0.75 MG/0.5ML ~~LOC~~ SOAJ: 0.7500 mg | SUBCUTANEOUS | 0 refills | Status: DC

## 2020-07-04 MED FILL — TRULICITY 0.75 MG/0.5 ML PE: 0.75 | 28 days supply | Qty: 2 | Fill #0

## 2020-07-04 NOTE — Progress Notes (Signed)
Chief Complaint:   OBESITY [name] is here to discuss her progress with her obesity treatment plan along with follow-up of her obesity related diagnoses. [name] is on the Category 1 Plan and states she is following her eating plan approximately 90% of the time. [name] states she is exercising for 0 minutes 0 times per week.  Today's visit was #: 2 Starting weight: 243 lbs Starting date: 06/20/2020 Today's weight: 240 lbs Today's date: 07/04/2020 Total lbs lost to date: 3 lbs Total lbs lost since last in-office visit: 3 lbs  Interim History: [name] says she has more energy.  Evening blood sugars are great.  Low of 86, morning high of 148 (forgot to take medication).  She denies hunger issues, cravings, or constipation.  Subjective:   1. Type 2 diabetes mellitus with proteinuria (HCC) Medications reviewed. Diabetic ROS: no polyuria or polydipsia, no chest pain, dyspnea or TIA's, no numbness, tingling or pain in extremities.   Lab Results  Component Value Date   HGBA1C 7.0 (H) 06/20/2020   HGBA1C 7.6 09/02/2016   HGBA1C 6.9 10/21/2015   Lab Results  Component Value Date   LDLCALC 128 (H) 06/20/2020   CREATININE 0.52 (L) 06/20/2020   2. Hyperlipidemia associated with type 2 diabetes mellitus ([place]) [name] has hyperlipidemia and has been trying to improve her cholesterol levels with intensive lifestyle modification including a low saturated fat diet, exercise and weight loss. She denies any chest pain, claudication or myalgias.  Lab Results  Component Value Date   ALT 12 06/20/2020   AST 13 06/20/2020   ALKPHOS 50 06/20/2020   BILITOT <0.2 06/20/2020   Lab Results  Component Value Date   CHOL 221 (H) 06/20/2020   HDL 78 06/20/2020   LDLCALC 128 (H) 06/20/2020   TRIG 89 06/20/2020   CHOLHDL 2.8 09/04/2016   3. Vitamin D deficiency [name]'s Vitamin D level was 12.2 on 06/20/2020. She is currently taking no vitamin D supplement. She denies nausea, vomiting or muscle  weakness.  4. Subclinical hyperthyroidism [name] is on no medications for this.  Lab Results  Component Value Date   TSH 0.286 (L) 06/20/2020   5. Hypertension associated with type 2 diabetes mellitus ([place]) Review: taking medications as instructed, no medication side effects noted, no chest pain on exertion, no dyspnea on exertion, no swelling of ankles.   BP Readings from Last 3 Encounters:  07/04/20 (!) 153/73  06/20/20 (!) 161/74  03/29/20 126/76   6. At risk for heart disease [name] is at a higher than average risk for cardiovascular disease due to obesity.   Assessment/Plan:   1. Type 2 diabetes mellitus with proteinuria (HCC) Good blood sugar control is important to decrease the likelihood of diabetic complications such as nephropathy, neuropathy, limb loss, blindness, coronary artery disease, and death. Intensive lifestyle modification including diet, exercise and weight loss are the first line of treatment for diabetes.  [name] will stop glipizide and start Trulicity, as per below.   - Dulaglutide (TRULICITY) 5.36 UY/4.0HK SOPN; Inject 0.5 mLs (0.75 mg total) into the skin once a week.  Dispense: 4 pen; Refill: 0  2. Hyperlipidemia associated with type 2 diabetes mellitus ([place]) Cardiovascular risk and specific lipid/LDL goals reviewed.  We discussed several lifestyle modifications today and [name] will continue to work on diet, exercise and weight loss efforts. Orders and follow up as documented in patient record.   Counseling Intensive lifestyle modifications are the first line treatment for this issue. . Dietary changes: Increase  soluble fiber. Decrease simple carbohydrates. . Exercise changes: Moderate to vigorous-intensity aerobic activity 150 minutes per week if tolerated. . Lipid-lowering medications: see documented in medical record.  3. Vitamin D deficiency Low Vitamin D level contributes to fatigue and are associated with obesity, breast, and colon cancer. She  agrees to start to take prescription Vitamin D @50 ,000 IU every week and will follow-up for routine testing of Vitamin D, at least 2-3 times per year to avoid over-replacement.  4. Subclinical hyperthyroidism Will place referral today to Endocrinology.  - Ambulatory referral to Endocrinology  5. Hypertension associated with type 2 diabetes mellitus ([place]) [name] is working on healthy weight loss and exercise to improve blood pressure control. We will watch for signs of hypotension as she continues her lifestyle modifications.  6. At risk for heart disease [name] was given approximately 15 minutes of coronary artery disease prevention counseling today. She is 59 y.o. female and has risk factors for heart disease including obesity. We discussed intensive lifestyle modifications today with an emphasis on specific weight loss instructions and strategies.   Repetitive spaced learning was employed today to elicit superior memory formation and behavioral change.  7. Class 3 severe obesity with serious comorbidity and body mass index (BMI) of 40.0 to 44.9 in adult, unspecified obesity type [name]) [name] is currently in the action stage of change. As such, her goal is to continue with weight loss efforts. She has agreed to the Category 1 Plan.   Exercise goals: No exercise has been prescribed at this time.  Behavioral modification strategies: increasing lean protein intake, increasing water intake and increasing high fiber foods.  [name] has agreed to follow-up with our clinic in 2-3 weeks. She was informed of the importance of frequent follow-up visits to maximize her success with intensive lifestyle modifications for her multiple health conditions.   Objective:   Blood pressure (!) 153/73, pulse 70, temperature 98 F (36.7 C), temperature source Oral, height 5\' 3"  (1.6 m), weight (!) 240 lb (108.9 kg), SpO2 97 %. Body mass index is 42.51 kg/m.  General: Cooperative, alert, well developed, in no  acute distress. HEENT: Conjunctivae and lids unremarkable. Cardiovascular: Regular rhythm.  Lungs: Normal work of breathing. Neurologic: No focal deficits.   Lab Results  Component Value Date   CREATININE 0.52 (L) 06/20/2020   BUN 12 06/20/2020   NA 142 06/20/2020   K 4.0 06/20/2020   CL 102 06/20/2020   CO2 27 06/20/2020   Lab Results  Component Value Date   ALT 12 06/20/2020   AST 13 06/20/2020   ALKPHOS 50 06/20/2020   BILITOT <0.2 06/20/2020   Lab Results  Component Value Date   HGBA1C 7.0 (H) 06/20/2020   HGBA1C 7.6 09/02/2016   HGBA1C 6.9 10/21/2015   HGBA1C 7.8 (H) 06/04/2015   HGBA1C 7.4 (H) 03/19/2015   Lab Results  Component Value Date   TSH 0.286 (L) 06/20/2020   Lab Results  Component Value Date   CHOL 221 (H) 06/20/2020   HDL 78 06/20/2020   LDLCALC 128 (H) 06/20/2020   TRIG 89 06/20/2020   CHOLHDL 2.8 09/04/2016   Lab Results  Component Value Date   WBC 10.8 06/20/2020   HGB 13.1 06/20/2020   HCT 37.8 06/20/2020   MCV 89 06/20/2020   PLT 321 06/20/2020   Lab Results  Component Value Date   IRON 59 06/20/2020   TIBC 429 06/20/2020   FERRITIN 74 06/20/2020   Attestation Statements:   Reviewed by clinician on  day of visit: allergies, medications, problem list, medical history, surgical history, family history, social history, and previous encounter notes.  I, Water quality scientist, CMA, am acting as transcriptionist for Briscoe Deutscher, DO  I have reviewed the above documentation for accuracy and completeness, and I agree with the above. Briscoe Deutscher, DO

## 2020-07-11 MED FILL — JANUVIA 100 MG TABLET: 100 | 30 days supply | Qty: 30 | Fill #1

## 2020-07-11 MED FILL — glipiZIDE 5 MG TABS: 5 | 90 days supply | Qty: 90 | Fill #1

## 2020-07-19 ENCOUNTER — Ambulatory Visit: Payer: 59

## 2020-07-22 MED FILL — METFORMIN HCL ER 500 MG TB2: 500 | 90 days supply | Qty: 180 | Fill #1

## 2020-07-22 MED FILL — FLUTICASONE PROP 50 MCG SPR: 50 | 30 days supply | Qty: 16 | Fill #1

## 2020-07-22 MED FILL — PREMARIN 0.625 MG TABLET: 0.625 | 30 days supply | Qty: 30 | Fill #2

## 2020-07-22 MED FILL — LOSARTAN POTASSIUM 50 MG TA: 50 | 30 days supply | Qty: 30 | Fill #2

## 2020-07-24 DIAGNOSIS — E782 Mixed hyperlipidemia: Secondary | ICD-10-CM | POA: Diagnosis not present

## 2020-07-24 DIAGNOSIS — E559 Vitamin D deficiency, unspecified: Secondary | ICD-10-CM | POA: Diagnosis not present

## 2020-07-24 DIAGNOSIS — E1169 Type 2 diabetes mellitus with other specified complication: Secondary | ICD-10-CM | POA: Diagnosis not present

## 2020-07-24 DIAGNOSIS — Z6841 Body Mass Index (BMI) 40.0 and over, adult: Secondary | ICD-10-CM | POA: Diagnosis not present

## 2020-07-24 DIAGNOSIS — R946 Abnormal results of thyroid function studies: Secondary | ICD-10-CM | POA: Diagnosis not present

## 2020-07-29 ENCOUNTER — Ambulatory Visit (INDEPENDENT_AMBULATORY_CARE_PROVIDER_SITE_OTHER): Payer: 59 | Admitting: Family Medicine

## 2020-07-31 ENCOUNTER — Ambulatory Visit (INDEPENDENT_AMBULATORY_CARE_PROVIDER_SITE_OTHER): Payer: 59 | Admitting: Family Medicine

## 2020-07-31 ENCOUNTER — Encounter (INDEPENDENT_AMBULATORY_CARE_PROVIDER_SITE_OTHER): Payer: Self-pay

## 2020-08-12 MED FILL — AMLODIPINE BESYLATE 10 MG T: 10 | 90 days supply | Qty: 90 | Fill #0

## 2020-08-12 MED FILL — JANUVIA 100 MG TABLET: 100 | 30 days supply | Qty: 30 | Fill #2

## 2020-08-12 MED FILL — METOPROLOL SUCCINATE ER 100: 100 | 90 days supply | Qty: 90 | Fill #2

## 2020-08-16 DIAGNOSIS — E119 Type 2 diabetes mellitus without complications: Secondary | ICD-10-CM | POA: Diagnosis not present

## 2020-08-22 ENCOUNTER — Encounter (INDEPENDENT_AMBULATORY_CARE_PROVIDER_SITE_OTHER): Payer: Self-pay | Admitting: Family Medicine

## 2020-08-22 ENCOUNTER — Ambulatory Visit (INDEPENDENT_AMBULATORY_CARE_PROVIDER_SITE_OTHER): Payer: 59 | Admitting: Family Medicine

## 2020-08-22 ENCOUNTER — Other Ambulatory Visit: Payer: Self-pay

## 2020-08-22 VITALS — BP 149/72 | HR 68 | Temp 98.0°F | Ht 63.0 in | Wt 238.0 lb

## 2020-08-22 DIAGNOSIS — Z9189 Other specified personal risk factors, not elsewhere classified: Secondary | ICD-10-CM | POA: Diagnosis not present

## 2020-08-22 DIAGNOSIS — Z6841 Body Mass Index (BMI) 40.0 and over, adult: Secondary | ICD-10-CM | POA: Diagnosis not present

## 2020-08-22 DIAGNOSIS — I10 Essential (primary) hypertension: Secondary | ICD-10-CM

## 2020-08-22 DIAGNOSIS — E1159 Type 2 diabetes mellitus with other circulatory complications: Secondary | ICD-10-CM

## 2020-08-22 DIAGNOSIS — E1169 Type 2 diabetes mellitus with other specified complication: Secondary | ICD-10-CM | POA: Diagnosis not present

## 2020-08-22 DIAGNOSIS — E559 Vitamin D deficiency, unspecified: Secondary | ICD-10-CM

## 2020-08-22 MED FILL — LOSARTAN POTASSIUM 50 MG TA: 50 | 30 days supply | Qty: 30 | Fill #3

## 2020-08-22 MED FILL — FLUTICASONE PROP 50 MCG SPR: 50 | 30 days supply | Qty: 16 | Fill #2

## 2020-08-23 DIAGNOSIS — J343 Hypertrophy of nasal turbinates: Secondary | ICD-10-CM | POA: Diagnosis not present

## 2020-08-23 DIAGNOSIS — H903 Sensorineural hearing loss, bilateral: Secondary | ICD-10-CM | POA: Diagnosis not present

## 2020-08-23 DIAGNOSIS — H6123 Impacted cerumen, bilateral: Secondary | ICD-10-CM | POA: Diagnosis not present

## 2020-08-23 DIAGNOSIS — J342 Deviated nasal septum: Secondary | ICD-10-CM | POA: Diagnosis not present

## 2020-08-23 DIAGNOSIS — H6983 Other specified disorders of Eustachian tube, bilateral: Secondary | ICD-10-CM | POA: Diagnosis not present

## 2020-08-27 NOTE — Progress Notes (Signed)
Chief Complaint:   OBESITY [name] is here to discuss her progress with her obesity treatment plan along with follow-up of her obesity related diagnoses. [name] is on the Category 1 Plan and states she is following her eating plan approximately 25% of the time. [name] states she is exercising for 0 minutes 0 times per week.  Today's visit was #: 3 Starting weight: 243 lbs Starting date: 06/20/2020 Today's weight: 238 lbs Today's date: 08/22/2020 Total lbs lost to date: 5 lbs Total lbs lost since last in-office visit: 2 lbs Total weight loss percentage to date: -2.06%  Interim History: [name] has macular degeneration.  She has received her COVID vaccination.  Her blood sugar low is 87 and average is 100.  She has not started Trulicity yet.  Assessment/Plan:   1. Vitamin D deficiency Current vitamin D is 12.2, tested on 06/20/2020. Not at goal. Optimal goal > 50 ng/dL. There is also evidence to support a goal of >70 ng/dL in patients with cancer and heart disease. Plan: Continue Vitamin D @50 ,000 IU every week with follow-up for routine testing of Vitamin D at least 2-3 times per year to avoid over-replacement.  2. Type 2 diabetes mellitus with other specified complication, without long-term current use of insulin (HCC) Diabetes Mellitus: reasonably well controlled Issues reviewed with her: blood sugar goals, complications of diabetes mellitus, hypoglycemia prevention and treatment, exercise, nutrition, and carbohydrate counting.   Lab Results  Component Value Date   HGBA1C 7.0 (H) 06/20/2020   HGBA1C 7.6 09/02/2016   HGBA1C 6.9 10/21/2015   Lab Results  Component Value Date   LDLCALC 128 (H) 06/20/2020   CREATININE 0.52 (L) 06/20/2020   Health Maintenance BP goal < 130/80, LDL < 70, HDL > 40 and TG < 150. All patients with diabetes should be on a statin unless contraindicated. Dietary recommendations: < 150 g carbohydrates daily. Physical activity recommendations: as tolerated  aerobic and strength exercises.  3. Hypertension associated with type 2 diabetes mellitus ([place]) Not optimized. [name] is working on healthy weight loss and exercise to improve blood pressure control.   BP Readings from Last 3 Encounters:  08/22/20 (!) 149/72  07/04/20 (!) 153/73  06/20/20 (!) 161/74   4. At risk for heart disease [name] is at a higher than average risk for cardiovascular disease due to obesity and DM.  During insulin resistance, several metabolic alterations induce the development of cardiovascular disease. For instance, insulin resistance can induce an imbalance in glucose metabolism that generates chronic hyperglycemia, which in turn triggers oxidative stress and causes an inflammatory response that leads to cell damage. Insulin resistance can also alter systemic lipid metabolism which then leads to the development of dyslipidemia and the well-known lipid triad: (1) high levels of plasma triglycerides, (2) low levels of high-density lipoprotein, and (3) the appearance of small dense low-density lipoproteins. This triad, along with endothelial dysfunction, which can also be induced by aberrant insulin signaling, contribute to atherosclerotic plaque formation.   5. Class 3 severe obesity with serious comorbidity and body mass index (BMI) of 40.0 to 44.9 in adult, unspecified obesity type [name]) [name] is currently in the action stage of change. As such, her goal is to continue with weight loss efforts. She has agreed to the Category 1 Plan.   Exercise goals: Older adults should follow the adult guidelines. When older adults cannot meet the adult guidelines, they should be as physically active as their abilities and conditions will allow.  Older adults should do  exercises that maintain or improve balance if they are at risk of falling.   Behavioral modification strategies: increasing lean protein intake, decreasing simple carbohydrates and increasing vegetables.  [name] has  agreed to follow-up with our clinic in 2-3 weeks. She was informed of the importance of frequent follow-up visits to maximize her success with intensive lifestyle modifications for her multiple health conditions.   Objective:   Blood pressure (!) 149/72, pulse 68, temperature 98 F (36.7 C), temperature source Oral, height 5\' 3"  (1.6 m), weight 238 lb (108 kg), SpO2 97 %. Body mass index is 42.16 kg/m.  General: Cooperative, alert, well developed, in no acute distress. HEENT: Conjunctivae and lids unremarkable. Cardiovascular: Regular rhythm.  Lungs: Normal work of breathing. Neurologic: No focal deficits.   Lab Results  Component Value Date   CREATININE 0.52 (L) 06/20/2020   BUN 12 06/20/2020   NA 142 06/20/2020   K 4.0 06/20/2020   CL 102 06/20/2020   CO2 27 06/20/2020   Lab Results  Component Value Date   ALT 12 06/20/2020   AST 13 06/20/2020   ALKPHOS 50 06/20/2020   BILITOT <0.2 06/20/2020   Lab Results  Component Value Date   HGBA1C 7.0 (H) 06/20/2020   HGBA1C 7.6 09/02/2016   HGBA1C 6.9 10/21/2015   HGBA1C 7.8 (H) 06/04/2015   HGBA1C 7.4 (H) 03/19/2015   Lab Results  Component Value Date   TSH 0.286 (L) 06/20/2020   Lab Results  Component Value Date   CHOL 221 (H) 06/20/2020   HDL 78 06/20/2020   LDLCALC 128 (H) 06/20/2020   TRIG 89 06/20/2020   CHOLHDL 2.8 09/04/2016   Lab Results  Component Value Date   WBC 10.8 06/20/2020   HGB 13.1 06/20/2020   HCT 37.8 06/20/2020   MCV 89 06/20/2020   PLT 321 06/20/2020   Lab Results  Component Value Date   IRON 59 06/20/2020   TIBC 429 06/20/2020   FERRITIN 74 06/20/2020   Attestation Statements:   Reviewed by clinician on day of visit: allergies, medications, problem list, medical history, surgical history, family history, social history, and previous encounter notes.  I, Water quality scientist, CMA, am acting as transcriptionist for Briscoe Deutscher, DO  I have reviewed the above documentation for accuracy and  completeness, and I agree with the above. Briscoe Deutscher, DO

## 2020-09-06 ENCOUNTER — Other Ambulatory Visit: Payer: Self-pay

## 2020-09-06 ENCOUNTER — Encounter: Payer: Self-pay | Admitting: "Endocrinology

## 2020-09-06 ENCOUNTER — Ambulatory Visit (INDEPENDENT_AMBULATORY_CARE_PROVIDER_SITE_OTHER): Payer: 59 | Admitting: "Endocrinology

## 2020-09-06 VITALS — BP 138/64 | HR 72 | Ht 63.0 in | Wt 243.6 lb

## 2020-09-06 DIAGNOSIS — E1165 Type 2 diabetes mellitus with hyperglycemia: Secondary | ICD-10-CM | POA: Diagnosis not present

## 2020-09-06 DIAGNOSIS — I10 Essential (primary) hypertension: Secondary | ICD-10-CM | POA: Diagnosis not present

## 2020-09-06 DIAGNOSIS — E0590 Thyrotoxicosis, unspecified without thyrotoxic crisis or storm: Secondary | ICD-10-CM

## 2020-09-06 DIAGNOSIS — E559 Vitamin D deficiency, unspecified: Secondary | ICD-10-CM

## 2020-09-06 DIAGNOSIS — Z789 Other specified health status: Secondary | ICD-10-CM | POA: Diagnosis not present

## 2020-09-06 MED ORDER — VITAMIN D (ERGOCALCIFEROL) 1.25 MG (50000 UNIT) PO CAPS: 50000.0000 [IU] | ORAL_CAPSULE | ORAL | 0 refills | Status: DC

## 2020-09-06 MED FILL — VIT D2 1.25 MG (50,000 UNIT: 1.25 MG | 84 days supply | Qty: 12 | Fill #0

## 2020-09-06 NOTE — Progress Notes (Signed)
Endocrinology Consult Note       09/06/2020, 12:53 PM   Subjective:    Patient ID: [name], female    DOB: 19-Sep-1961.  Jordain Radin is being seen in consultation for management of currently uncontrolled symptomatic diabetes requested by  Redmond School, MD.   Past Medical History:  Diagnosis Date   Anxiety    Asthma    diagnosed as an adult   Back pain    Diabetes mellitus    Diverticulosis 01/13/2018   Gallbladder problem    Hypertension    Obesity     Past Surgical History:  Procedure Laterality Date   ABDOMINAL HYSTERECTOMY     CARPAL TUNNEL RELEASE     CESAREAN SECTION     CHOLECYSTECTOMY     SINUS EXPLORATION     TONSILLECTOMY     TRIGGER FINGER RELEASE     WRIST SURGERY      Social History   Socioeconomic History   Marital status: Single    Spouse name: Not on file   Number of children: Not on file   Years of education: Not on file   Highest education level: Not on file  Occupational History   Occupation: ER Patient Access Associate  Tobacco Use   Smoking status: Former Smoker    Types: Cigarettes    Quit date: 01/20/1989    Years since quitting: 31.6   Smokeless tobacco: Never Used  Vaping Use   Vaping Use: Never used  Substance and Sexual Activity   Alcohol use: No    Alcohol/week: 0.0 standard drinks   Drug use: No   Sexual activity: Yes    Birth control/protection: Surgical  Other Topics Concern   Not on file  Social History Narrative   Not on file   Social Determinants of Health   Financial Resource Strain:    Difficulty of Paying Living Expenses: Not on file  Food Insecurity:    Worried About Charity fundraiser in the Last Year: Not on file   [employer] of Food in the Last Year: Not on file  Transportation Needs:    Lack of Transportation (Medical): Not on file   Lack of Transportation  (Non-Medical): Not on file  Physical Activity:    Days of Exercise per Week: Not on file   Minutes of Exercise per Session: Not on file  Stress:    Feeling of Stress : Not on file  Social Connections:    Frequency of Communication with Friends and Family: Not on file   Frequency of Social Gatherings with Friends and Family: Not on file   Attends Religious Services: Not on file   Active Member of Clubs or Organizations: Not on file   Attends Archivist Meetings: Not on file   Marital Status: Not on file    Family History  Problem Relation Age of Onset   Hypertension Mother    Diabetes Mother    Stroke Mother     Outpatient Encounter Medications as of 09/06/2020  Medication Sig   doxycycline (DORYX) 100 MG EC tablet Take 100 mg by mouth daily. As  needed   acetaminophen (TYLENOL) 500 MG tablet Take 1,000 mg by mouth at bedtime as needed. For pain   albuterol (PROVENTIL HFA;VENTOLIN HFA) 108 (90 Base) MCG/ACT inhaler Inhale 1-2 puffs into the lungs every 6 (six) hours as needed for wheezing or shortness of breath.   ALPRAZolam (XANAX) 0.5 MG tablet Take 0.5 mg by mouth 2 (two) times daily as needed for anxiety.    amLODipine (NORVASC) 10 MG tablet Take 10 mg by mouth daily.   aspirin EC 81 MG tablet Take 81 mg by mouth daily.   azelastine (ASTELIN) 0.1 % nasal spray Place 2 sprays into both nostrils 2 (two) times daily. Use in each nostril as directed   EPINEPHrine 0.3 mg/0.3 mL IJ SOAJ injection    estrogens, conjugated, (PREMARIN) 0.625 MG tablet Take 0.625 mg by mouth daily.    fexofenadine (ALLEGRA) 180 MG tablet Take by mouth.   fluticasone (FLONASE) 50 MCG/ACT nasal spray USE 2 SPRAYS INTO BOTH NOSTRILS 2 TIMES DAILY AS NEEDED FOR ALLERGIES OR RHINITIS.   glipiZIDE (GLUCOTROL) 5 MG tablet Take 5 mg by mouth daily before breakfast.    losartan (COZAAR) 50 MG tablet Take 1 tablet (50 mg total) by mouth daily.   metFORMIN (GLUCOPHAGE) 500 MG  tablet Take 500 mg by mouth 2 (two) times daily.   metoprolol succinate (TOPROL-XL) 100 MG 24 hr tablet Take 1 tablet (100 mg total) by mouth daily.   ondansetron (ZOFRAN ODT) 4 MG disintegrating tablet Take 1 tablet (4 mg total) by mouth every 8 (eight) hours as needed for nausea or vomiting.   pantoprazole (PROTONIX) 40 MG tablet Take by mouth.   sitaGLIPtin (JANUVIA) 100 MG tablet Take 100 mg by mouth daily.   Vitamin D, Ergocalciferol, (DRISDOL) 1.25 MG (50000 UNIT) CAPS capsule Take 1 capsule (50,000 Units total) by mouth every 7 (seven) days.   [DISCONTINUED] Dulaglutide (TRULICITY) 5.10 CH/8.5ID SOPN Inject 0.5 mLs (0.75 mg total) into the skin once a week.   [DISCONTINUED] montelukast (SINGULAIR) 10 MG tablet Take 10 mg by mouth at bedtime.   [DISCONTINUED] potassium chloride (KLOR-CON) 10 MEQ tablet Take 1 tablet (10 mEq total) by mouth daily for 4 days.   [DISCONTINUED] ranitidine (ZANTAC) 150 MG tablet Take 150 mg by mouth daily.   [DISCONTINUED] traMADol (ULTRAM) 50 MG tablet Take 1 tablet (50 mg total) by mouth every 6 (six) hours as needed.   No facility-administered encounter medications on file as of 09/06/2020.    ALLERGIES: Allergies  Allergen Reactions   Barium-Containing Compounds Itching and Other (See Comments)    Weakness, loss of voice   Statins Other (See Comments)    Myalgia, muscle weakness   Sulfa Antibiotics Itching and Other (See Comments)    Weakness, loss of voice   Victoza [Liraglutide] Diarrhea and Nausea Only    VACCINATION STATUS: Immunization History  Administered Date(s) Administered   Influenza-Unspecified 08/29/2016    Diabetes She presents for her initial diabetic visit. She has type 2 diabetes mellitus. Onset time: She was diagnosed at approximate age of 69 years. Her disease course has been improving. There are no hypoglycemic associated symptoms. Pertinent negatives for hypoglycemia include no confusion, headaches, pallor or  seizures. There are no diabetic associated symptoms. Pertinent negatives for diabetes include no chest pain, no polydipsia, no polyphagia and no polyuria. There are no hypoglycemic complications. Symptoms are stable. Risk factors for coronary artery disease include dyslipidemia, diabetes mellitus, hypertension, obesity, post-menopausal and sedentary lifestyle. Current diabetic treatments: She is currently  on Metformin 500 mg p.o. twice daily, glipizide 5 mg p.o. daily, and Januvia 100 mg p.o. daily. She is compliant with treatment most of the time. Her weight is fluctuating minimally. She is following a generally unhealthy diet. When asked about meal planning, she reported none. She has had a previous visit with a dietitian. Her home blood glucose trend is decreasing steadily. Her breakfast blood glucose range is generally 140-180 mg/dl. Her bedtime blood glucose range is generally 180-200 mg/dl. Her overall blood glucose range is 180-200 mg/dl. (She brought a log showing controlled glycemic profile fasting and postprandial.  Her most recent A1c was 7%.  She has no hypoglycemia.) An ACE inhibitor/angiotensin II receptor blocker is being taken. Eye exam is current.  Hypertension This is a chronic problem. The current episode started more than 1 year ago. Pertinent negatives include no chest pain, headaches, palpitations or shortness of breath. Risk factors for coronary artery disease include dyslipidemia, diabetes mellitus, obesity, smoking/tobacco exposure, sedentary lifestyle, family history and post-menopausal state. Past treatments include angiotensin blockers.     Review of Systems  Constitutional: Negative for chills, fever and unexpected weight change.  HENT: Negative for trouble swallowing and voice change.   Eyes: Negative for visual disturbance.  Respiratory: Negative for cough, shortness of breath and wheezing.   Cardiovascular: Negative for chest pain, palpitations and leg swelling.   Gastrointestinal: Negative for diarrhea, nausea and vomiting.  Endocrine: Negative for cold intolerance, heat intolerance, polydipsia, polyphagia and polyuria.  Musculoskeletal: Negative for arthralgias and myalgias.  Skin: Negative for color change, pallor, rash and wound.  Neurological: Negative for seizures and headaches.  Psychiatric/Behavioral: Negative for confusion and suicidal ideas.    Objective:    Vitals with BMI 09/06/2020 08/22/2020 07/04/2020  Height 5\' 3"  5\' 3"  5\' 3"   Weight 243 lbs 10 oz 238 lbs 240 lbs  BMI 43.16 81.82 99.37  Systolic 169 678 938  Diastolic 64 72 73  Pulse 72 68 70  Some encounter information is confidential and restricted. Go to Review Flowsheets activity to see all data.    BP 138/64    Pulse 72    Ht 5\' 3"  (1.6 m)    Wt 243 lb 9.6 oz (110.5 kg)    BMI 43.15 kg/m   Wt Readings from Last 3 Encounters:  09/06/20 243 lb 9.6 oz (110.5 kg)  08/22/20 238 lb (108 kg)  07/04/20 (!) 240 lb (108.9 kg)     Physical Exam Constitutional:      Appearance: She is well-developed.  HENT:     Head: Normocephalic and atraumatic.  Neck:     Thyroid: No thyromegaly.     Trachea: No tracheal deviation.     Comments: She has palpable thyromegaly. Cardiovascular:     Rate and Rhythm: Normal rate and regular rhythm.  Pulmonary:     Effort: Pulmonary effort is normal.  Abdominal:     Tenderness: There is no abdominal tenderness. There is no guarding.  Musculoskeletal:        General: Normal range of motion.     Cervical back: Normal range of motion and neck supple.  Skin:    General: Skin is warm and dry.     Coloration: Skin is not pale.     Findings: No erythema or rash.  Neurological:     Mental Status: She is alert and oriented to person, place, and time.     Cranial Nerves: No cranial nerve deficit.     Coordination: Coordination normal.  Deep Tendon Reflexes: Reflexes are normal and symmetric.  Psychiatric:        Judgment: Judgment normal.        CMP ( most recent) CMP     Component Value Date/Time   NA 142 06/20/2020 1410   K 4.0 06/20/2020 1410   CL 102 06/20/2020 1410   CO2 27 06/20/2020 1410   GLUCOSE 96 06/20/2020 1410   GLUCOSE 117 (H) 12/28/2019 1047   BUN 12 06/20/2020 1410   CREATININE 0.52 (L) 06/20/2020 1410   CALCIUM 9.3 06/20/2020 1410   PROT 7.1 06/20/2020 1410   ALBUMIN 4.3 06/20/2020 1410   AST 13 06/20/2020 1410   ALT 12 06/20/2020 1410   ALKPHOS 50 06/20/2020 1410   BILITOT <0.2 06/20/2020 1410   GFRNONAA 103 06/20/2020 1410   GFRAA 119 06/20/2020 1410     Diabetic Labs (most recent): Lab Results  Component Value Date   HGBA1C 7.0 (H) 06/20/2020   HGBA1C 7.6 09/02/2016   HGBA1C 6.9 10/21/2015     Lipid Panel ( most recent) Lipid Panel     Component Value Date/Time   CHOL 221 (H) 06/20/2020 1410   TRIG 89 06/20/2020 1410   HDL 78 06/20/2020 1410   CHOLHDL 2.8 09/04/2016 0700   VLDL 41 (H) 09/04/2016 0700   LDLCALC 128 (H) 06/20/2020 1410   LABVLDL 15 06/20/2020 1410      Lab Results  Component Value Date   TSH 0.286 (L) 06/20/2020   TSH 0.236 (L) 03/19/2015   FREET4 1.26 06/20/2020           Assessment & Plan:   1. Subclinical hyperthyroidism -I have reviewed her previously documented thyroid function tests and all are consistent with subclinical hyperthyroidism.  Patient is not symptomatic.  She will not need immediate antithyroid intervention at this time.  She will need repeat full profile thyroid function tests as well as thyroid ultrasound in 6 months with office visit.   2. Vitamin D deficiency -She has profound vitamin D deficiency.  A new prescription for vitamin D2 50,000 units weekly for 12 weeks is prescribed for her.  3.  Type 2 diabetes -Since age of 45 years. -Currently controlled with A1c of 7%. - I had a long discussion with her about the progressive nature of diabetes and the pathology behind its complications. -her diabetes is complicated by  obesity/sedentary life and she remains at a high risk for more acute and chronic complications which include CAD, CVA, CKD, retinopathy, and neuropathy. These are all discussed in detail with her.  - I have counseled her on diet  and weight management  by adopting a carbohydrate restricted/protein rich diet. Patient is encouraged to switch to  unprocessed or minimally processed     complex starch and increased protein intake (animal or plant source), fruits, and vegetables. -  she is advised to stick to a routine mealtimes to eat 3 meals  a day and avoid unnecessary snacks ( to snack only to correct hypoglycemia).   - she admits that there is a room for improvement in her food and drink choices. - Suggestion is made for her to avoid simple carbohydrates  from her diet including Cakes, Sweet Desserts, Ice Cream, Soda (diet and regular), Sweet Tea, Candies, Chips, Cookies, Store Bought Juices, Alcohol in Excess of  1-2 drinks a day, Artificial Sweeteners,  Coffee Creamer, and "Sugar-free" Products. This will help patient to have more stable blood glucose profile and potentially avoid unintended weight gain.  -  she will be scheduled with Jearld Fenton, RDN, CDE for diabetes education.  - I have approached her with the following individualized plan to manage  her diabetes and patient agrees:   - she will not need insulin intervention at this time.  She is advised to continue Metformin 500 mg p.o. twice daily, finish her Januvia 100 mg p.o. daily and switch to Trulicity 4.65 mg subcutaneously weekly.  She would also benefit from low-dose glipizide, advised to continue glipizide 5 mg XL p.o. daily at breakfast.    - Specific targets for  A1c;  LDL, HDL,  and Triglycerides were discussed with the patient.  4) Blood Pressure /Hypertension:  her blood pressure is  controlled to target.   she is advised to continue her current medications including losartan 50 mg p.o. daily with breakfast .  5)  Lipids/Hyperlipidemia:   Review of her recent lipid panel showed un controlled  LDL at 128 .  she has statin intolerance.  She will be considered for Zetia on subsequent visits.  She is advised on regular exercise.     6)  Weight/Diet:  Body mass index is 43.15 kg/m.  -   clearly complicating her diabetes care.   she is  a candidate for weight loss. I discussed with her the fact that loss of 5 - 10% of her  current body weight will have the most impact on her diabetes management.  Exercise, and detailed carbohydrates information provided  -  detailed on discharge instructions.  7) Chronic Care/Health Maintenance:  -she  is ARB  medications and  is encouraged to initiate and continue to follow up with Ophthalmology, Dentist,  Podiatrist at least yearly or according to recommendations, and advised to   stay away from smoking. I have recommended yearly flu vaccine and pneumonia vaccine at least every 5 years; moderate intensity exercise for up to 150 minutes weekly; and  sleep for at least 7 hours a day.  - she is  advised to maintain close follow up with Redmond School, MD for primary care needs, as well as her other providers for optimal and coordinated care.   - Time spent in this patient care: 60 min, of which > 50% was spent in  counseling  her about her subclinical hyperthyroidism, type 2 diabetes, hyperlipidemia, vitamin D deficiency, hypertension and the rest reviewing her blood glucose logs , discussing her hypoglycemia and hyperglycemia episodes, reviewing her current and  previous labs / studies  ( including abstraction from other facilities) and medications  doses and developing a  long term treatment plan based on the latest standards of care/ guidelines; and documenting her care.    Please refer to Patient Instructions for Blood Glucose Monitoring and Insulin/Medications Dosing Guide"  in media tab for additional information. Please  also refer to " Patient Self Inventory" in the Media   tab for reviewed elements of pertinent patient history.  Iran Sizer participated in the discussions, expressed understanding, and voiced agreement with the above plans.  All questions were answered to her satisfaction. she is encouraged to contact clinic should she have any questions or concerns prior to her return visit.   Follow up plan: - Return in about 6 months (around 03/06/2021) for F/U with Pre-visit Labs, Thyroid / Neck Ultrasound.  Glade Lloyd, MD [facility] [address] [place], [place] [address] Phone: [phone]  Fax: [phone]    09/06/2020, 12:53 PM  This note was partially dictated with  voice recognition software. Similar sounding words can be transcribed inadequately or may not  be corrected upon review.

## 2020-09-06 NOTE — Patient Instructions (Signed)

## 2020-09-09 ENCOUNTER — Other Ambulatory Visit (HOSPITAL_COMMUNITY): Payer: Self-pay | Admitting: Internal Medicine

## 2020-09-09 MED FILL — PREMARIN 0.625 MG TABLET: 0.625 | 30 days supply | Qty: 30 | Fill #3

## 2020-09-09 MED FILL — PANTOPRAZOLE SOD DR 40 MG T: 40 | 90 days supply | Qty: 90 | Fill #0

## 2020-09-12 ENCOUNTER — Encounter (INDEPENDENT_AMBULATORY_CARE_PROVIDER_SITE_OTHER): Payer: Self-pay

## 2020-09-12 ENCOUNTER — Ambulatory Visit (INDEPENDENT_AMBULATORY_CARE_PROVIDER_SITE_OTHER): Payer: 59 | Admitting: Family Medicine

## 2020-09-12 ENCOUNTER — Other Ambulatory Visit: Payer: Self-pay

## 2020-09-18 ENCOUNTER — Other Ambulatory Visit (HOSPITAL_COMMUNITY): Payer: Self-pay | Admitting: Family Medicine

## 2020-09-18 DIAGNOSIS — Z6841 Body Mass Index (BMI) 40.0 and over, adult: Secondary | ICD-10-CM | POA: Diagnosis not present

## 2020-09-18 DIAGNOSIS — J302 Other seasonal allergic rhinitis: Secondary | ICD-10-CM | POA: Diagnosis not present

## 2020-09-18 DIAGNOSIS — F419 Anxiety disorder, unspecified: Secondary | ICD-10-CM | POA: Diagnosis not present

## 2020-09-23 MED FILL — FLUTICASONE PROP 50 MCG SPR: 50 | 30 days supply | Qty: 16 | Fill #3

## 2020-09-23 MED FILL — LOSARTAN POTASSIUM 50 MG TA: 50 | 30 days supply | Qty: 30 | Fill #4

## 2020-09-27 ENCOUNTER — Other Ambulatory Visit: Payer: Self-pay | Admitting: Physician Assistant

## 2020-09-27 DIAGNOSIS — L669 Cicatricial alopecia, unspecified: Secondary | ICD-10-CM | POA: Diagnosis not present

## 2020-09-27 DIAGNOSIS — D485 Neoplasm of uncertain behavior of skin: Secondary | ICD-10-CM | POA: Diagnosis not present

## 2020-09-27 DIAGNOSIS — L668 Other cicatricial alopecia: Secondary | ICD-10-CM | POA: Diagnosis not present

## 2020-09-30 MED FILL — glipiZIDE 5 MG TABS: 5 | 90 days supply | Qty: 90 | Fill #2

## 2020-09-30 MED FILL — JANUVIA 100 MG TABLET: 100 | 30 days supply | Qty: 30 | Fill #3

## 2020-10-04 MED FILL — ALBUTEROL SULFATE HFA 108 (: 108 (90 BAS | 17 days supply | Qty: 18 | Fill #0

## 2020-10-10 ENCOUNTER — Ambulatory Visit (INDEPENDENT_AMBULATORY_CARE_PROVIDER_SITE_OTHER): Payer: 59 | Admitting: Family Medicine

## 2020-10-11 ENCOUNTER — Other Ambulatory Visit (HOSPITAL_COMMUNITY): Payer: Self-pay | Admitting: Physician Assistant

## 2020-10-11 DIAGNOSIS — L080 Pyoderma: Secondary | ICD-10-CM | POA: Diagnosis not present

## 2020-10-11 MED FILL — DOXYCYCLINE HYCLATE 100 MG: 100 | 30 days supply | Qty: 60 | Fill #0

## 2020-10-23 MED FILL — PREMARIN 0.625 MG TABLET: 0.625 | 30 days supply | Qty: 30 | Fill #4

## 2020-10-23 MED FILL — LOSARTAN POTASSIUM 50 MG TA: 50 | 30 days supply | Qty: 30 | Fill #5

## 2020-10-29 MED FILL — METFORMIN HCL ER 500 MG TB2: 500 | 90 days supply | Qty: 180 | Fill #0

## 2020-10-29 MED FILL — FLUTICASONE PROP 50 MCG SPR: 50 | 30 days supply | Qty: 16 | Fill #4

## 2020-10-30 ENCOUNTER — Encounter (INDEPENDENT_AMBULATORY_CARE_PROVIDER_SITE_OTHER): Payer: Self-pay | Admitting: Family Medicine

## 2020-10-30 ENCOUNTER — Other Ambulatory Visit: Payer: Self-pay

## 2020-10-30 ENCOUNTER — Ambulatory Visit (INDEPENDENT_AMBULATORY_CARE_PROVIDER_SITE_OTHER): Payer: 59 | Admitting: Family Medicine

## 2020-10-30 VITALS — BP 154/73 | HR 76 | Temp 98.3°F | Ht 63.0 in | Wt 238.0 lb

## 2020-10-30 DIAGNOSIS — Z9189 Other specified personal risk factors, not elsewhere classified: Secondary | ICD-10-CM | POA: Diagnosis not present

## 2020-10-30 DIAGNOSIS — Z6841 Body Mass Index (BMI) 40.0 and over, adult: Secondary | ICD-10-CM | POA: Diagnosis not present

## 2020-10-30 DIAGNOSIS — G478 Other sleep disorders: Secondary | ICD-10-CM

## 2020-10-30 DIAGNOSIS — G4726 Circadian rhythm sleep disorder, shift work type: Secondary | ICD-10-CM | POA: Diagnosis not present

## 2020-10-30 DIAGNOSIS — E0590 Thyrotoxicosis, unspecified without thyrotoxic crisis or storm: Secondary | ICD-10-CM

## 2020-10-30 DIAGNOSIS — E1169 Type 2 diabetes mellitus with other specified complication: Secondary | ICD-10-CM | POA: Diagnosis not present

## 2020-10-30 NOTE — Progress Notes (Signed)
Chief Complaint:   OBESITY [name] is here to discuss her progress with her obesity treatment plan along with follow-up of her obesity related diagnoses.   Today's visit was #: 4 Starting weight: 243 lbs Starting date: 06/20/2020 Today's weight: 238 lbs Today's date: 10/30/2020 Total lbs lost to date: 5 lbs Body mass index is 42.16 kg/m.  Total weight loss percentage to date: -2.06%  Interim History: [name]'s last visit was on 08/22/2020.  I have reviewed her Cardiology and Endocrinology notes. Nutrition Plan: the Category 1 Plan for 30% of the time.  Anti-obesity medications: metformin 500 mg twice daily. Reported side effects: None. Hunger is moderately controlled controlled. Cravings are poorly controlled controlled.  Activity: None.  Assessment/Plan:   1. Subclinical hyperthyroidism [name] is being followed by Endocrinology.  Thyroid US has been ordered. We will continue to monitor symptoms as they relate to her weight loss journey.  2. Type 2 diabetes mellitus with other specified complication, without long-term current use of insulin [name] endorses sugar cravings.  She is taking glipizide Januvia, and metformin.  Blood sugars are well-controlled at 90-120.  Denies hypoglycemia.  Plan:  Start Trulicity 4.74 subcutaneously weekly (already has at home).  Stop Januvia.  Stop glipizide if blood sugar is under 100 consistently.  3. Shift work sleep disorder [name] works 3rd shift. Shift work refers to non-standard work schedules, including permanent or intermittent night work, early morning work, and Photographer. Shift work disorder is the development of sleep disturbances and impairment of waking alertness and performance.   Plan: Recommend sleep hygiene measures including regular sleep schedule, optimal sleep environment, and relaxing presleep rituals.   4. At risk for hypoglycemia [name] was given approximately 15 minutes of counseling today regarding prevention of  hypoglycemia. She was advised of symptoms of hypoglycemia. [name] was instructed to avoid skipping meals, eat regular protein rich meals and schedule low calorie snacks as needed.   5. Class 3 severe obesity with serious comorbidity and body mass index (BMI) of 40.0 to 44.9 in adult, unspecified obesity type ([name])  Course: [name] is currently in the action stage of change. As such, her goal is to continue with weight loss efforts.   Nutrition goals: She has agreed to keeping a food journal and adhering to recommended goals of 1000-1200 calories and 85 grams of protein.   Exercise goals: Older adults should follow the adult guidelines. When older adults cannot meet the adult guidelines, they should be as physically active as their abilities and conditions will allow.  Older adults should do exercises that maintain or improve balance if they are at risk of falling.   Behavioral modification strategies: increasing lean protein intake, decreasing simple carbohydrates and increasing vegetables.  [name] has agreed to follow-up with our clinic in 3 weeks. She was informed of the importance of frequent follow-up visits to maximize her success with intensive lifestyle modifications for her multiple health conditions.   Objective:   Blood pressure (!) 154/73, pulse 76, temperature 98.3 F (36.8 C), height 5\' 3"  (1.6 m), weight 238 lb (108 kg), SpO2 96 %. Body mass index is 42.16 kg/m.  General: Cooperative, alert, well developed, in no acute distress. HEENT: Conjunctivae and lids unremarkable. Cardiovascular: Regular rhythm.  Lungs: Normal work of breathing. Neurologic: No focal deficits.   Lab Results  Component Value Date   CREATININE 0.52 (L) 06/20/2020   BUN 12 06/20/2020   NA 142 06/20/2020   K 4.0 06/20/2020   CL 102 06/20/2020  CO2 27 06/20/2020   Lab Results  Component Value Date   ALT 12 06/20/2020   AST 13 06/20/2020   ALKPHOS 50 06/20/2020   BILITOT <0.2 06/20/2020   Lab  Results  Component Value Date   HGBA1C 7.0 (H) 06/20/2020   HGBA1C 7.6 09/02/2016   HGBA1C 6.9 10/21/2015   HGBA1C 7.8 (H) 06/04/2015   HGBA1C 7.4 (H) 03/19/2015   Lab Results  Component Value Date   TSH 0.286 (L) 06/20/2020   Lab Results  Component Value Date   CHOL 221 (H) 06/20/2020   HDL 78 06/20/2020   LDLCALC 128 (H) 06/20/2020   TRIG 89 06/20/2020   CHOLHDL 2.8 09/04/2016   Lab Results  Component Value Date   WBC 10.8 06/20/2020   HGB 13.1 06/20/2020   HCT 37.8 06/20/2020   MCV 89 06/20/2020   PLT 321 06/20/2020   Lab Results  Component Value Date   IRON 59 06/20/2020   TIBC 429 06/20/2020   FERRITIN 74 06/20/2020   Attestation Statements:   Reviewed by clinician on day of visit: allergies, medications, problem list, medical history, surgical history, family history, social history, and previous encounter notes.  I, Water quality scientist, CMA, am acting as transcriptionist for Briscoe Deutscher, DO  I have reviewed the above documentation for accuracy and completeness, and I agree with the above. Briscoe Deutscher, DO

## 2020-11-04 DIAGNOSIS — J302 Other seasonal allergic rhinitis: Secondary | ICD-10-CM | POA: Diagnosis not present

## 2020-11-04 DIAGNOSIS — E118 Type 2 diabetes mellitus with unspecified complications: Secondary | ICD-10-CM | POA: Diagnosis not present

## 2020-11-04 DIAGNOSIS — Z6841 Body Mass Index (BMI) 40.0 and over, adult: Secondary | ICD-10-CM | POA: Diagnosis not present

## 2020-11-04 NOTE — Telephone Encounter (Signed)
Changed pharmacy

## 2020-11-11 MED FILL — AMLODIPINE BESYLATE 10 MG T: 10 | 90 days supply | Qty: 90 | Fill #0

## 2020-11-18 DIAGNOSIS — K219 Gastro-esophageal reflux disease without esophagitis: Secondary | ICD-10-CM | POA: Diagnosis not present

## 2020-11-18 DIAGNOSIS — Z6841 Body Mass Index (BMI) 40.0 and over, adult: Secondary | ICD-10-CM | POA: Diagnosis not present

## 2020-11-18 DIAGNOSIS — M1991 Primary osteoarthritis, unspecified site: Secondary | ICD-10-CM | POA: Diagnosis not present

## 2020-11-18 DIAGNOSIS — I10 Essential (primary) hypertension: Secondary | ICD-10-CM | POA: Diagnosis not present

## 2020-11-18 DIAGNOSIS — E118 Type 2 diabetes mellitus with unspecified complications: Secondary | ICD-10-CM | POA: Diagnosis not present

## 2020-11-21 ENCOUNTER — Ambulatory Visit (INDEPENDENT_AMBULATORY_CARE_PROVIDER_SITE_OTHER): Payer: 59 | Admitting: Family Medicine

## 2020-11-21 MED FILL — LOSARTAN POTASSIUM 50 MG TA: 50 | 30 days supply | Qty: 30 | Fill #6

## 2020-11-21 MED FILL — METOPROLOL SUCCINATE ER 100: 100 | 90 days supply | Qty: 90 | Fill #3

## 2020-11-21 MED FILL — PREMARIN 0.625 MG TABLET: 0.625 | 30 days supply | Qty: 30 | Fill #5

## 2020-11-28 DIAGNOSIS — J069 Acute upper respiratory infection, unspecified: Secondary | ICD-10-CM | POA: Diagnosis not present

## 2020-11-28 DIAGNOSIS — Z6841 Body Mass Index (BMI) 40.0 and over, adult: Secondary | ICD-10-CM | POA: Diagnosis not present

## 2020-11-29 DIAGNOSIS — L720 Epidermal cyst: Secondary | ICD-10-CM | POA: Diagnosis not present

## 2020-11-29 DIAGNOSIS — L668 Other cicatricial alopecia: Secondary | ICD-10-CM | POA: Diagnosis not present

## 2020-12-05 DIAGNOSIS — R519 Headache, unspecified: Secondary | ICD-10-CM | POA: Diagnosis not present

## 2020-12-05 DIAGNOSIS — J343 Hypertrophy of nasal turbinates: Secondary | ICD-10-CM | POA: Diagnosis not present

## 2020-12-05 DIAGNOSIS — J310 Chronic rhinitis: Secondary | ICD-10-CM | POA: Diagnosis not present

## 2020-12-05 DIAGNOSIS — J342 Deviated nasal septum: Secondary | ICD-10-CM | POA: Diagnosis not present

## 2020-12-05 DIAGNOSIS — H6123 Impacted cerumen, bilateral: Secondary | ICD-10-CM | POA: Diagnosis not present

## 2020-12-05 DIAGNOSIS — H900 Conductive hearing loss, bilateral: Secondary | ICD-10-CM | POA: Diagnosis not present

## 2020-12-10 ENCOUNTER — Other Ambulatory Visit (HOSPITAL_COMMUNITY): Payer: Self-pay | Admitting: Internal Medicine

## 2020-12-10 MED FILL — PANTOPRAZOLE SOD DR 40 MG T: 40 | 90 days supply | Qty: 90 | Fill #1

## 2020-12-10 MED FILL — DOXYCYCLINE HYCLATE 100 MG: 100 | 30 days supply | Qty: 60 | Fill #1

## 2020-12-10 MED FILL — JANUVIA 100 MG TABLET: 100 | 30 days supply | Qty: 30 | Fill #0

## 2020-12-18 ENCOUNTER — Ambulatory Visit (INDEPENDENT_AMBULATORY_CARE_PROVIDER_SITE_OTHER): Payer: 59 | Admitting: Family Medicine

## 2020-12-24 ENCOUNTER — Other Ambulatory Visit (HOSPITAL_COMMUNITY): Payer: Self-pay | Admitting: Obstetrics and Gynecology

## 2020-12-24 MED FILL — LOSARTAN POTASSIUM 50 MG TA: 50 | 30 days supply | Qty: 30 | Fill #7

## 2020-12-24 MED FILL — PREMARIN 0.625 MG TABLET: 0.625 | 30 days supply | Qty: 30 | Fill #0

## 2021-01-03 ENCOUNTER — Other Ambulatory Visit (HOSPITAL_COMMUNITY): Payer: Self-pay | Admitting: Internal Medicine

## 2021-01-03 DIAGNOSIS — H6503 Acute serous otitis media, bilateral: Secondary | ICD-10-CM | POA: Diagnosis not present

## 2021-01-03 DIAGNOSIS — E119 Type 2 diabetes mellitus without complications: Secondary | ICD-10-CM | POA: Diagnosis not present

## 2021-01-03 DIAGNOSIS — Z6841 Body Mass Index (BMI) 40.0 and over, adult: Secondary | ICD-10-CM | POA: Diagnosis not present

## 2021-01-03 DIAGNOSIS — I10 Essential (primary) hypertension: Secondary | ICD-10-CM | POA: Diagnosis not present

## 2021-01-03 DIAGNOSIS — M1991 Primary osteoarthritis, unspecified site: Secondary | ICD-10-CM | POA: Diagnosis not present

## 2021-01-03 MED FILL — LOSARTAN POTASSIUM 100 MG T: 100 | 30 days supply | Qty: 30 | Fill #0

## 2021-01-06 ENCOUNTER — Other Ambulatory Visit (HOSPITAL_COMMUNITY): Payer: Self-pay | Admitting: Internal Medicine

## 2021-01-06 MED FILL — glipiZIDE 5 MG TABS: 5 | 90 days supply | Qty: 90 | Fill #0

## 2021-01-07 ENCOUNTER — Other Ambulatory Visit (HOSPITAL_COMMUNITY): Payer: Self-pay

## 2021-01-07 MED FILL — OMRON 3 SERIES BP MONITOR D: 1 days supply | Qty: 1 | Fill #0

## 2021-01-14 MED FILL — ALBUTEROL SULFATE HFA 108 (: 108 (90 BAS | 17 days supply | Qty: 18 | Fill #1

## 2021-01-15 ENCOUNTER — Other Ambulatory Visit (HOSPITAL_COMMUNITY): Payer: Self-pay | Admitting: Internal Medicine

## 2021-01-15 ENCOUNTER — Ambulatory Visit (INDEPENDENT_AMBULATORY_CARE_PROVIDER_SITE_OTHER): Payer: 59 | Admitting: Family Medicine

## 2021-01-15 ENCOUNTER — Other Ambulatory Visit: Payer: Self-pay

## 2021-01-15 ENCOUNTER — Encounter (INDEPENDENT_AMBULATORY_CARE_PROVIDER_SITE_OTHER): Payer: Self-pay | Admitting: Family Medicine

## 2021-01-15 VITALS — BP 144/74 | HR 63 | Temp 98.0°F | Ht 63.0 in | Wt 243.0 lb

## 2021-01-15 DIAGNOSIS — Z6841 Body Mass Index (BMI) 40.0 and over, adult: Secondary | ICD-10-CM

## 2021-01-15 DIAGNOSIS — E785 Hyperlipidemia, unspecified: Secondary | ICD-10-CM

## 2021-01-15 DIAGNOSIS — I10 Essential (primary) hypertension: Secondary | ICD-10-CM | POA: Diagnosis not present

## 2021-01-15 DIAGNOSIS — F3289 Other specified depressive episodes: Secondary | ICD-10-CM

## 2021-01-15 DIAGNOSIS — E1169 Type 2 diabetes mellitus with other specified complication: Secondary | ICD-10-CM | POA: Diagnosis not present

## 2021-01-15 DIAGNOSIS — G4726 Circadian rhythm sleep disorder, shift work type: Secondary | ICD-10-CM

## 2021-01-15 DIAGNOSIS — E66813 Obesity, class 3: Secondary | ICD-10-CM

## 2021-01-15 MED FILL — METFORMIN HCL ER 500 MG TB2: 500 | 90 days supply | Qty: 180 | Fill #0

## 2021-01-15 MED FILL — JANUVIA 100 MG TABLET: 100 | 30 days supply | Qty: 30 | Fill #1

## 2021-01-16 ENCOUNTER — Other Ambulatory Visit (INDEPENDENT_AMBULATORY_CARE_PROVIDER_SITE_OTHER): Payer: Self-pay | Admitting: Family Medicine

## 2021-01-16 MED ORDER — RYBELSUS 3 MG PO TABS: 3.0000 mg | ORAL_TABLET | Freq: Every day | ORAL | 0 refills | Status: DC

## 2021-01-16 MED FILL — RYBELSUS 3 MG TABS: 3 | 30 days supply | Qty: 30 | Fill #0

## 2021-01-17 ENCOUNTER — Ambulatory Visit (HOSPITAL_COMMUNITY): Admission: RE | Admit: 2021-01-17 | Payer: 59 | Source: Ambulatory Visit

## 2021-01-17 MED FILL — PREMARIN 0.625 MG TABLET: 0.625 | 30 days supply | Qty: 30 | Fill #1

## 2021-01-20 NOTE — Progress Notes (Signed)
Chief Complaint:   OBESITY [name] is here to discuss her progress with her obesity treatment plan along with follow-up of her obesity related diagnoses.   Today's visit was #: 5 Starting weight: 243 lbs Starting date: 06/20/2020 Today's weight: 243 lbs Today's date: 01/15/2021 Total lbs lost to date: 0 Body mass index is 43.05 kg/m.   Interim History: [name]'s last visit was on 10/30/2020.  She reports that Trulicity caused too much nausea.  She is taking Januvia, metformin, and glipizide. Her A1c was 6.7 at her PCP's office.  Her average fasting blood sugar is 100-120.  She has been eating overnight.   Nutrition Plan: keeping a food journal and adhering to recommended goals of 1000-1200 calories and 85 grams of protein for 10-15% of the time . Activity: None at this time.  Assessment/Plan:   1. Type 2 diabetes mellitus with other specified complication, without long-term current use of insulin (HCC) Diabetes Mellitus: Not at goal. Medication: Januvia 100 mg daily, metformin 500 mg twice daily. Issues reviewed: blood sugar goals, complications of diabetes mellitus, hypoglycemia prevention and treatment, exercise, and nutrition. Plan: The importance of regular follow up with PCP and all other specialists as scheduled was stressed to patient today.  Will start Rybelsus 3 mg daily, as per below.  Stop glipizide.  - Start Semaglutide (RYBELSUS) 3 MG TABS; Take 3 mg by mouth daily.  Dispense: 30 tablet; Refill: 0  Lab Results  Component Value Date   HGBA1C 7.0 (H) 06/20/2020   HGBA1C 7.6 09/02/2016   HGBA1C 6.9 10/21/2015   Lab Results  Component Value Date   LDLCALC 128 (H) 06/20/2020   CREATININE 0.52 (L) 06/20/2020   2. Shift work sleep disorder Shift work refers to non-standard work schedules, including permanent or intermittent night work, early morning work, and Photographer. Shift work disorder is the development of sleep disturbances and impairment of waking  alertness and performance. We will continue to monitor symptoms as they relate to her weight loss journey.  3. Essential hypertension Not at goal. Medications: losartan was increased to 100 mg daily.   Plan: Avoid buying foods that are: processed, frozen, or prepackaged to avoid excess salt. We will continue to monitor closely alongside her PCP and/or Specialist.  Regular follow up with PCP and specialists was also encouraged.   BP Readings from Last 3 Encounters:  01/15/21 (!) 144/74  10/30/20 (!) 154/73  09/06/20 138/64   Lab Results  Component Value Date   CREATININE 0.52 (L) 06/20/2020   4. Hyperlipidemia associated with type 2 diabetes mellitus ([place]) Course: Not at goal.. Lipid-lowering medications: None.  She was unable to tolerate Crestor.   Plan: Dietary changes: Increase soluble fiber, decrease simple carbohydrates, decrease saturated fat. Exercise changes: Moderate to vigorous-intensity aerobic activity 150 minutes per week or as tolerated. We will continue to monitor along with PCP/specialists as it pertains to her weight loss journey.  Lab Results  Component Value Date   CHOL 221 (H) 06/20/2020   HDL 78 06/20/2020   LDLCALC 128 (H) 06/20/2020   TRIG 89 06/20/2020   CHOLHDL 2.8 09/04/2016   Lab Results  Component Value Date   ALT 12 06/20/2020   AST 13 06/20/2020   ALKPHOS 50 06/20/2020   BILITOT <0.2 06/20/2020   The 10-year ASCVD risk score Mikey Bussing DC Jr., et al., 2013) is: 20.6%   Values used to calculate the score:     Age: 60 years     Sex: Female  Is Non-Hispanic African American: Yes     Diabetic: Yes     Tobacco smoker: No     Systolic Blood Pressure: 606 mmHg     Is BP treated: Yes     HDL Cholesterol: 78 mg/dL     Total Cholesterol: 221 mg/dL  5. Other depression, with emotional eating Not at goal.  Patient was referred to Dr. Mallie Mussel, our Bariatric Psychologist, for evaluation due to her elevated PHQ-9 score and significant struggles with  emotional eating.  6. Class 3 severe obesity with serious comorbidity and body mass index (BMI) of 40.0 to 44.9 in adult, unspecified obesity type ([place])  Course: [name] is currently in the action stage of change. As such, her goal is to continue with weight loss efforts.   Nutrition goals: She has agreed to Weight Watchers Blue 1200 calories.   Exercise goals: For substantial health benefits, adults should do at least 150 minutes (2 hours and 30 minutes) a week of moderate-intensity, or 75 minutes (1 hour and 15 minutes) a week of vigorous-intensity aerobic physical activity, or an equivalent combination of moderate- and vigorous-intensity aerobic activity. Aerobic activity should be performed in episodes of at least 10 minutes, and preferably, it should be spread throughout the week.  Behavioral modification strategies: increasing lean protein intake, decreasing simple carbohydrates, increasing vegetables, increasing water intake, decreasing liquid calories and decreasing alcohol intake.  [name] has agreed to follow-up with our clinic in 3 weeks. She was informed of the importance of frequent follow-up visits to maximize her success with intensive lifestyle modifications for her multiple health conditions.   Objective:   Blood pressure (!) 144/74, pulse 63, temperature 98 F (36.7 C), temperature source Oral, height 5\' 3"  (1.6 m), weight 243 lb (110.2 kg), SpO2 95 %. Body mass index is 43.05 kg/m.  General: Cooperative, alert, well developed, in no acute distress. HEENT: Conjunctivae and lids unremarkable. Cardiovascular: Regular rhythm.  Lungs: Normal work of breathing. Neurologic: No focal deficits.   Lab Results  Component Value Date   CREATININE 0.52 (L) 06/20/2020   BUN 12 06/20/2020   NA 142 06/20/2020   K 4.0 06/20/2020   CL 102 06/20/2020   CO2 27 06/20/2020   Lab Results  Component Value Date   ALT 12 06/20/2020   AST 13 06/20/2020   ALKPHOS 50 06/20/2020   BILITOT  <0.2 06/20/2020   Lab Results  Component Value Date   HGBA1C 7.0 (H) 06/20/2020   HGBA1C 7.6 09/02/2016   HGBA1C 6.9 10/21/2015   HGBA1C 7.8 (H) 06/04/2015   HGBA1C 7.4 (H) 03/19/2015   Lab Results  Component Value Date   TSH 0.286 (L) 06/20/2020   Lab Results  Component Value Date   CHOL 221 (H) 06/20/2020   HDL 78 06/20/2020   LDLCALC 128 (H) 06/20/2020   TRIG 89 06/20/2020   CHOLHDL 2.8 09/04/2016   Lab Results  Component Value Date   WBC 10.8 06/20/2020   HGB 13.1 06/20/2020   HCT 37.8 06/20/2020   MCV 89 06/20/2020   PLT 321 06/20/2020   Lab Results  Component Value Date   IRON 59 06/20/2020   TIBC 429 06/20/2020   FERRITIN 74 06/20/2020   Obesity Behavioral Intervention:   Approximately 15 minutes were spent on the discussion below.  ASK: We discussed the diagnosis of obesity with [name] today and [name] agreed to give [place] permission to discuss obesity behavioral modification therapy today.  ASSESS: [name] has the diagnosis of obesity and her BMI  today is 43.1. [name] is in the action stage of change.   ADVISE: [name] was educated on the multiple health risks of obesity as well as the benefit of weight loss to improve her health. She was advised of the need for long term treatment and the importance of lifestyle modifications to improve her current health and to decrease her risk of future health problems.  AGREE: Multiple dietary modification options and treatment options were discussed and [name] agreed to follow the recommendations documented in the above note.  ARRANGE: [name] was educated on the importance of frequent visits to treat obesity as outlined per CMS and USPSTF guidelines and agreed to schedule her next follow up appointment today.  Attestation Statements:   Reviewed by clinician on day of visit: allergies, medications, problem list, medical history, surgical history, family history, social history, and previous encounter notes.  I,  Water quality scientist, CMA, am acting as transcriptionist for Briscoe Deutscher, DO  I have reviewed the above documentation for accuracy and completeness, and I agree with the above. Briscoe Deutscher, DO

## 2021-01-24 MED FILL — AMLODIPINE BESYLATE 10 MG T: 10 | 90 days supply | Qty: 90 | Fill #0

## 2021-01-30 DIAGNOSIS — L668 Other cicatricial alopecia: Secondary | ICD-10-CM | POA: Diagnosis not present

## 2021-02-03 MED FILL — METOPROLOL SUCCINATE ER 100: 100 | 90 days supply | Qty: 90 | Fill #0

## 2021-02-11 DIAGNOSIS — I10 Essential (primary) hypertension: Secondary | ICD-10-CM | POA: Diagnosis not present

## 2021-02-11 DIAGNOSIS — J329 Chronic sinusitis, unspecified: Secondary | ICD-10-CM | POA: Diagnosis not present

## 2021-02-11 DIAGNOSIS — H6123 Impacted cerumen, bilateral: Secondary | ICD-10-CM | POA: Diagnosis not present

## 2021-02-11 DIAGNOSIS — K219 Gastro-esophageal reflux disease without esophagitis: Secondary | ICD-10-CM | POA: Diagnosis not present

## 2021-02-11 DIAGNOSIS — E118 Type 2 diabetes mellitus with unspecified complications: Secondary | ICD-10-CM | POA: Diagnosis not present

## 2021-02-11 DIAGNOSIS — Z6841 Body Mass Index (BMI) 40.0 and over, adult: Secondary | ICD-10-CM | POA: Diagnosis not present

## 2021-02-11 DIAGNOSIS — J358 Other chronic diseases of tonsils and adenoids: Secondary | ICD-10-CM | POA: Diagnosis not present

## 2021-02-11 DIAGNOSIS — E1165 Type 2 diabetes mellitus with hyperglycemia: Secondary | ICD-10-CM | POA: Diagnosis not present

## 2021-02-12 MED FILL — LOSARTAN POTASSIUM 100 MG T: 100 | 30 days supply | Qty: 30 | Fill #1

## 2021-02-12 MED FILL — METOPROLOL SUCCINATE ER 100: 100 | 90 days supply | Qty: 90 | Fill #0

## 2021-02-12 MED FILL — AMLODIPINE BESYLATE 10 MG T: 10 | 90 days supply | Qty: 90 | Fill #0

## 2021-02-12 MED FILL — PREMARIN 0.625 MG TABLET: 0.625 | 30 days supply | Qty: 30 | Fill #2

## 2021-02-13 MED FILL — ALBUTEROL SULFATE HFA 108 (: 108 (90 BAS | 17 days supply | Qty: 18 | Fill #2

## 2021-02-14 DIAGNOSIS — H353131 Nonexudative age-related macular degeneration, bilateral, early dry stage: Secondary | ICD-10-CM | POA: Diagnosis not present

## 2021-02-24 ENCOUNTER — Ambulatory Visit (INDEPENDENT_AMBULATORY_CARE_PROVIDER_SITE_OTHER): Payer: 59 | Admitting: Family Medicine

## 2021-02-28 ENCOUNTER — Other Ambulatory Visit: Payer: Self-pay

## 2021-02-28 DIAGNOSIS — E0590 Thyrotoxicosis, unspecified without thyrotoxic crisis or storm: Secondary | ICD-10-CM

## 2021-02-28 DIAGNOSIS — E559 Vitamin D deficiency, unspecified: Secondary | ICD-10-CM

## 2021-03-04 ENCOUNTER — Other Ambulatory Visit (HOSPITAL_COMMUNITY): Payer: Self-pay | Admitting: Orthopedic Surgery

## 2021-03-04 DIAGNOSIS — M25572 Pain in left ankle and joints of left foot: Secondary | ICD-10-CM | POA: Diagnosis not present

## 2021-03-04 DIAGNOSIS — E0590 Thyrotoxicosis, unspecified without thyrotoxic crisis or storm: Secondary | ICD-10-CM | POA: Diagnosis not present

## 2021-03-04 MED FILL — predniSONE 10 MG TABS: 10 | 12 days supply | Qty: 48 | Fill #0

## 2021-03-04 MED FILL — traMADol HCL 50 MG TABS: 50 | 5 days supply | Qty: 30 | Fill #0

## 2021-03-05 LAB — THYROID PEROXIDASE ANTIBODY: Thyroperoxidase Ab SerPl-aCnc: 22 IU/mL (ref 0–34)

## 2021-03-05 LAB — T4, FREE: Free T4: 1.35 ng/dL (ref 0.82–1.77)

## 2021-03-05 LAB — TSH: TSH: 0.603 u[IU]/mL (ref 0.450–4.500)

## 2021-03-05 LAB — T3, FREE: T3, Free: 2.6 pg/mL (ref 2.0–4.4)

## 2021-03-05 LAB — THYROGLOBULIN ANTIBODY: Thyroglobulin Antibody: <1 IU/mL (ref 0.0–0.9)

## 2021-03-06 ENCOUNTER — Ambulatory Visit: Payer: 59 | Admitting: "Endocrinology

## 2021-03-07 ENCOUNTER — Ambulatory Visit (HOSPITAL_COMMUNITY): Payer: 59

## 2021-03-10 DIAGNOSIS — E559 Vitamin D deficiency, unspecified: Secondary | ICD-10-CM | POA: Diagnosis not present

## 2021-03-10 DIAGNOSIS — R946 Abnormal results of thyroid function studies: Secondary | ICD-10-CM | POA: Diagnosis not present

## 2021-03-10 DIAGNOSIS — E782 Mixed hyperlipidemia: Secondary | ICD-10-CM | POA: Diagnosis not present

## 2021-03-10 DIAGNOSIS — Z6841 Body Mass Index (BMI) 40.0 and over, adult: Secondary | ICD-10-CM | POA: Diagnosis not present

## 2021-03-10 DIAGNOSIS — E1169 Type 2 diabetes mellitus with other specified complication: Secondary | ICD-10-CM | POA: Diagnosis not present

## 2021-03-10 MED FILL — PANTOPRAZOLE SOD DR 40 MG T: 40 | 90 days supply | Qty: 90 | Fill #2

## 2021-03-10 MED FILL — JANUVIA 100 MG TABLET: 100 | 30 days supply | Qty: 30 | Fill #2

## 2021-03-12 DIAGNOSIS — Z23 Encounter for immunization: Secondary | ICD-10-CM | POA: Diagnosis not present

## 2021-03-14 DIAGNOSIS — L668 Other cicatricial alopecia: Secondary | ICD-10-CM | POA: Diagnosis not present

## 2021-03-17 ENCOUNTER — Other Ambulatory Visit (HOSPITAL_COMMUNITY): Payer: Self-pay

## 2021-03-17 MED FILL — Estrogens, Conjugated Tab 0.625 MG: ORAL | 30 days supply | Qty: 30 | Fill #0 | Status: AC

## 2021-03-17 MED FILL — Losartan Potassium Tab 50 MG: ORAL | 30 days supply | Qty: 30 | Fill #0 | Status: AC

## 2021-03-20 ENCOUNTER — Other Ambulatory Visit (HOSPITAL_COMMUNITY): Payer: Self-pay

## 2021-03-20 MED FILL — Losartan Potassium Tab 100 MG: ORAL | 30 days supply | Qty: 30 | Fill #0 | Status: AC

## 2021-03-26 ENCOUNTER — Other Ambulatory Visit (HOSPITAL_COMMUNITY): Payer: Self-pay

## 2021-03-28 ENCOUNTER — Ambulatory Visit (HOSPITAL_COMMUNITY)
Admission: RE | Admit: 2021-03-28 | Discharge: 2021-03-28 | Disposition: A | Discharge status: Home / Self Care | Payer: 59 | Source: Ambulatory Visit | Attending: "Endocrinology | Admitting: "Endocrinology

## 2021-03-28 DIAGNOSIS — E0590 Thyrotoxicosis, unspecified without thyrotoxic crisis or storm: Secondary | ICD-10-CM | POA: Insufficient documentation

## 2021-03-28 DIAGNOSIS — E049 Nontoxic goiter, unspecified: Secondary | ICD-10-CM | POA: Diagnosis not present

## 2021-04-02 DIAGNOSIS — E1169 Type 2 diabetes mellitus with other specified complication: Secondary | ICD-10-CM | POA: Diagnosis not present

## 2021-04-02 DIAGNOSIS — E559 Vitamin D deficiency, unspecified: Secondary | ICD-10-CM | POA: Diagnosis not present

## 2021-04-02 DIAGNOSIS — R946 Abnormal results of thyroid function studies: Secondary | ICD-10-CM | POA: Diagnosis not present

## 2021-04-02 DIAGNOSIS — Z6841 Body Mass Index (BMI) 40.0 and over, adult: Secondary | ICD-10-CM | POA: Diagnosis not present

## 2021-04-02 DIAGNOSIS — E782 Mixed hyperlipidemia: Secondary | ICD-10-CM | POA: Diagnosis not present

## 2021-04-07 ENCOUNTER — Other Ambulatory Visit (HOSPITAL_COMMUNITY): Payer: Self-pay

## 2021-04-07 MED FILL — Sitagliptin Phosphate Tab 100 MG (Base Equiv): ORAL | 30 days supply | Qty: 30 | Fill #0 | Status: AC

## 2021-04-08 ENCOUNTER — Other Ambulatory Visit (HOSPITAL_COMMUNITY): Payer: Self-pay

## 2021-04-11 ENCOUNTER — Other Ambulatory Visit (HOSPITAL_COMMUNITY): Payer: Self-pay

## 2021-04-11 MED ORDER — GLIPIZIDE 5 MG PO TABS
5.0000 mg | ORAL_TABLET | Freq: Every day | ORAL | 1 refills | Status: DC
  Filled 2021-04-11: qty 90, 90d supply, fill #0
  Filled 2021-07-09: qty 90, 90d supply, fill #1

## 2021-04-11 MED FILL — Fluticasone Propionate Nasal Susp 50 MCG/ACT: NASAL | 30 days supply | Qty: 16 | Fill #0 | Status: AC

## 2021-04-14 ENCOUNTER — Other Ambulatory Visit (HOSPITAL_COMMUNITY): Payer: Self-pay

## 2021-04-14 MED FILL — Estrogens, Conjugated Tab 0.625 MG: ORAL | 30 days supply | Qty: 30 | Fill #1 | Status: AC

## 2021-04-14 MED FILL — Metformin HCl Tab ER 24HR 500 MG: ORAL | 90 days supply | Qty: 180 | Fill #0 | Status: AC

## 2021-04-15 ENCOUNTER — Other Ambulatory Visit (HOSPITAL_COMMUNITY): Payer: Self-pay

## 2021-04-16 ENCOUNTER — Other Ambulatory Visit (HOSPITAL_COMMUNITY): Payer: Self-pay

## 2021-04-16 MED ORDER — LOSARTAN POTASSIUM 100 MG PO TABS
100.0000 mg | ORAL_TABLET | Freq: Every day | ORAL | 1 refills | Status: DC
  Filled 2021-04-16: qty 90, 90d supply, fill #0
  Filled 2021-07-09: qty 90, 90d supply, fill #1

## 2021-04-17 ENCOUNTER — Other Ambulatory Visit (HOSPITAL_COMMUNITY): Payer: Self-pay

## 2021-04-21 ENCOUNTER — Other Ambulatory Visit (HOSPITAL_COMMUNITY): Payer: Self-pay

## 2021-04-22 ENCOUNTER — Other Ambulatory Visit (HOSPITAL_COMMUNITY): Payer: Self-pay

## 2021-04-23 ENCOUNTER — Other Ambulatory Visit (HOSPITAL_COMMUNITY): Payer: Self-pay

## 2021-04-25 ENCOUNTER — Other Ambulatory Visit (HOSPITAL_COMMUNITY): Payer: Self-pay

## 2021-04-25 DIAGNOSIS — L668 Other cicatricial alopecia: Secondary | ICD-10-CM | POA: Diagnosis not present

## 2021-04-25 DIAGNOSIS — L700 Acne vulgaris: Secondary | ICD-10-CM | POA: Diagnosis not present

## 2021-04-25 MED ORDER — ALPRAZOLAM 0.5 MG PO TABS
0.5000 mg | ORAL_TABLET | Freq: Two times a day (BID) | ORAL | 2 refills | Status: DC
  Filled 2021-04-25: qty 60, 30d supply, fill #0

## 2021-04-30 DIAGNOSIS — E119 Type 2 diabetes mellitus without complications: Secondary | ICD-10-CM | POA: Diagnosis not present

## 2021-04-30 DIAGNOSIS — K219 Gastro-esophageal reflux disease without esophagitis: Secondary | ICD-10-CM | POA: Diagnosis not present

## 2021-04-30 DIAGNOSIS — Z6841 Body Mass Index (BMI) 40.0 and over, adult: Secondary | ICD-10-CM | POA: Diagnosis not present

## 2021-04-30 DIAGNOSIS — I700 Atherosclerosis of aorta: Secondary | ICD-10-CM | POA: Diagnosis not present

## 2021-04-30 DIAGNOSIS — J329 Chronic sinusitis, unspecified: Secondary | ICD-10-CM | POA: Diagnosis not present

## 2021-05-16 ENCOUNTER — Other Ambulatory Visit (HOSPITAL_COMMUNITY): Payer: Self-pay

## 2021-05-16 ENCOUNTER — Ambulatory Visit: Payer: 59

## 2021-05-16 MED FILL — Amlodipine Besylate Tab 10 MG (Base Equivalent): ORAL | 90 days supply | Qty: 90 | Fill #0 | Status: AC

## 2021-05-19 ENCOUNTER — Other Ambulatory Visit (HOSPITAL_COMMUNITY): Payer: Self-pay

## 2021-05-19 MED FILL — Estrogens, Conjugated Tab 0.625 MG: ORAL | 30 days supply | Qty: 30 | Fill #2 | Status: AC

## 2021-05-19 MED FILL — Metoprolol Succinate Tab ER 24HR 100 MG (Tartrate Equiv): ORAL | 90 days supply | Qty: 90 | Fill #0 | Status: AC

## 2021-05-19 MED FILL — Sitagliptin Phosphate Tab 100 MG (Base Equiv): ORAL | 30 days supply | Qty: 30 | Fill #1 | Status: AC

## 2021-05-23 ENCOUNTER — Other Ambulatory Visit: Payer: Self-pay

## 2021-05-23 ENCOUNTER — Ambulatory Visit
Admission: RE | Admit: 2021-05-23 | Discharge: 2021-05-23 | Disposition: A | Discharge status: Home / Self Care | Payer: 59 | Source: Ambulatory Visit | Attending: Internal Medicine | Admitting: Internal Medicine

## 2021-05-23 DIAGNOSIS — Z1231 Encounter for screening mammogram for malignant neoplasm of breast: Secondary | ICD-10-CM

## 2021-06-04 ENCOUNTER — Other Ambulatory Visit (HOSPITAL_COMMUNITY): Payer: Self-pay

## 2021-06-04 MED FILL — Fluticasone Propionate Nasal Susp 50 MCG/ACT: NASAL | 30 days supply | Qty: 16 | Fill #1 | Status: AC

## 2021-06-04 MED FILL — Pantoprazole Sodium EC Tab 40 MG (Base Equiv): ORAL | 90 days supply | Qty: 90 | Fill #0 | Status: AC

## 2021-06-05 ENCOUNTER — Other Ambulatory Visit (HOSPITAL_COMMUNITY): Payer: Self-pay

## 2021-06-05 MED FILL — Doxycycline Hyclate Tab 100 MG: ORAL | 30 days supply | Qty: 60 | Fill #0 | Status: AC

## 2021-06-06 ENCOUNTER — Other Ambulatory Visit (HOSPITAL_COMMUNITY): Payer: Self-pay

## 2021-06-06 DIAGNOSIS — L668 Other cicatricial alopecia: Secondary | ICD-10-CM | POA: Diagnosis not present

## 2021-06-06 DIAGNOSIS — L308 Other specified dermatitis: Secondary | ICD-10-CM | POA: Diagnosis not present

## 2021-06-06 MED ORDER — CLINDAMYCIN PHOSPHATE 1 % EX SOLN
1.0000 "application " | Freq: Every day | CUTANEOUS | 1 refills | Status: DC
  Filled 2021-06-06: qty 60, 30d supply, fill #0
  Filled 2021-10-12: qty 60, 30d supply, fill #1

## 2021-06-06 MED ORDER — FLUTICASONE PROPIONATE 0.05 % EX CREA
1.0000 "application " | TOPICAL_CREAM | Freq: Two times a day (BID) | CUTANEOUS | 2 refills | Status: DC
  Filled 2021-06-06: qty 30, 30d supply, fill #0

## 2021-06-13 DIAGNOSIS — Z01419 Encounter for gynecological examination (general) (routine) without abnormal findings: Secondary | ICD-10-CM | POA: Diagnosis not present

## 2021-06-13 DIAGNOSIS — Z6841 Body Mass Index (BMI) 40.0 and over, adult: Secondary | ICD-10-CM | POA: Diagnosis not present

## 2021-06-20 DIAGNOSIS — M546 Pain in thoracic spine: Secondary | ICD-10-CM | POA: Diagnosis not present

## 2021-06-20 DIAGNOSIS — M9907 Segmental and somatic dysfunction of upper extremity: Secondary | ICD-10-CM | POA: Diagnosis not present

## 2021-06-20 DIAGNOSIS — M542 Cervicalgia: Secondary | ICD-10-CM | POA: Diagnosis not present

## 2021-06-20 DIAGNOSIS — G5601 Carpal tunnel syndrome, right upper limb: Secondary | ICD-10-CM | POA: Diagnosis not present

## 2021-06-20 DIAGNOSIS — M9901 Segmental and somatic dysfunction of cervical region: Secondary | ICD-10-CM | POA: Diagnosis not present

## 2021-06-20 DIAGNOSIS — M9902 Segmental and somatic dysfunction of thoracic region: Secondary | ICD-10-CM | POA: Diagnosis not present

## 2021-06-23 ENCOUNTER — Other Ambulatory Visit (HOSPITAL_COMMUNITY): Payer: Self-pay

## 2021-06-25 DIAGNOSIS — M9907 Segmental and somatic dysfunction of upper extremity: Secondary | ICD-10-CM | POA: Diagnosis not present

## 2021-06-25 DIAGNOSIS — M9902 Segmental and somatic dysfunction of thoracic region: Secondary | ICD-10-CM | POA: Diagnosis not present

## 2021-06-25 DIAGNOSIS — M9901 Segmental and somatic dysfunction of cervical region: Secondary | ICD-10-CM | POA: Diagnosis not present

## 2021-06-25 DIAGNOSIS — G5601 Carpal tunnel syndrome, right upper limb: Secondary | ICD-10-CM | POA: Diagnosis not present

## 2021-06-25 DIAGNOSIS — M546 Pain in thoracic spine: Secondary | ICD-10-CM | POA: Diagnosis not present

## 2021-06-25 DIAGNOSIS — M542 Cervicalgia: Secondary | ICD-10-CM | POA: Diagnosis not present

## 2021-06-30 ENCOUNTER — Encounter: Payer: Self-pay | Admitting: Internal Medicine

## 2021-06-30 ENCOUNTER — Other Ambulatory Visit: Payer: Self-pay

## 2021-06-30 ENCOUNTER — Ambulatory Visit (INDEPENDENT_AMBULATORY_CARE_PROVIDER_SITE_OTHER): Payer: 59 | Admitting: Internal Medicine

## 2021-06-30 ENCOUNTER — Other Ambulatory Visit (HOSPITAL_COMMUNITY)
Admission: RE | Admit: 2021-06-30 | Discharge: 2021-06-30 | Disposition: A | Discharge status: Home / Self Care | Payer: 59 | Source: Ambulatory Visit | Attending: Internal Medicine | Admitting: Internal Medicine

## 2021-06-30 VITALS — BP 202/98 | HR 85 | Ht 63.0 in | Wt 246.0 lb

## 2021-06-30 DIAGNOSIS — Z789 Other specified health status: Secondary | ICD-10-CM | POA: Diagnosis not present

## 2021-06-30 DIAGNOSIS — I10 Essential (primary) hypertension: Secondary | ICD-10-CM | POA: Insufficient documentation

## 2021-06-30 DIAGNOSIS — E0590 Thyrotoxicosis, unspecified without thyrotoxic crisis or storm: Secondary | ICD-10-CM

## 2021-06-30 DIAGNOSIS — E119 Type 2 diabetes mellitus without complications: Secondary | ICD-10-CM | POA: Insufficient documentation

## 2021-06-30 LAB — CBC
HCT: 38.2 % (ref 36.0–46.0)
Hemoglobin: 12.7 g/dL (ref 12.0–15.0)
MCH: 30.4 pg (ref 26.0–34.0)
MCHC: 33.2 g/dL (ref 30.0–36.0)
MCV: 91.4 fL (ref 80.0–100.0)
Platelets: 285 10*3/uL (ref 150–400)
RBC: 4.18 MIL/uL (ref 3.87–5.11)
RDW: 13.4 % (ref 11.5–15.5)
WBC: 8.3 10*3/uL (ref 4.0–10.5)
nRBC: 0 % (ref 0.0–0.2)

## 2021-06-30 LAB — HEMOGLOBIN A1C
Hgb A1c MFr Bld: 6.9 % — ABNORMAL HIGH (ref 4.8–5.6)
Mean Plasma Glucose: 151.33 mg/dL

## 2021-06-30 LAB — BASIC METABOLIC PANEL
Anion gap: 8 (ref 5–15)
BUN: 12 mg/dL (ref 8–23)
CO2: 26 mmol/L (ref 22–32)
Calcium: 9 mg/dL (ref 8.9–10.3)
Chloride: 103 mmol/L (ref 98–111)
Creatinine, Ser: 0.65 mg/dL (ref 0.44–1.00)
GFR, Estimated: >60 mL/min (ref >60)
Glucose, Bld: 112 mg/dL — ABNORMAL HIGH (ref 70–99)
Potassium: 3.6 mmol/L (ref 3.5–5.1)
Sodium: 137 mmol/L (ref 135–145)

## 2021-06-30 LAB — LIPID PANEL
Cholesterol: 197 mg/dL (ref 0–200)
HDL: 69 mg/dL (ref >40)
LDL Cholesterol: 101 mg/dL — ABNORMAL HIGH (ref 0–99)
Total CHOL/HDL Ratio: 2.9 RATIO
Triglycerides: 133 mg/dL (ref <150)
VLDL: 27 mg/dL (ref 0–40)

## 2021-06-30 LAB — TSH: TSH: 0.54 u[IU]/mL (ref 0.350–4.500)

## 2021-06-30 LAB — VITAMIN D 25 HYDROXY (VIT D DEFICIENCY, FRACTURES): Vit D, 25-Hydroxy: 22.85 ng/mL — ABNORMAL LOW (ref 30–100)

## 2021-06-30 NOTE — Patient Instructions (Signed)
Medication Instructions:  Your physician recommends that you continue on your current medications as directed. Please refer to the Current Medication list given to you today.  *If you need a refill on your cardiac medications before your next appointment, please call your pharmacy*   Lab Work: A1C LIPIDS VITAMIN D CBC TSH bmet If you have labs (blood work) drawn today and your tests are completely normal, you will receive your results only by: [place] (if you have MyChart) OR A paper copy in the mail If you have any lab test that is abnormal or we need to change your treatment, we will call you to review the results.   Testing/Procedures: nONE   Follow-Up: At [facility], you and your health needs are our priority.  As part of our continuing mission to provide you with exceptional heart care, we have created designated Provider Care Teams.  These Care Teams include your primary Cardiologist (physician) and Advanced Practice Providers (APPs -  Physician Assistants and Nurse Practitioners) who all work together to provide you with the care you need, when you need it.  We recommend signing up for the patient portal called "MyChart".  Sign up information is provided on this After Visit Summary.  MyChart is used to connect with patients for Virtual Visits (Telemedicine).  Patients are able to view lab/test results, encounter notes, upcoming appointments, etc.  Non-urgent messages can be sent to your provider as well.   To learn more about what you can do with MyChart, go to [email or web address].    Your next appointment:   SEPTEMBER 2022.    Other Instructions

## 2021-06-30 NOTE — Progress Notes (Signed)
Cardiology Office Note   Date:  06/30/2021   ID:  Woods, Tina 1961-10-10, MRN [id number]  PCP:  Redmond School, MD  Cardiologist:   Dorris Carnes, MD   Patient presents for f/u of HTN    History of Present Illness: [name] is a 60 y.o. female with a history of HTN, DM, diverticulities, gastritis, obesity She also had mild atheroscleroitic dz of aoreta I saw her back in 2019  She was seen by B Strader in 2021   Since seen the pt denies CP   Breathing is OK   She is very active   Mows the lawn   WOrks about 6 days per week    Glu good 120s BP 158/78 a few wks ago   She did  Breakfast:  oatmeal/berries or toast with [employer]   May skip Dinner   Veggies, meet     Works night shift   930 PM        Current Meds  Medication Sig   acetaminophen (TYLENOL) 500 MG tablet Take 1,000 mg by mouth at bedtime as needed. For pain   albuterol (PROVENTIL HFA;VENTOLIN HFA) 108 (90 Base) MCG/ACT inhaler Inhale 1-2 puffs into the lungs every 6 (six) hours as needed for wheezing or shortness of breath.   ALPRAZolam (XANAX) 0.5 MG tablet Take 0.5 mg by mouth 2 (two) times daily as needed for anxiety.    amLODipine (NORVASC) 10 MG tablet Take 10 mg by mouth daily.   aspirin EC 81 MG tablet Take 81 mg by mouth daily.   azelastine (ASTELIN) 0.1 % nasal spray Place 2 sprays into both nostrils 2 (two) times daily. Use in each nostril as directed   Blood Pressure Monitoring (OMRON 3 SERIES BP MONITOR) DEVI USE AS DIRECTED   clindamycin (CLEOCIN T) 1 % external solution Apply 1 application topically to the face daily.   doxycycline (DORYX) 100 MG EC tablet Take 100 mg by mouth daily. As needed   EPINEPHrine 0.3 mg/0.3 mL IJ SOAJ injection    estrogens, conjugated, (PREMARIN) 0.625 MG tablet TAKE 1 TABLET BY MOUTH DAILY.   fexofenadine (ALLEGRA) 180 MG tablet Take by mouth.   fluticasone (FLONASE) 50 MCG/ACT nasal spray USE 2 SPRAYS INTO BOTH NOSTRILS 2 TIMES DAILY  AS NEEDED FOR ALLERGIES OR RHINITIS.   glipiZIDE (GLUCOTROL) 5 MG tablet TAKE 1 TABLET BY MOUTH ONCE DAILY   losartan (COZAAR) 100 MG tablet Take 100 mg by mouth daily.   metFORMIN (GLUCOPHAGE) 500 MG tablet Take 500 mg by mouth 2 (two) times daily.   metoprolol succinate (TOPROL-XL) 100 MG 24 hr tablet TAKE 1 TABLET BY MOUTH DAILY   ondansetron (ZOFRAN ODT) 4 MG disintegrating tablet Take 1 tablet (4 mg total) by mouth every 8 (eight) hours as needed for nausea or vomiting.   pantoprazole (PROTONIX) 40 MG tablet TAKE 1 TABLET BY MOUTH DAILY   Semaglutide 3 MG TABS TAKE 1 TABLET BY MOUTH ONCE A DAY   sitaGLIPtin (JANUVIA) 100 MG tablet TAKE 1 TABLET BY MOUTH ONCE DAILY.   traMADol (ULTRAM) 50 MG tablet TAKE 1 TABLET BY MOUTH EVERY FOUR TO SIX HOURS AS NEEDED FOR PAIN     Allergies:   Barium-containing compounds, Statins, Sulfa antibiotics, and Victoza [liraglutide]   Past Medical History:  Diagnosis Date   Anxiety    Asthma    diagnosed as an adult   Back pain    Diabetes mellitus    Diverticulosis 01/13/2018  Gallbladder problem    Hypertension    Obesity     Past Surgical History:  Procedure Laterality Date   ABDOMINAL HYSTERECTOMY     CARPAL TUNNEL RELEASE     CESAREAN SECTION     CHOLECYSTECTOMY     SINUS EXPLORATION     TONSILLECTOMY     TRIGGER FINGER RELEASE     WRIST SURGERY       Social History:  The patient  reports that she quit smoking about 32 years ago. Her smoking use included cigarettes. She has never used smokeless tobacco. She reports that she does not drink alcohol and does not use drugs.   Family History:  The patient's family history includes Diabetes in her mother; Hypertension in her mother; Stroke in her mother.    ROS:  Please see the history of present illness. All other systems are reviewed and  Negative to the above problem except as noted.    PHYSICAL EXAM: VS:  BP (!) 202/98   Pulse 85   Ht 5\' 3"  (1.6 m)   Wt 246 lb (111.6 kg)   SpO2  96%   BMI 43.58 kg/m   GEN  Pt is is ,morbidlyobese pt in no acute distress  HEENT: normal  Neck: no JVD, carotid bruits  Cardiac: RRR; no murmurs,no LE edema  Respiratory:  clear to auscultation bilaterally, normal work of breathing GI: soft, nontender,distended, + BS  No RUQ tenderness MS: no deformity Moving all extremities   Skin: warm and dry, no rash Neuro:  Strength and sensation are intact Psych: euthymic mood, full affect   EKG:  EKG is not ordered    Lipid Panel    Component Value Date/Time   CHOL 221 (H) 06/20/2020 1410   TRIG 89 06/20/2020 1410   HDL 78 06/20/2020 1410   CHOLHDL 2.8 09/04/2016 0700   VLDL 41 (H) 09/04/2016 0700   LDLCALC 128 (H) 06/20/2020 1410      Wt Readings from Last 3 Encounters:  06/30/21 246 lb (111.6 kg)  01/15/21 243 lb (110.2 kg)  10/30/20 238 lb (108 kg)      ASSESSMENT AND PLAN:  1  HTN  Not optimally controlled   Was 150 to 158 on my check   Will get labs   Review     She has had low K in past   COnsider aldactone   2   HL   Scattered plaques o nCT   Did not tolearte statins   Consider Zetia  Recheck today    3   Morbid obesity  Discussed TRE   Also recomm a  ketogenic diet  Limit carbs   4  DM  Would recomm d/c carb heck A1C     Current medicines are reviewed at length with the patient today.  The patient does not have concerns regarding medicines.  Signed, Dorris Carnes, MD  06/30/2021 8:35 AM    [address], [place], [place]  [address] Phone: [phone]; Fax: [phone]

## 2021-07-01 ENCOUNTER — Telehealth: Payer: Self-pay | Admitting: *Deleted

## 2021-07-01 DIAGNOSIS — Z13220 Encounter for screening for lipoid disorders: Secondary | ICD-10-CM

## 2021-07-01 DIAGNOSIS — Z79899 Other long term (current) drug therapy: Secondary | ICD-10-CM

## 2021-07-01 DIAGNOSIS — E559 Vitamin D deficiency, unspecified: Secondary | ICD-10-CM

## 2021-07-01 MED ORDER — VITAMIN D 50 MCG (2000 UT) PO CAPS: 4000.0000 [IU] | ORAL_CAPSULE | Freq: Every day | ORAL | 11 refills | Status: DC

## 2021-07-01 MED ORDER — EZETIMIBE 10 MG PO TABS: 10.0000 mg | ORAL_TABLET | Freq: Every day | ORAL | 3 refills | Status: DC

## 2021-07-01 MED ORDER — SPIRONOLACTONE 25 MG PO TABS: 12.5000 mg | ORAL_TABLET | Freq: Every day | ORAL | 3 refills | Status: DC

## 2021-07-01 NOTE — Telephone Encounter (Signed)
-----   Message from Fay Records, MD sent at 07/01/2021 10:44 AM EDT ----- CBC is normal Thyroid function is normal Hgb A1C 6.9   Watch diet Vit D is low   22.9   Recomm 4000 units per day   F/U in 3 months Lipids   LDL 101  With DM would add Zetia   Did not tolerate statins   F/U lipid panel (lipomed panel) in 8 wks Electrolytes and kidney function are OK   K is 3.6  With hx of low K in past I would recomm addind 12.5 mg aldactone    Follow BP   Check BMET in 10 days    In for BP check in 4 wks

## 2021-07-01 NOTE — Telephone Encounter (Signed)
Pt notified and orders placed  

## 2021-07-09 ENCOUNTER — Other Ambulatory Visit (HOSPITAL_COMMUNITY): Payer: Self-pay

## 2021-07-09 DIAGNOSIS — Z0001 Encounter for general adult medical examination with abnormal findings: Secondary | ICD-10-CM | POA: Diagnosis not present

## 2021-07-09 DIAGNOSIS — Z6841 Body Mass Index (BMI) 40.0 and over, adult: Secondary | ICD-10-CM | POA: Diagnosis not present

## 2021-07-09 DIAGNOSIS — E1165 Type 2 diabetes mellitus with hyperglycemia: Secondary | ICD-10-CM | POA: Diagnosis not present

## 2021-07-09 MED ORDER — ALPRAZOLAM 0.5 MG PO TABS
0.5000 mg | ORAL_TABLET | Freq: Two times a day (BID) | ORAL | 2 refills | Status: DC
  Filled 2021-07-09: qty 60, 30d supply, fill #0
  Filled 2021-08-08: qty 60, 30d supply, fill #1
  Filled 2021-09-13: qty 60, 30d supply, fill #2

## 2021-07-09 MED FILL — Estrogens, Conjugated Tab 0.625 MG: ORAL | 30 days supply | Qty: 30 | Fill #3 | Status: AC

## 2021-07-09 MED FILL — Sitagliptin Phosphate Tab 100 MG (Base Equiv): ORAL | 30 days supply | Qty: 30 | Fill #2 | Status: AC

## 2021-07-11 ENCOUNTER — Other Ambulatory Visit (HOSPITAL_COMMUNITY)
Admission: RE | Admit: 2021-07-11 | Discharge: 2021-07-11 | Disposition: A | Discharge status: Home / Self Care | Payer: 59 | Source: Ambulatory Visit | Attending: Internal Medicine | Admitting: Internal Medicine

## 2021-07-11 ENCOUNTER — Other Ambulatory Visit (HOSPITAL_COMMUNITY): Payer: Self-pay | Admitting: Otolaryngology

## 2021-07-11 DIAGNOSIS — Z79899 Other long term (current) drug therapy: Secondary | ICD-10-CM | POA: Diagnosis not present

## 2021-07-11 DIAGNOSIS — E559 Vitamin D deficiency, unspecified: Secondary | ICD-10-CM | POA: Insufficient documentation

## 2021-07-11 LAB — BASIC METABOLIC PANEL
Anion gap: 9 (ref 5–15)
BUN: 15 mg/dL (ref 8–23)
CO2: 26 mmol/L (ref 22–32)
Calcium: 9.3 mg/dL (ref 8.9–10.3)
Chloride: 104 mmol/L (ref 98–111)
Creatinine, Ser: 0.6 mg/dL (ref 0.44–1.00)
GFR, Estimated: >60 mL/min (ref >60)
Glucose, Bld: 126 mg/dL — ABNORMAL HIGH (ref 70–99)
Potassium: 3.9 mmol/L (ref 3.5–5.1)
Sodium: 139 mmol/L (ref 135–145)

## 2021-07-14 ENCOUNTER — Other Ambulatory Visit (HOSPITAL_COMMUNITY): Payer: Self-pay

## 2021-07-15 ENCOUNTER — Other Ambulatory Visit (HOSPITAL_COMMUNITY): Payer: Self-pay

## 2021-07-15 ENCOUNTER — Other Ambulatory Visit (HOSPITAL_COMMUNITY): Payer: Self-pay | Admitting: Otolaryngology

## 2021-07-16 ENCOUNTER — Other Ambulatory Visit (HOSPITAL_COMMUNITY): Payer: Self-pay | Admitting: Otolaryngology

## 2021-07-16 ENCOUNTER — Other Ambulatory Visit (HOSPITAL_COMMUNITY): Payer: Self-pay

## 2021-07-17 ENCOUNTER — Other Ambulatory Visit (HOSPITAL_COMMUNITY): Payer: Self-pay | Admitting: Otolaryngology

## 2021-07-17 ENCOUNTER — Other Ambulatory Visit (HOSPITAL_COMMUNITY): Payer: Self-pay

## 2021-07-18 ENCOUNTER — Other Ambulatory Visit (HOSPITAL_COMMUNITY): Payer: Self-pay

## 2021-07-18 ENCOUNTER — Other Ambulatory Visit (HOSPITAL_COMMUNITY): Payer: Self-pay | Admitting: Otolaryngology

## 2021-07-19 LAB — VITAMIN D 1,25 DIHYDROXY
Vitamin D 1, 25 (OH)2 Total: 56 pg/mL
Vitamin D2 1, 25 (OH)2: 21 pg/mL
Vitamin D3 1, 25 (OH)2: 35 pg/mL

## 2021-07-23 DIAGNOSIS — J301 Allergic rhinitis due to pollen: Secondary | ICD-10-CM | POA: Diagnosis not present

## 2021-07-23 DIAGNOSIS — J019 Acute sinusitis, unspecified: Secondary | ICD-10-CM | POA: Diagnosis not present

## 2021-07-23 DIAGNOSIS — Z6841 Body Mass Index (BMI) 40.0 and over, adult: Secondary | ICD-10-CM | POA: Diagnosis not present

## 2021-08-01 ENCOUNTER — Other Ambulatory Visit: Payer: Self-pay

## 2021-08-01 ENCOUNTER — Ambulatory Visit (INDEPENDENT_AMBULATORY_CARE_PROVIDER_SITE_OTHER): Payer: 59

## 2021-08-01 VITALS — BP 172/94 | HR 81 | Ht 63.0 in | Wt 243.6 lb

## 2021-08-01 DIAGNOSIS — I10 Essential (primary) hypertension: Secondary | ICD-10-CM | POA: Diagnosis not present

## 2021-08-05 ENCOUNTER — Ambulatory Visit: Payer: 59 | Admitting: Sports Medicine

## 2021-08-08 ENCOUNTER — Other Ambulatory Visit (HOSPITAL_COMMUNITY): Payer: Self-pay

## 2021-08-08 DIAGNOSIS — L668 Other cicatricial alopecia: Secondary | ICD-10-CM | POA: Diagnosis not present

## 2021-08-08 DIAGNOSIS — L738 Other specified follicular disorders: Secondary | ICD-10-CM | POA: Diagnosis not present

## 2021-08-08 DIAGNOSIS — L218 Other seborrheic dermatitis: Secondary | ICD-10-CM | POA: Diagnosis not present

## 2021-08-08 DIAGNOSIS — L308 Other specified dermatitis: Secondary | ICD-10-CM | POA: Diagnosis not present

## 2021-08-08 DIAGNOSIS — L739 Follicular disorder, unspecified: Secondary | ICD-10-CM | POA: Diagnosis not present

## 2021-08-08 MED ORDER — CLOBETASOL PROPIONATE 0.05 % EX SOLN
CUTANEOUS | 2 refills | Status: DC
  Filled 2021-08-08: qty 50, 25d supply, fill #0
  Filled 2021-10-12: qty 50, 25d supply, fill #1
  Filled 2022-01-04: qty 50, 25d supply, fill #2

## 2021-08-08 MED ORDER — DOXYCYCLINE HYCLATE 100 MG PO TABS
100.0000 mg | ORAL_TABLET | Freq: Two times a day (BID) | ORAL | 2 refills | Status: DC
  Filled 2021-08-08: qty 60, 30d supply, fill #0
  Filled 2021-12-23: qty 60, 30d supply, fill #1

## 2021-08-08 MED ORDER — LEVOCETIRIZINE DIHYDROCHLORIDE 5 MG PO TABS
5.0000 mg | ORAL_TABLET | Freq: Every evening | ORAL | 1 refills | Status: DC
  Filled 2021-08-08: qty 30, 30d supply, fill #0

## 2021-08-08 MED FILL — Estrogens, Conjugated Tab 0.625 MG: ORAL | 30 days supply | Qty: 30 | Fill #4 | Status: AC

## 2021-08-08 MED FILL — Sitagliptin Phosphate Tab 100 MG (Base Equiv): ORAL | 30 days supply | Qty: 30 | Fill #3 | Status: AC

## 2021-08-11 ENCOUNTER — Other Ambulatory Visit (HOSPITAL_COMMUNITY): Payer: Self-pay

## 2021-08-12 ENCOUNTER — Other Ambulatory Visit (HOSPITAL_COMMUNITY): Payer: Self-pay | Admitting: Otolaryngology

## 2021-08-12 ENCOUNTER — Other Ambulatory Visit (HOSPITAL_COMMUNITY): Payer: Self-pay

## 2021-08-12 MED ORDER — AMLODIPINE BESYLATE 10 MG PO TABS
10.0000 mg | ORAL_TABLET | Freq: Every day | ORAL | 1 refills | Status: DC
  Filled 2021-08-12: qty 90, 90d supply, fill #0
  Filled 2021-11-08: qty 90, 90d supply, fill #1

## 2021-08-12 MED ORDER — METFORMIN HCL ER 500 MG PO TB24
500.0000 mg | ORAL_TABLET | Freq: Two times a day (BID) | ORAL | 1 refills | Status: DC
  Filled 2021-08-12: qty 180, 90d supply, fill #0
  Filled 2021-11-21: qty 180, 90d supply, fill #1

## 2021-08-13 ENCOUNTER — Other Ambulatory Visit (HOSPITAL_COMMUNITY): Payer: Self-pay

## 2021-08-13 ENCOUNTER — Other Ambulatory Visit (HOSPITAL_COMMUNITY): Payer: Self-pay | Admitting: Otolaryngology

## 2021-08-14 ENCOUNTER — Other Ambulatory Visit (HOSPITAL_COMMUNITY): Payer: Self-pay

## 2021-08-14 ENCOUNTER — Other Ambulatory Visit (HOSPITAL_COMMUNITY): Payer: Self-pay | Admitting: Otolaryngology

## 2021-08-14 MED ORDER — METFORMIN HCL ER 500 MG PO TB24
500.0000 mg | ORAL_TABLET | Freq: Two times a day (BID) | ORAL | 1 refills | Status: DC
  Filled 2021-08-14: qty 180, 90d supply, fill #0

## 2021-08-14 MED ORDER — AMLODIPINE BESYLATE 10 MG PO TABS
10.0000 mg | ORAL_TABLET | Freq: Every day | ORAL | 1 refills | Status: DC
  Filled 2021-08-14: qty 90, 90d supply, fill #0

## 2021-08-22 DIAGNOSIS — H5213 Myopia, bilateral: Secondary | ICD-10-CM | POA: Diagnosis not present

## 2021-08-26 ENCOUNTER — Other Ambulatory Visit (HOSPITAL_COMMUNITY): Payer: Self-pay

## 2021-08-26 MED ORDER — METOPROLOL SUCCINATE ER 100 MG PO TB24
100.0000 mg | ORAL_TABLET | Freq: Every day | ORAL | 1 refills | Status: DC
  Filled 2021-08-26: qty 90, 90d supply, fill #0
  Filled 2021-11-21 – 2021-11-28 (×2): qty 90, 90d supply, fill #1

## 2021-08-27 ENCOUNTER — Other Ambulatory Visit (HOSPITAL_COMMUNITY): Payer: Self-pay

## 2021-08-28 ENCOUNTER — Other Ambulatory Visit (HOSPITAL_COMMUNITY): Payer: Self-pay

## 2021-08-28 ENCOUNTER — Ambulatory Visit: Payer: 59 | Admitting: Sports Medicine

## 2021-08-28 ENCOUNTER — Other Ambulatory Visit: Payer: Self-pay

## 2021-08-28 VITALS — Ht 63.0 in | Wt 243.0 lb

## 2021-08-28 DIAGNOSIS — G5601 Carpal tunnel syndrome, right upper limb: Secondary | ICD-10-CM

## 2021-08-28 MED ORDER — TRAMADOL HCL 50 MG PO TABS
50.0000 mg | ORAL_TABLET | Freq: Four times a day (QID) | ORAL | 0 refills | Status: DC | PRN
  Filled 2021-08-28: qty 30, 8d supply, fill #0

## 2021-08-28 NOTE — Patient Instructions (Signed)
We are going to refer you to Dr. Grandville Silos for your right hand carpal tunnel.  [place], [place]  Appt: Thursday 09/11/21 @ 11:15 am. Please arrive at 11 am for paperwork.   If you need to change this appt please call their office.

## 2021-08-28 NOTE — Progress Notes (Signed)
   Subjective:    Patient ID: [name], female    DOB: 03/07/1961, 60 y.o.   MRN: [id number]  HPI chief complaint: Right hand pain  Pam presents today complaining of pain and numbness in the right hand.  She has been diagnosed with carpal tunnel in the past.  She last saw me in the office for this condition in August 2020.  I recommended an EMG/nerve conduction study at that time.  She then saw Dr. French Ana at [place] who also recommended an EMG/nerve conduction study.  That test was performed by Dr. Ron Agee in January 2021.  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  She elected not to undergo surgery.  She presents today with constant numbness in her thumb and forefinger.  She has also noticed worsening weakness, especially with holding objects.  She takes Tylenol which helps minimally.  Tramadol does help her sleep.  She is here today to discuss treatment options  Interim medical history reviewed Medications reviewed Allergies reviewed    Review of Systems As above    Objective:   Physical Exam  Well-developed, well-nourished.  No acute distress  Right wrist: Positive Tinel's.  Positive Phalen's.  There is decreased sensation to light touch along the median nerve distribution of the right hand.  Moderate thenar atrophy.  Good pulses.  EMG/nerve conduction study from 2021 as above      Assessment & Plan:   Chronic right wrist carpal tunnel syndrome  EMG/nerve conduction study done in January 2021 showed severe carpal tunnel syndrome at that time.  She has now progressed to having constant weakness and noticeable atrophy of her thenar eminence.  She needs to see an orthopedic hand specialist.  I will refer her to Dr. Grandville Silos at [place].  In the meantime, I will refill her tramadol since it does help her sleep at night.  Further treatment will be per the  discretion of Dr. Grandville Silos and the patient will follow up with me as needed.  This note was dictated using Dragon naturally speaking software and may contain errors in syntax, spelling, or content which have not been identified prior to signing this note.

## 2021-08-29 ENCOUNTER — Ambulatory Visit: Payer: 59 | Admitting: Internal Medicine

## 2021-09-02 DIAGNOSIS — M25572 Pain in left ankle and joints of left foot: Secondary | ICD-10-CM | POA: Diagnosis not present

## 2021-09-04 ENCOUNTER — Ambulatory Visit: Payer: Self-pay | Admitting: Physician Assistant

## 2021-09-04 ENCOUNTER — Other Ambulatory Visit (HOSPITAL_COMMUNITY): Payer: Self-pay

## 2021-09-04 DIAGNOSIS — J310 Chronic rhinitis: Secondary | ICD-10-CM | POA: Diagnosis not present

## 2021-09-04 DIAGNOSIS — J343 Hypertrophy of nasal turbinates: Secondary | ICD-10-CM | POA: Diagnosis not present

## 2021-09-04 DIAGNOSIS — H6983 Other specified disorders of Eustachian tube, bilateral: Secondary | ICD-10-CM | POA: Diagnosis not present

## 2021-09-04 MED ORDER — PREDNISONE 10 MG (21) PO TBPK
ORAL_TABLET | ORAL | 0 refills | Status: DC
  Filled 2021-09-04: qty 21, 6d supply, fill #0

## 2021-09-04 MED ORDER — FLUTICASONE PROPIONATE 50 MCG/ACT NA SUSP
2.0000 | Freq: Every day | NASAL | 11 refills | Status: DC
  Filled 2021-09-04: qty 16, 30d supply, fill #0
  Filled 2021-10-12: qty 16, 30d supply, fill #1
  Filled 2021-11-28: qty 16, 30d supply, fill #2
  Filled 2022-01-05: qty 16, 30d supply, fill #3
  Filled 2022-03-01: qty 16, 30d supply, fill #4
  Filled 2022-04-01: qty 16, 30d supply, fill #5
  Filled 2022-05-10: qty 16, 30d supply, fill #6
  Filled 2022-07-19: qty 16, 30d supply, fill #7
  Filled 2022-08-18: qty 16, 30d supply, fill #8

## 2021-09-04 NOTE — H&P (View-Only) (Signed)
[name] is an 60 y.o. female.   Chief Complaint: R wrist/hand pain HPI: Severe right carpal tunnel confirmed on nerve conduction.  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    Past Medical History:  Diagnosis Date   Anxiety    Asthma    diagnosed as an adult   Back pain    Diabetes mellitus    Diverticulosis 01/13/2018   Gallbladder problem    Hypertension    Obesity     Past Surgical History:  Procedure Laterality Date   ABDOMINAL HYSTERECTOMY     CARPAL TUNNEL RELEASE     CESAREAN SECTION     CHOLECYSTECTOMY     SINUS EXPLORATION     TONSILLECTOMY     TRIGGER FINGER RELEASE     WRIST SURGERY      Family History  Problem Relation Age of Onset   Hypertension Mother    Diabetes Mother    Stroke Mother    Social History:  reports that she quit smoking about 32 years ago. Her smoking use included cigarettes. She has never used smokeless tobacco. She reports that she does not drink alcohol and does not use drugs.  Allergies:  Allergies  Allergen Reactions   Barium-Containing Compounds Itching and Other (See Comments)    Weakness, loss of voice   Cimetidine    Statins Other (See Comments)    Myalgia, muscle weakness   Sulfa Antibiotics Itching and Other (See Comments)    Weakness, loss of voice   Victoza [Liraglutide] Diarrhea and Nausea Only    (Not in a hospital admission)   No results found for this or any previous visit (from the past 48 hour(s)). No results found.  Review of Systems  Neurological:  Positive for numbness.  All other systems reviewed and are negative.  There were no vitals taken for this visit. Physical Exam Constitutional:      Appearance: Normal appearance.  HENT:     Head: Normocephalic and atraumatic.  Eyes:     Extraocular Movements: Extraocular movements intact.     Pupils: Pupils are equal, round, and reactive to light.  Cardiovascular:     Rate and Rhythm: Normal rate.     Pulses:  Normal pulses.  Pulmonary:     Effort: Pulmonary effort is normal. No respiratory distress.  Abdominal:     General: Abdomen is flat. There is no distension.  Musculoskeletal:     Cervical back: Normal range of motion and neck supple.     Comments: Right wrist .  Positive Tinel's.  Positive Phalen's.  Severe symptoms.    Skin:    General: Skin is warm and dry.     Findings: No erythema or rash.  Neurological:     General: No focal deficit present.     Mental Status: She is alert and oriented to person, place, and time.  Psychiatric:        Mood and Affect: Mood normal.        Behavior: Behavior normal.     Assessment/Plan Risks and benefits of right carpal tunnel release discussed with the patient.  Anticipate 3-6 weeks out of work after the surgery.    Chriss Czar, PA-C 09/04/2021, 5:23 PM

## 2021-09-04 NOTE — H&P (Signed)
[name] is an 60 y.o. female.   Chief Complaint: R wrist/hand pain HPI: Severe right carpal tunnel confirmed on nerve conduction.  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    Past Medical History:  Diagnosis Date   Anxiety    Asthma    diagnosed as an adult   Back pain    Diabetes mellitus    Diverticulosis 01/13/2018   Gallbladder problem    Hypertension    Obesity     Past Surgical History:  Procedure Laterality Date   ABDOMINAL HYSTERECTOMY     CARPAL TUNNEL RELEASE     CESAREAN SECTION     CHOLECYSTECTOMY     SINUS EXPLORATION     TONSILLECTOMY     TRIGGER FINGER RELEASE     WRIST SURGERY      Family History  Problem Relation Age of Onset   Hypertension Mother    Diabetes Mother    Stroke Mother    Social History:  reports that she quit smoking about 32 years ago. Her smoking use included cigarettes. She has never used smokeless tobacco. She reports that she does not drink alcohol and does not use drugs.  Allergies:  Allergies  Allergen Reactions   Barium-Containing Compounds Itching and Other (See Comments)    Weakness, loss of voice   Cimetidine    Statins Other (See Comments)    Myalgia, muscle weakness   Sulfa Antibiotics Itching and Other (See Comments)    Weakness, loss of voice   Victoza [Liraglutide] Diarrhea and Nausea Only    (Not in a hospital admission)   No results found for this or any previous visit (from the past 48 hour(s)). No results found.  Review of Systems  Neurological:  Positive for numbness.  All other systems reviewed and are negative.  There were no vitals taken for this visit. Physical Exam Constitutional:      Appearance: Normal appearance.  HENT:     Head: Normocephalic and atraumatic.  Eyes:     Extraocular Movements: Extraocular movements intact.     Pupils: Pupils are equal, round, and reactive to light.  Cardiovascular:     Rate and Rhythm: Normal rate.     Pulses:  Normal pulses.  Pulmonary:     Effort: Pulmonary effort is normal. No respiratory distress.  Abdominal:     General: Abdomen is flat. There is no distension.  Musculoskeletal:     Cervical back: Normal range of motion and neck supple.     Comments: Right wrist .  Positive Tinel's.  Positive Phalen's.  Severe symptoms.    Skin:    General: Skin is warm and dry.     Findings: No erythema or rash.  Neurological:     General: No focal deficit present.     Mental Status: She is alert and oriented to person, place, and time.  Psychiatric:        Mood and Affect: Mood normal.        Behavior: Behavior normal.     Assessment/Plan Risks and benefits of right carpal tunnel release discussed with the patient.  Anticipate 3-6 weeks out of work after the surgery.    Chriss Czar, PA-C 09/04/2021, 5:23 PM

## 2021-09-05 ENCOUNTER — Other Ambulatory Visit (HOSPITAL_COMMUNITY): Payer: Self-pay

## 2021-09-05 DIAGNOSIS — M1711 Unilateral primary osteoarthritis, right knee: Secondary | ICD-10-CM | POA: Diagnosis not present

## 2021-09-08 ENCOUNTER — Other Ambulatory Visit (HOSPITAL_COMMUNITY): Payer: Self-pay

## 2021-09-09 ENCOUNTER — Other Ambulatory Visit (HOSPITAL_COMMUNITY): Payer: Self-pay

## 2021-09-10 ENCOUNTER — Other Ambulatory Visit (HOSPITAL_COMMUNITY): Payer: Self-pay

## 2021-09-10 MED ORDER — PANTOPRAZOLE SODIUM 40 MG PO TBEC
40.0000 mg | DELAYED_RELEASE_TABLET | Freq: Every day | ORAL | 3 refills | Status: DC
  Filled 2021-09-10: qty 90, 90d supply, fill #0
  Filled 2021-11-28 – 2021-12-12 (×2): qty 90, 90d supply, fill #1
  Filled 2022-03-01: qty 90, 90d supply, fill #2
  Filled 2022-06-01: qty 90, 90d supply, fill #3

## 2021-09-12 ENCOUNTER — Other Ambulatory Visit (HOSPITAL_COMMUNITY): Payer: Self-pay

## 2021-09-12 DIAGNOSIS — M170 Bilateral primary osteoarthritis of knee: Secondary | ICD-10-CM | POA: Diagnosis not present

## 2021-09-13 MED FILL — Estrogens, Conjugated Tab 0.625 MG: ORAL | 30 days supply | Qty: 30 | Fill #5 | Status: AC

## 2021-09-13 MED FILL — Sitagliptin Phosphate Tab 100 MG (Base Equiv): ORAL | 30 days supply | Qty: 30 | Fill #4 | Status: AC

## 2021-09-15 ENCOUNTER — Other Ambulatory Visit (HOSPITAL_COMMUNITY): Payer: Self-pay

## 2021-09-16 ENCOUNTER — Other Ambulatory Visit (HOSPITAL_COMMUNITY): Payer: Self-pay

## 2021-09-23 ENCOUNTER — Encounter (HOSPITAL_BASED_OUTPATIENT_CLINIC_OR_DEPARTMENT_OTHER): Payer: Self-pay | Admitting: Orthopedic Surgery

## 2021-09-23 ENCOUNTER — Other Ambulatory Visit: Payer: Self-pay

## 2021-09-23 ENCOUNTER — Ambulatory Visit: Payer: Self-pay | Admitting: Physician Assistant

## 2021-09-26 ENCOUNTER — Encounter (HOSPITAL_BASED_OUTPATIENT_CLINIC_OR_DEPARTMENT_OTHER)
Admission: RE | Admit: 2021-09-26 | Discharge: 2021-09-26 | Disposition: A | Discharge status: Home / Self Care | Payer: 59 | Source: Ambulatory Visit | Attending: Orthopedic Surgery | Admitting: Orthopedic Surgery

## 2021-09-26 DIAGNOSIS — Z01818 Encounter for other preprocedural examination: Secondary | ICD-10-CM | POA: Insufficient documentation

## 2021-09-26 LAB — BASIC METABOLIC PANEL
Anion gap: 10 (ref 5–15)
BUN: 10 mg/dL (ref 8–23)
CO2: 27 mmol/L (ref 22–32)
Calcium: 9.1 mg/dL (ref 8.9–10.3)
Chloride: 103 mmol/L (ref 98–111)
Creatinine, Ser: 0.54 mg/dL (ref 0.44–1.00)
GFR, Estimated: >60 mL/min (ref >60)
Glucose, Bld: 100 mg/dL — ABNORMAL HIGH (ref 70–99)
Potassium: 5.1 mmol/L (ref 3.5–5.1)
Sodium: 140 mmol/L (ref 135–145)

## 2021-09-26 NOTE — Progress Notes (Signed)

## 2021-09-30 ENCOUNTER — Other Ambulatory Visit (HOSPITAL_COMMUNITY): Payer: Self-pay

## 2021-09-30 MED ORDER — GLIPIZIDE 5 MG PO TABS
5.0000 mg | ORAL_TABLET | Freq: Every day | ORAL | 0 refills | Status: DC
  Filled 2021-09-30: qty 90, 90d supply, fill #0

## 2021-09-30 MED ORDER — LOSARTAN POTASSIUM 100 MG PO TABS
100.0000 mg | ORAL_TABLET | Freq: Every day | ORAL | 0 refills | Status: DC
  Filled 2021-09-30: qty 90, 90d supply, fill #0

## 2021-10-01 ENCOUNTER — Other Ambulatory Visit (HOSPITAL_COMMUNITY): Payer: Self-pay

## 2021-10-01 ENCOUNTER — Encounter (HOSPITAL_BASED_OUTPATIENT_CLINIC_OR_DEPARTMENT_OTHER): Payer: Self-pay | Admitting: Orthopedic Surgery

## 2021-10-01 ENCOUNTER — Encounter (HOSPITAL_BASED_OUTPATIENT_CLINIC_OR_DEPARTMENT_OTHER)
Admission: RE | Disposition: A | Discharge status: Home / Self Care | Payer: Self-pay | Source: Home / Self Care | Attending: Orthopedic Surgery

## 2021-10-01 ENCOUNTER — Other Ambulatory Visit: Payer: Self-pay

## 2021-10-01 ENCOUNTER — Ambulatory Visit (HOSPITAL_BASED_OUTPATIENT_CLINIC_OR_DEPARTMENT_OTHER): Payer: 59 | Admitting: Anesthesiology

## 2021-10-01 ENCOUNTER — Ambulatory Visit (HOSPITAL_BASED_OUTPATIENT_CLINIC_OR_DEPARTMENT_OTHER)
Admission: RE | Admit: 2021-10-01 | Discharge: 2021-10-01 | Disposition: A | Discharge status: Home / Self Care | Payer: 59 | Attending: Orthopedic Surgery | Admitting: Orthopedic Surgery

## 2021-10-01 DIAGNOSIS — K219 Gastro-esophageal reflux disease without esophagitis: Secondary | ICD-10-CM | POA: Diagnosis not present

## 2021-10-01 DIAGNOSIS — E669 Obesity, unspecified: Secondary | ICD-10-CM | POA: Insufficient documentation

## 2021-10-01 DIAGNOSIS — J45909 Unspecified asthma, uncomplicated: Secondary | ICD-10-CM | POA: Diagnosis not present

## 2021-10-01 DIAGNOSIS — Z888 Allergy status to other drugs, medicaments and biological substances status: Secondary | ICD-10-CM | POA: Diagnosis not present

## 2021-10-01 DIAGNOSIS — G5601 Carpal tunnel syndrome, right upper limb: Secondary | ICD-10-CM | POA: Insufficient documentation

## 2021-10-01 DIAGNOSIS — Z882 Allergy status to sulfonamides status: Secondary | ICD-10-CM | POA: Insufficient documentation

## 2021-10-01 DIAGNOSIS — Z87891 Personal history of nicotine dependence: Secondary | ICD-10-CM | POA: Insufficient documentation

## 2021-10-01 DIAGNOSIS — I10 Essential (primary) hypertension: Secondary | ICD-10-CM | POA: Insufficient documentation

## 2021-10-01 DIAGNOSIS — E119 Type 2 diabetes mellitus without complications: Secondary | ICD-10-CM | POA: Diagnosis not present

## 2021-10-01 DIAGNOSIS — Z6841 Body Mass Index (BMI) 40.0 and over, adult: Secondary | ICD-10-CM | POA: Insufficient documentation

## 2021-10-01 DIAGNOSIS — E559 Vitamin D deficiency, unspecified: Secondary | ICD-10-CM | POA: Diagnosis not present

## 2021-10-01 DIAGNOSIS — E1165 Type 2 diabetes mellitus with hyperglycemia: Secondary | ICD-10-CM | POA: Diagnosis not present

## 2021-10-01 HISTORY — DX: Gastro-esophageal reflux disease without esophagitis: K21.9

## 2021-10-01 HISTORY — PX: CARPAL TUNNEL RELEASE: SHX101

## 2021-10-01 LAB — GLUCOSE, CAPILLARY
Glucose-Capillary: 119 mg/dL — ABNORMAL HIGH (ref 70–99)
Glucose-Capillary: 139 mg/dL — ABNORMAL HIGH (ref 70–99)

## 2021-10-01 SURGERY — CARPAL TUNNEL RELEASE
Anesthesia: Monitor Anesthesia Care | Site: Hand | Laterality: Right

## 2021-10-01 MED ORDER — LIDOCAINE HCL (PF) 1 % IJ SOLN
INTRAMUSCULAR | Status: AC
  Filled 2021-10-01: qty 30

## 2021-10-01 MED ORDER — SILVER NITRATE-POT NITRATE 75-25 % EX MISC
CUTANEOUS | Status: AC
  Filled 2021-10-01: qty 10

## 2021-10-01 MED ORDER — OXYCODONE HCL 5 MG PO TABS: 5.0000 mg | ORAL_TABLET | Freq: Once | ORAL | Status: DC | PRN

## 2021-10-01 MED ORDER — METHYLPREDNISOLONE ACETATE 40 MG/ML IJ SUSP
INTRAMUSCULAR | Status: DC | PRN
Start: 2021-10-01 — End: 2021-10-01
  Administered 2021-10-01: 40 mg

## 2021-10-01 MED ORDER — OXYCODONE HCL 5 MG/5ML PO SOLN: 5.0000 mg | Freq: Once | ORAL | Status: DC | PRN

## 2021-10-01 MED ORDER — HYDROMORPHONE HCL 1 MG/ML IJ SOLN
INTRAMUSCULAR | Status: AC
  Filled 2021-10-01: qty 0.5

## 2021-10-01 MED ORDER — DEXMEDETOMIDINE (PRECEDEX) IN NS 20 MCG/5ML (4 MCG/ML) IV SYRINGE
PREFILLED_SYRINGE | INTRAVENOUS | Status: DC | PRN
  Administered 2021-10-01 (×2): 8 ug via INTRAVENOUS

## 2021-10-01 MED ORDER — DEXMEDETOMIDINE HCL IN NACL 200 MCG/50ML IV SOLN
INTRAVENOUS | Status: AC
  Filled 2021-10-01: qty 50

## 2021-10-01 MED ORDER — PROMETHAZINE HCL 25 MG/ML IJ SOLN: 6.2500 mg | INTRAMUSCULAR | Status: DC | PRN

## 2021-10-01 MED ORDER — PROPOFOL 500 MG/50ML IV EMUL
INTRAVENOUS | Status: DC | PRN
  Administered 2021-10-01: 50 ug/kg/min via INTRAVENOUS

## 2021-10-01 MED ORDER — LIDOCAINE-EPINEPHRINE (PF) 1 %-1:200000 IJ SOLN
INTRAMUSCULAR | Status: AC
  Filled 2021-10-01: qty 30

## 2021-10-01 MED ORDER — BUPIVACAINE HCL (PF) 0.25 % IJ SOLN
INTRAMUSCULAR | Status: AC
  Filled 2021-10-01: qty 30

## 2021-10-01 MED ORDER — ONDANSETRON HCL 4 MG/2ML IJ SOLN
INTRAMUSCULAR | Status: DC | PRN
  Administered 2021-10-01: 4 mg via INTRAVENOUS

## 2021-10-01 MED ORDER — BUPIVACAINE HCL (PF) 0.5 % IJ SOLN
INTRAMUSCULAR | Status: DC | PRN
  Administered 2021-10-01: 10 mL

## 2021-10-01 MED ORDER — CEFAZOLIN SODIUM-DEXTROSE 2-4 GM/100ML-% IV SOLN
INTRAVENOUS | Status: AC
  Filled 2021-10-01: qty 100

## 2021-10-01 MED ORDER — FENTANYL CITRATE (PF) 100 MCG/2ML IJ SOLN
INTRAMUSCULAR | Status: DC | PRN
  Administered 2021-10-01: 50 ug via INTRAVENOUS

## 2021-10-01 MED ORDER — HYDROCODONE-ACETAMINOPHEN 5-325 MG PO TABS
1.0000 | ORAL_TABLET | ORAL | 0 refills | Status: DC | PRN
  Filled 2021-10-01: qty 40, 7d supply, fill #0

## 2021-10-01 MED ORDER — FENTANYL CITRATE (PF) 100 MCG/2ML IJ SOLN
INTRAMUSCULAR | Status: AC
  Filled 2021-10-01: qty 2

## 2021-10-01 MED ORDER — HYDROMORPHONE HCL 1 MG/ML IJ SOLN
0.2500 mg | INTRAMUSCULAR | Status: DC | PRN
  Administered 2021-10-01 (×2): 0.25 mg via INTRAVENOUS

## 2021-10-01 MED ORDER — METHYLPREDNISOLONE ACETATE 40 MG/ML IJ SUSP
INTRAMUSCULAR | Status: AC
  Filled 2021-10-01: qty 1

## 2021-10-01 MED ORDER — BUPIVACAINE HCL (PF) 0.5 % IJ SOLN
INTRAMUSCULAR | Status: AC
  Filled 2021-10-01: qty 30

## 2021-10-01 MED ORDER — CEFAZOLIN SODIUM-DEXTROSE 2-4 GM/100ML-% IV SOLN
2.0000 g | INTRAVENOUS | Status: AC
  Administered 2021-10-01: 2 g via INTRAVENOUS

## 2021-10-01 MED ORDER — MIDAZOLAM HCL 5 MG/5ML IJ SOLN
INTRAMUSCULAR | Status: DC | PRN
  Administered 2021-10-01: 2 mg via INTRAVENOUS

## 2021-10-01 MED ORDER — PROPOFOL 10 MG/ML IV BOLUS
INTRAVENOUS | Status: DC | PRN
  Administered 2021-10-01: 30 mg via INTRAVENOUS
  Administered 2021-10-01: 20 mg via INTRAVENOUS
  Administered 2021-10-01: 10 mg via INTRAVENOUS
  Administered 2021-10-01: 20 mg via INTRAVENOUS

## 2021-10-01 MED ORDER — HYDROCODONE-ACETAMINOPHEN 5-325 MG PO TABS
1.0000 | ORAL_TABLET | ORAL | 0 refills | Status: DC | PRN
  Filled 2021-10-01: qty 20, 4d supply, fill #0

## 2021-10-01 MED ORDER — MIDAZOLAM HCL 2 MG/2ML IJ SOLN
INTRAMUSCULAR | Status: AC
  Filled 2021-10-01: qty 2

## 2021-10-01 MED ORDER — LACTATED RINGERS IV SOLN: INTRAVENOUS | Status: DC

## 2021-10-01 SURGICAL SUPPLY — 38 items
BLADE MINI RND TIP GREEN BEAV (BLADE) IMPLANT
BLADE SURG 15 STRL LF DISP TIS (BLADE) ×1 IMPLANT
BLADE SURG 15 STRL SS (BLADE) ×2
BNDG CMPR 9X4 STRL LF SNTH (GAUZE/BANDAGES/DRESSINGS) ×1
BNDG CONFORM 3 STRL LF (GAUZE/BANDAGES/DRESSINGS) ×2 IMPLANT
BNDG ELASTIC 3X5.8 VLCR STR LF (GAUZE/BANDAGES/DRESSINGS) ×2 IMPLANT
BNDG ESMARK 4X9 LF (GAUZE/BANDAGES/DRESSINGS) ×2 IMPLANT
CORD BIPOLAR FORCEPS 12FT (ELECTRODE) ×2 IMPLANT
COVER BACK TABLE 60X90IN (DRAPES) ×2 IMPLANT
COVER MAYO STAND STRL (DRAPES) ×2 IMPLANT
DRAPE EXTREMITY T 121X128X90 (DISPOSABLE) ×2 IMPLANT
DRSG EMULSION OIL 3X3 NADH (GAUZE/BANDAGES/DRESSINGS) ×2 IMPLANT
DRSG PAD ABDOMINAL 8X10 ST (GAUZE/BANDAGES/DRESSINGS) IMPLANT
DURAPREP 26ML APPLICATOR (WOUND CARE) ×2 IMPLANT
GAUZE SPONGE 4X4 12PLY STRL (GAUZE/BANDAGES/DRESSINGS) ×2 IMPLANT
GLOVE SRG 8 PF TXTR STRL LF DI (GLOVE) ×2 IMPLANT
GLOVE SURG ENC MOIS LTX SZ7.5 (GLOVE) ×3 IMPLANT
GLOVE SURG ORTHO LTX SZ8 (GLOVE) ×3 IMPLANT
GLOVE SURG POLYISO LF SZ6.5 (GLOVE) ×1 IMPLANT
GLOVE SURG UNDER POLY LF SZ6.5 (GLOVE) ×1 IMPLANT
GLOVE SURG UNDER POLY LF SZ7 (GLOVE) ×1 IMPLANT
GLOVE SURG UNDER POLY LF SZ8 (GLOVE) ×4
GOWN STRL REUS W/ TWL LRG LVL3 (GOWN DISPOSABLE) ×1 IMPLANT
GOWN STRL REUS W/ TWL XL LVL3 (GOWN DISPOSABLE) ×1 IMPLANT
GOWN STRL REUS W/TWL LRG LVL3 (GOWN DISPOSABLE) ×2
GOWN STRL REUS W/TWL XL LVL3 (GOWN DISPOSABLE) ×4
NDL HYPO 25X1 1.5 SAFETY (NEEDLE) ×1 IMPLANT
NEEDLE HYPO 25X1 1.5 SAFETY (NEEDLE) ×4 IMPLANT
NS IRRIG 1000ML POUR BTL (IV SOLUTION) ×2 IMPLANT
PACK BASIN DAY SURGERY FS (CUSTOM PROCEDURE TRAY) ×2 IMPLANT
STOCKINETTE 4X48 STRL (DRAPES) ×2 IMPLANT
SUT ETHILON 4 0 PS 2 18 (SUTURE) ×2 IMPLANT
SUT ETHILON 5 0 P 3 18 (SUTURE)
SUT NYLON ETHILON 5-0 P-3 1X18 (SUTURE) IMPLANT
SYR BULB EAR ULCER 3OZ GRN STR (SYRINGE) ×2 IMPLANT
SYR CONTROL 10ML LL (SYRINGE) ×2 IMPLANT
TOWEL GREEN STERILE FF (TOWEL DISPOSABLE) ×2 IMPLANT
UNDERPAD 30X36 HEAVY ABSORB (UNDERPADS AND DIAPERS) ×2 IMPLANT

## 2021-10-01 NOTE — Transfer of Care (Signed)
Immediate Anesthesia Transfer of Care Note  Patient: [name]  Procedure(s) Performed: RIGHT CARPAL TUNNEL RELEASE (Right: Hand)  Patient Location: PACU  Anesthesia Type:MAC  Level of Consciousness: drowsy, patient cooperative and responds to stimulation  Airway & Oxygen Therapy: Patient Spontanous Breathing and Patient connected to face mask oxygen  Post-op Assessment: Report given to RN and Post -op Vital signs reviewed and stable  Post vital signs: Reviewed and stable  Last Vitals:  Vitals Value Taken Time  BP 155/69 10/01/21 0945  Temp 36.6 C 10/01/21 0945  Pulse 71 10/01/21 0948  Resp 16 10/01/21 0948  SpO2 99 % 10/01/21 0948  Vitals shown include unvalidated device data.  Last Pain:  Vitals:   10/01/21 0733  TempSrc: Oral  PainSc: 0-No pain         Complications: No notable events documented.

## 2021-10-01 NOTE — Anesthesia Preprocedure Evaluation (Addendum)
Anesthesia Evaluation  Patient identified by MRN, date of birth, ID band Patient awake    Reviewed: Allergy & Precautions, NPO status , Patient's Chart, lab work & pertinent test results  Airway Mallampati: II  TM Distance: >3 FB Neck ROM: Full    Dental no notable dental hx.    Pulmonary asthma , former smoker,    Pulmonary exam normal breath sounds clear to auscultation       Cardiovascular hypertension, Pt. on medications negative cardio ROS Normal cardiovascular exam Rhythm:Regular Rate:Normal     Neuro/Psych Anxiety negative neurological ROS  negative psych ROS   GI/Hepatic Neg liver ROS, GERD  ,  Endo/Other  diabetes, Type 2Hyperthyroidism Morbid obesity  Renal/GU negative Renal ROS  negative genitourinary   Musculoskeletal negative musculoskeletal ROS (+)   Abdominal (+) + obese,   Peds negative pediatric ROS (+)  Hematology negative hematology ROS (+)   Anesthesia Other Findings   Reproductive/Obstetrics negative OB ROS                             Anesthesia Physical Anesthesia Plan  ASA: 3  Anesthesia Plan: MAC   Post-op Pain Management:    Induction: Intravenous  PONV Risk Score and Plan: 2 and Ondansetron, Midazolam and Treatment may vary due to age or medical condition  Airway Management Planned: Simple Face Mask  Additional Equipment:   Intra-op Plan:   Post-operative Plan:   Informed Consent: I have reviewed the patients History and Physical, chart, labs and discussed the procedure including the risks, benefits and alternatives for the proposed anesthesia with the patient or authorized representative who has indicated his/her understanding and acceptance.     Dental advisory given  Plan Discussed with: CRNA  Anesthesia Plan Comments:         Anesthesia Quick Evaluation

## 2021-10-01 NOTE — Interval H&P Note (Signed)
History and Physical Interval Note:  10/01/2021 8:56 AM  [name]  has presented today for surgery, with the diagnosis of RIGHT CARPAL TUNNEL SYNDROME.  The various methods of treatment have been discussed with the patient and family. After consideration of risks, benefits and other options for treatment, the patient has consented to  Procedure(s): RIGHT CARPAL TUNNEL RELEASE (Right) as a surgical intervention.  The patient's history has been reviewed, patient examined, no change in status, stable for surgery.  I have reviewed the patient's chart and labs.  Questions were answered to the patient's satisfaction.     Yvette Rack

## 2021-10-01 NOTE — Anesthesia Postprocedure Evaluation (Signed)
Anesthesia Post Note  Patient: [name]  Procedure(s) Performed: RIGHT CARPAL TUNNEL RELEASE (Right: Hand)     Patient location during evaluation: PACU Anesthesia Type: MAC Level of consciousness: awake and alert Pain management: pain level controlled Vital Signs Assessment: post-procedure vital signs reviewed and stable Respiratory status: spontaneous breathing, nonlabored ventilation and respiratory function stable Cardiovascular status: blood pressure returned to baseline and stable Postop Assessment: no apparent nausea or vomiting Anesthetic complications: no   No notable events documented.  Last Vitals:  Vitals:   10/01/21 1015 10/01/21 1055  BP: (!) 154/74 (!) 141/66  Pulse: 65 74  Resp: 10 16  Temp:  36.8 C  SpO2: 95% 94%    Last Pain:  Vitals:   10/01/21 1029  TempSrc:   PainSc: 0-No pain                 Lynda Rainwater

## 2021-10-01 NOTE — Discharge Instructions (Addendum)
Diet: As you were doing prior to hospitalization   Activity: Increase activity slowly as tolerated  No lifting or driving for 48 hrs  Shower: May shower without a dressing on post op day #3, NO SOAKING OR SUBMERGING HAND  Dressing: You may change your dressing on post op day #3.  Then change the dressing daily with sterile 4"x4"s gauze dressing  Or band aid  Weight Bearing: nonweight bearing surgical hand until incision heals  To prevent constipation: you may use a stool softener such as -  Colace ( over the counter) 100 mg by mouth twice a day  Drink plenty of fluids ( prune juice may be helpful) and high fiber foods  Miralax ( over the counter) for constipation as needed.   Precautions: If you experience chest pain or shortness of breath - call 911 immediately For transfer to the hospital emergency department!!  If you develop a fever greater that 101 F, purulent drainage from wound, increased redness or drainage from wound, or calf pain -- Call the office   Follow- Up Appointment: Please call for an appointment to be seen in 2 weeks  [place] - [phone]   Post Anesthesia Home Care Instructions  Activity: Get plenty of rest for the remainder of the day. A responsible individual must stay with you for 24 hours following the procedure.  For the next 24 hours, DO NOT: -Drive a car -Paediatric nurse -Drink alcoholic beverages -Take any medication unless instructed by your physician -Make any legal decisions or sign important papers.  Meals: Start with liquid foods such as gelatin or soup. Progress to regular foods as tolerated. Avoid greasy, spicy, heavy foods. If nausea and/or vomiting occur, drink only clear liquids until the nausea and/or vomiting subsides. Call your physician if vomiting continues.  Special Instructions/Symptoms: Your throat may feel dry or sore from the anesthesia or the breathing tube placed in your throat during surgery. If this causes discomfort,  gargle with warm salt water. The discomfort should disappear within 24 hours.  If you had a scopolamine patch placed behind your ear for the management of post- operative nausea and/or vomiting:  1. The medication in the patch is effective for 72 hours, after which it should be removed.  Wrap patch in a tissue and discard in the trash. Wash hands thoroughly with soap and water. 2. You may remove the patch earlier than 72 hours if you experience unpleasant side effects which may include dry mouth, dizziness or visual disturbances. 3. Avoid touching the patch. Wash your hands with soap and water after contact with the patch.

## 2021-10-01 NOTE — Anesthesia Procedure Notes (Signed)
Date/Time: 10/01/2021 9:04 AM Performed by: Glory Buff, CRNA Oxygen Delivery Method: Simple face mask

## 2021-10-01 NOTE — Brief Op Note (Signed)
10/01/2021  9:54 AM  PATIENT:  Stephannie Peters  60 y.o. female  PRE-OPERATIVE DIAGNOSIS:  RIGHT CARPAL TUNNEL SYNDROME  POST-OPERATIVE DIAGNOSIS:  RIGHT CARPAL TUNNEL SYNDROME  PROCEDURE:  Procedure(s): RIGHT CARPAL TUNNEL RELEASE (Right)  SURGEON:  Surgeon(s) and Role:    * Earlie Server, MD - Primary  PHYSICIAN ASSISTANT: Chriss Czar, PA-C  ASSISTANTS:    ANESTHESIA:   local and MAC  EBL:  0 mL   BLOOD ADMINISTERED:none  DRAINS: none   LOCAL MEDICATIONS USED:  MARCAINE     SPECIMEN:  No Specimen  DISPOSITION OF SPECIMEN:  N/A  COUNTS:  YES  TOURNIQUET:   Total Tourniquet Time Documented: Forearm (Right) - 19 minutes Total: Forearm (Right) - 19 minutes   DICTATION: .Other Dictation: Dictation Number unknown  PLAN OF CARE: Discharge to home after PACU  PATIENT DISPOSITION:  PACU - hemodynamically stable.   Delay start of Pharmacological VTE agent (>24hrs) due to surgical blood loss or risk of bleeding: not applicable

## 2021-10-01 NOTE — Op Note (Signed)
[name], [name] MEDICAL RECORD NO: [id number] ACCOUNT NO: [id number] DATE OF BIRTH: 1961-08-02 FACILITY: [facility] LOCATION: [facility] PHYSICIAN: W D. Valeta Harms., MD  Operative Report   DATE OF PROCEDURE: 06/30/2021  PREOPERATIVE DIAGNOSIS:  Right carpal tunnel syndrome.  POSTOPERATIVE DIAGNOSIS:  Right carpal tunnel syndrome.  PROCEDURE:  Right carpal tunnel release.  SURGEON:  Dr. French Ana.   ASSISTANT:  Chadwill, PA.  ANESTHESIA: MAC with local.  TOURNIQUET TIME:  Approximately 20 minutes.  PROCEDURE IN DETAIL: Under local anesthetic with Depo-Medrol infiltrated into the hand, we made a curvilinear incision just ulnar to the thenar crease.  Dissection carried down through the transverse carpal ligament through the palmar fascia.  We  dissected distally to the level of the superficial arch proximally and to include the antebrachial fascia of the wrist.  Nerve was noted to have moderate compression.  A complete release of the transverse carpal ligament was obtained.  No other anomalies  noted.  Wound was irrigated, closed with nylon, placed in a lightly compressive dressing, taken to recovery room in stable condition.   MUK D: 10/01/2021 9:30:01 am T: 10/01/2021 9:39:00 am  JOB: [id number]/ [id number]

## 2021-10-02 ENCOUNTER — Encounter (HOSPITAL_BASED_OUTPATIENT_CLINIC_OR_DEPARTMENT_OTHER): Payer: Self-pay | Admitting: Orthopedic Surgery

## 2021-10-03 NOTE — Op Note (Signed)
NAMEGURNOOR, SLOOP MEDICAL RECORD NO: [id number] ACCOUNT NO: [id number] DATE OF BIRTH: Aug 08, 1961 FACILITY: [facility] LOCATION: [facility] PHYSICIAN: W D. Valeta Harms., MD  Operative Report   DATE OF PROCEDURE: 10/01/2021  PREOPERATIVE DIAGNOSIS:  Right carpal tunnel syndrome.  POSTOPERATIVE DIAGNOSIS:  Right carpal tunnel syndrome.  PROCEDURE:  Right carpal tunnel release.  SURGEON:  Yvette Rack., MD  ASSISTANTMarjo Bicker, PA  ANESTHESIA:  Local with sedation.  TOURNIQUET TIME:  10 minutes.  DESCRIPTION OF PROCEDURE:  After infiltration of the palmar aspect of the hand with a mixture of Marcaine without epinephrine and lidocaine, curvilinear incision was made just ulnar to the thenar crease.  We dissected through the palmar fascia and  released the transverse carpal ligament.  Dissection was carried out distally to the level of the superficial arch proximally to include the antebrachial fascia of the wrist.  Nerve showed moderate constriction.  Wound was irrigated, closed with nylon.   A lightly compressive sterile dressing was applied.  Taken to recovery room in stable condition.   SHW D: 10/03/2021 7:54:47 am T: 10/03/2021 9:19:00 am  JOB: [id number]/ [id number]

## 2021-10-13 ENCOUNTER — Other Ambulatory Visit (HOSPITAL_COMMUNITY): Payer: Self-pay

## 2021-10-13 DIAGNOSIS — G5601 Carpal tunnel syndrome, right upper limb: Secondary | ICD-10-CM | POA: Diagnosis not present

## 2021-10-16 ENCOUNTER — Other Ambulatory Visit (HOSPITAL_COMMUNITY): Payer: Self-pay

## 2021-10-16 DIAGNOSIS — M1991 Primary osteoarthritis, unspecified site: Secondary | ICD-10-CM | POA: Diagnosis not present

## 2021-10-16 DIAGNOSIS — K219 Gastro-esophageal reflux disease without esophagitis: Secondary | ICD-10-CM | POA: Diagnosis not present

## 2021-10-16 DIAGNOSIS — Z6841 Body Mass Index (BMI) 40.0 and over, adult: Secondary | ICD-10-CM | POA: Diagnosis not present

## 2021-10-16 DIAGNOSIS — E119 Type 2 diabetes mellitus without complications: Secondary | ICD-10-CM | POA: Diagnosis not present

## 2021-10-16 DIAGNOSIS — I10 Essential (primary) hypertension: Secondary | ICD-10-CM | POA: Diagnosis not present

## 2021-10-16 MED ORDER — ALPRAZOLAM 0.5 MG PO TABS
0.5000 mg | ORAL_TABLET | Freq: Two times a day (BID) | ORAL | 2 refills | Status: DC
  Filled 2021-10-16: qty 60, 30d supply, fill #0
  Filled 2021-11-21: qty 60, 30d supply, fill #1
  Filled 2021-12-23: qty 60, 30d supply, fill #2

## 2021-10-16 MED ORDER — METOPROLOL SUCCINATE ER 100 MG PO TB24
100.0000 mg | ORAL_TABLET | Freq: Every day | ORAL | 0 refills | Status: DC
  Filled 2021-10-16: qty 90, 90d supply, fill #0

## 2021-10-16 MED ORDER — GLIPIZIDE 5 MG PO TABS
5.0000 mg | ORAL_TABLET | Freq: Every day | ORAL | 1 refills | Status: DC
  Filled 2021-10-16 – 2022-01-02 (×2): qty 90, 90d supply, fill #0
  Filled 2022-03-31: qty 90, 90d supply, fill #1

## 2021-10-16 MED ORDER — METFORMIN HCL ER 500 MG PO TB24
500.0000 mg | ORAL_TABLET | Freq: Two times a day (BID) | ORAL | 1 refills | Status: DC
  Filled 2021-10-16: qty 180, 90d supply, fill #0

## 2021-10-16 MED ORDER — HYDROCHLOROTHIAZIDE 12.5 MG PO TABS
12.5000 mg | ORAL_TABLET | Freq: Every day | ORAL | 3 refills | Status: DC
  Filled 2021-10-16: qty 90, 90d supply, fill #0

## 2021-10-16 MED ORDER — LOSARTAN POTASSIUM 100 MG PO TABS
100.0000 mg | ORAL_TABLET | Freq: Every day | ORAL | 1 refills | Status: DC
  Filled 2021-10-16: qty 90, 90d supply, fill #0

## 2021-10-16 MED ORDER — AMLODIPINE BESYLATE 10 MG PO TABS
10.0000 mg | ORAL_TABLET | Freq: Every day | ORAL | 1 refills | Status: DC
  Filled 2021-10-16 – 2022-02-03 (×2): qty 90, 90d supply, fill #0
  Filled 2022-05-05: qty 90, 90d supply, fill #1

## 2021-10-17 ENCOUNTER — Other Ambulatory Visit (HOSPITAL_COMMUNITY): Payer: Self-pay

## 2021-10-20 ENCOUNTER — Other Ambulatory Visit (HOSPITAL_COMMUNITY): Payer: Self-pay

## 2021-10-20 DIAGNOSIS — E119 Type 2 diabetes mellitus without complications: Secondary | ICD-10-CM | POA: Diagnosis not present

## 2021-10-20 DIAGNOSIS — I10 Essential (primary) hypertension: Secondary | ICD-10-CM | POA: Diagnosis not present

## 2021-10-20 MED FILL — Estrogens, Conjugated Tab 0.625 MG: ORAL | 30 days supply | Qty: 30 | Fill #6 | Status: AC

## 2021-10-21 DIAGNOSIS — M1711 Unilateral primary osteoarthritis, right knee: Secondary | ICD-10-CM | POA: Diagnosis not present

## 2021-10-22 ENCOUNTER — Other Ambulatory Visit (HOSPITAL_COMMUNITY): Payer: Self-pay

## 2021-10-23 ENCOUNTER — Other Ambulatory Visit (HOSPITAL_COMMUNITY): Payer: Self-pay

## 2021-11-08 MED FILL — Sitagliptin Phosphate Tab 100 MG (Base Equiv): ORAL | 30 days supply | Qty: 30 | Fill #5 | Status: AC

## 2021-11-10 ENCOUNTER — Other Ambulatory Visit (HOSPITAL_COMMUNITY): Payer: Self-pay

## 2021-11-20 DIAGNOSIS — I10 Essential (primary) hypertension: Secondary | ICD-10-CM | POA: Diagnosis not present

## 2021-11-20 DIAGNOSIS — R350 Frequency of micturition: Secondary | ICD-10-CM | POA: Diagnosis not present

## 2021-11-20 DIAGNOSIS — Z6841 Body Mass Index (BMI) 40.0 and over, adult: Secondary | ICD-10-CM | POA: Diagnosis not present

## 2021-11-21 ENCOUNTER — Other Ambulatory Visit (HOSPITAL_COMMUNITY): Payer: Self-pay

## 2021-11-24 ENCOUNTER — Other Ambulatory Visit (HOSPITAL_COMMUNITY): Payer: Self-pay

## 2021-11-25 ENCOUNTER — Other Ambulatory Visit (HOSPITAL_COMMUNITY): Payer: Self-pay

## 2021-11-26 ENCOUNTER — Other Ambulatory Visit (HOSPITAL_COMMUNITY): Payer: Self-pay

## 2021-11-26 DIAGNOSIS — L668 Other cicatricial alopecia: Secondary | ICD-10-CM | POA: Diagnosis not present

## 2021-11-26 MED ORDER — PREMARIN 0.625 MG PO TABS
0.6250 mg | ORAL_TABLET | Freq: Every day | ORAL | 11 refills | Status: DC
  Filled 2021-11-26: qty 30, 30d supply, fill #0
  Filled 2021-12-23: qty 30, 30d supply, fill #1
  Filled 2022-01-20: qty 30, 30d supply, fill #2
  Filled 2022-02-20: qty 30, 30d supply, fill #3
  Filled 2022-03-24: qty 30, 30d supply, fill #4
  Filled 2022-04-21: qty 30, 30d supply, fill #5
  Filled 2022-05-20: qty 30, 30d supply, fill #6
  Filled 2022-06-21: qty 30, 30d supply, fill #7
  Filled 2022-07-19: qty 30, 30d supply, fill #8
  Filled 2022-08-18: qty 30, 30d supply, fill #9
  Filled 2022-09-17: qty 30, 30d supply, fill #10
  Filled 2022-10-17: qty 30, 30d supply, fill #11

## 2021-11-27 ENCOUNTER — Other Ambulatory Visit (HOSPITAL_COMMUNITY): Payer: Self-pay

## 2021-11-27 DIAGNOSIS — Z6841 Body Mass Index (BMI) 40.0 and over, adult: Secondary | ICD-10-CM | POA: Diagnosis not present

## 2021-11-27 DIAGNOSIS — K219 Gastro-esophageal reflux disease without esophagitis: Secondary | ICD-10-CM | POA: Diagnosis not present

## 2021-11-27 DIAGNOSIS — I10 Essential (primary) hypertension: Secondary | ICD-10-CM | POA: Diagnosis not present

## 2021-11-27 DIAGNOSIS — E119 Type 2 diabetes mellitus without complications: Secondary | ICD-10-CM | POA: Diagnosis not present

## 2021-11-27 MED ORDER — PREMARIN 0.625 MG PO TABS: 0.6250 mg | ORAL_TABLET | Freq: Every day | ORAL | 9 refills | Status: DC

## 2021-11-28 ENCOUNTER — Other Ambulatory Visit (HOSPITAL_COMMUNITY): Payer: Self-pay

## 2021-12-05 ENCOUNTER — Other Ambulatory Visit (HOSPITAL_COMMUNITY): Payer: Self-pay

## 2021-12-12 ENCOUNTER — Other Ambulatory Visit (HOSPITAL_COMMUNITY): Payer: Self-pay

## 2021-12-23 ENCOUNTER — Other Ambulatory Visit (HOSPITAL_COMMUNITY): Payer: Self-pay

## 2021-12-23 MED ORDER — JANUVIA 100 MG PO TABS
100.0000 mg | ORAL_TABLET | Freq: Every day | ORAL | 0 refills | Status: DC
  Filled 2021-12-23: qty 90, 90d supply, fill #0

## 2021-12-24 ENCOUNTER — Ambulatory Visit (INDEPENDENT_AMBULATORY_CARE_PROVIDER_SITE_OTHER): Payer: 59

## 2021-12-24 ENCOUNTER — Other Ambulatory Visit (HOSPITAL_COMMUNITY): Payer: Self-pay

## 2021-12-24 ENCOUNTER — Ambulatory Visit: Payer: 59 | Admitting: Podiatry

## 2021-12-24 ENCOUNTER — Other Ambulatory Visit: Payer: Self-pay

## 2021-12-24 DIAGNOSIS — M779 Enthesopathy, unspecified: Secondary | ICD-10-CM

## 2021-12-24 DIAGNOSIS — M722 Plantar fascial fibromatosis: Secondary | ICD-10-CM

## 2021-12-24 DIAGNOSIS — M76822 Posterior tibial tendinitis, left leg: Secondary | ICD-10-CM

## 2021-12-24 MED ORDER — TRIAMCINOLONE ACETONIDE 10 MG/ML IJ SUSP
10.0000 mg | Freq: Once | INTRAMUSCULAR | Status: AC
  Administered 2021-12-24: 10 mg

## 2021-12-24 NOTE — Progress Notes (Signed)
Subjective:   Patient ID: [name], female   DOB: 61 y.o.   MRN: [id number]   HPI Patient presents with history of Planter fasciitis left that been bothering her and the arch and is developed a lot of pain in her left ankle over the last 6 months.  She is moderately obese and tries to be active but cannot do this well and states it seems to be getting worse over the last couple months.  Patient does not smoke likes to be active   Review of Systems  All other systems reviewed and are negative.      Objective:  Physical Exam Vitals and nursing note reviewed.  Constitutional:      Appearance: She is well-developed.  Pulmonary:     Effort: Pulmonary effort is normal.  Musculoskeletal:        General: Normal range of motion.  Skin:    General: Skin is warm.  Neurological:     Mental Status: She is alert.    Neurovascular status intact muscle strength found to be adequate range of motion adequate with patient found to have pain and swelling of the posterior tibial tendon as it inserts into the navicular with mild edema associated with the but no indication of muscle strength loss with moderate discomfort in the mid arch area also noted.  Patient is found to have good digital perfusion well oriented x3 with moderate flatfoot deformity     Assessment:  Acute posterior tibial tendinitis left with inflammation and most likely not torn along with mid arch fasciitis and foot structural issues     Plan:  H&P reviewed all 3 conditions and at this point organ to focus on the posterior tibial and I did do sterile prep and injected the sheath of the posterior tibial 3 mg Dexasone Kenalog 5 mg Xylocaine and applied fascial brace with instructions on usage.  Patient does have a boot at home and will wear that as needed to try to give better support and will be seen back and most likely long-term will need orthotic therapy that I reviewed today  X-rays indicate that there is moderate  depression of the arch with spur formation no indications of arthritis or fracture associated with acute pain

## 2021-12-25 ENCOUNTER — Other Ambulatory Visit (HOSPITAL_COMMUNITY): Payer: Self-pay

## 2021-12-26 ENCOUNTER — Other Ambulatory Visit (HOSPITAL_COMMUNITY): Payer: Self-pay

## 2021-12-29 ENCOUNTER — Other Ambulatory Visit: Payer: Self-pay | Admitting: Podiatry

## 2021-12-29 ENCOUNTER — Other Ambulatory Visit (HOSPITAL_COMMUNITY): Payer: Self-pay

## 2021-12-29 DIAGNOSIS — M779 Enthesopathy, unspecified: Secondary | ICD-10-CM

## 2021-12-31 ENCOUNTER — Other Ambulatory Visit (HOSPITAL_COMMUNITY): Payer: Self-pay

## 2022-01-01 ENCOUNTER — Other Ambulatory Visit (HOSPITAL_COMMUNITY): Payer: Self-pay

## 2022-01-01 MED ORDER — JANUVIA 100 MG PO TABS
100.0000 mg | ORAL_TABLET | Freq: Every day | ORAL | 1 refills | Status: DC
  Filled 2022-01-01: qty 30, 30d supply, fill #0

## 2022-01-02 ENCOUNTER — Other Ambulatory Visit (HOSPITAL_COMMUNITY): Payer: Self-pay

## 2022-01-05 ENCOUNTER — Other Ambulatory Visit (HOSPITAL_COMMUNITY): Payer: Self-pay

## 2022-01-05 MED ORDER — LOSARTAN POTASSIUM 100 MG PO TABS
100.0000 mg | ORAL_TABLET | Freq: Every day | ORAL | 1 refills | Status: DC
  Filled 2022-01-05: qty 90, 90d supply, fill #0

## 2022-01-07 ENCOUNTER — Ambulatory Visit (INDEPENDENT_AMBULATORY_CARE_PROVIDER_SITE_OTHER): Payer: 59 | Admitting: Family Medicine

## 2022-01-07 ENCOUNTER — Other Ambulatory Visit (HOSPITAL_COMMUNITY): Payer: Self-pay

## 2022-01-07 ENCOUNTER — Other Ambulatory Visit: Payer: Self-pay

## 2022-01-07 ENCOUNTER — Encounter (INDEPENDENT_AMBULATORY_CARE_PROVIDER_SITE_OTHER): Payer: Self-pay | Admitting: Family Medicine

## 2022-01-07 VITALS — BP 179/79 | HR 79 | Temp 97.8°F | Ht 63.0 in | Wt 243.0 lb

## 2022-01-07 DIAGNOSIS — E1159 Type 2 diabetes mellitus with other circulatory complications: Secondary | ICD-10-CM | POA: Diagnosis not present

## 2022-01-07 DIAGNOSIS — E785 Hyperlipidemia, unspecified: Secondary | ICD-10-CM | POA: Diagnosis not present

## 2022-01-07 DIAGNOSIS — E559 Vitamin D deficiency, unspecified: Secondary | ICD-10-CM

## 2022-01-07 DIAGNOSIS — E038 Other specified hypothyroidism: Secondary | ICD-10-CM

## 2022-01-07 DIAGNOSIS — E669 Obesity, unspecified: Secondary | ICD-10-CM

## 2022-01-07 DIAGNOSIS — E538 Deficiency of other specified B group vitamins: Secondary | ICD-10-CM

## 2022-01-07 DIAGNOSIS — E65 Localized adiposity: Secondary | ICD-10-CM | POA: Diagnosis not present

## 2022-01-07 DIAGNOSIS — M2550 Pain in unspecified joint: Secondary | ICD-10-CM

## 2022-01-07 DIAGNOSIS — R0602 Shortness of breath: Secondary | ICD-10-CM

## 2022-01-07 DIAGNOSIS — E1169 Type 2 diabetes mellitus with other specified complication: Secondary | ICD-10-CM | POA: Diagnosis not present

## 2022-01-07 DIAGNOSIS — I152 Hypertension secondary to endocrine disorders: Secondary | ICD-10-CM | POA: Diagnosis not present

## 2022-01-07 DIAGNOSIS — F3289 Other specified depressive episodes: Secondary | ICD-10-CM | POA: Diagnosis not present

## 2022-01-07 DIAGNOSIS — Z6841 Body Mass Index (BMI) 40.0 and over, adult: Secondary | ICD-10-CM

## 2022-01-07 DIAGNOSIS — Z7984 Long term (current) use of oral hypoglycemic drugs: Secondary | ICD-10-CM

## 2022-01-07 DIAGNOSIS — Z789 Other specified health status: Secondary | ICD-10-CM

## 2022-01-07 DIAGNOSIS — R5383 Other fatigue: Secondary | ICD-10-CM

## 2022-01-07 MED ORDER — TIRZEPATIDE 2.5 MG/0.5ML ~~LOC~~ SOAJ
2.5000 mg | SUBCUTANEOUS | 0 refills | Status: DC
  Filled 2022-01-07: qty 2, 28d supply, fill #0

## 2022-01-07 MED ORDER — MELOXICAM 7.5 MG PO TABS
7.5000 mg | ORAL_TABLET | Freq: Every day | ORAL | 0 refills | Status: DC
  Filled 2022-01-07 – 2022-01-16 (×2): qty 30, 30d supply, fill #0

## 2022-01-08 ENCOUNTER — Ambulatory Visit: Payer: 59 | Admitting: Podiatry

## 2022-01-08 ENCOUNTER — Other Ambulatory Visit (HOSPITAL_COMMUNITY): Payer: Self-pay

## 2022-01-08 LAB — COMPREHENSIVE METABOLIC PANEL
ALT: 11 IU/L (ref 0–32)
AST: 14 IU/L (ref 0–40)
Albumin/Globulin Ratio: 1.3 (ref 1.2–2.2)
Albumin: 4 g/dL (ref 3.8–4.8)
Alkaline Phosphatase: 55 IU/L (ref 44–121)
BUN/Creatinine Ratio: 17 (ref 12–28)
BUN: 10 mg/dL (ref 8–27)
Bilirubin Total: <0.2 mg/dL (ref 0.0–1.2)
CO2: 22 mmol/L (ref 20–29)
Calcium: 9.4 mg/dL (ref 8.7–10.3)
Chloride: 101 mmol/L (ref 96–106)
Creatinine, Ser: 0.6 mg/dL (ref 0.57–1.00)
Globulin, Total: 3.1 g/dL (ref 1.5–4.5)
Glucose: 100 mg/dL — ABNORMAL HIGH (ref 70–99)
Potassium: 3.9 mmol/L (ref 3.5–5.2)
Sodium: 140 mmol/L (ref 134–144)
Total Protein: 7.1 g/dL (ref 6.0–8.5)
eGFR: 101 mL/min/{1.73_m2} (ref >59)

## 2022-01-08 LAB — T4, FREE: Free T4: 1.39 ng/dL (ref 0.82–1.77)

## 2022-01-08 LAB — CBC WITH DIFFERENTIAL/PLATELET
Basophils Absolute: 0 10*3/uL (ref 0.0–0.2)
Basos: 0 %
EOS (ABSOLUTE): 0.2 10*3/uL (ref 0.0–0.4)
Eos: 3 %
Hematocrit: 38.7 % (ref 34.0–46.6)
Hemoglobin: 13.7 g/dL (ref 11.1–15.9)
Immature Grans (Abs): 0 10*3/uL (ref 0.0–0.1)
Immature Granulocytes: 0 %
Lymphocytes Absolute: 2.8 10*3/uL (ref 0.7–3.1)
Lymphs: 36 %
MCH: 30.6 pg (ref 26.6–33.0)
MCHC: 35.4 g/dL (ref 31.5–35.7)
MCV: 87 fL (ref 79–97)
Monocytes Absolute: 0.6 10*3/uL (ref 0.1–0.9)
Monocytes: 8 %
Neutrophils Absolute: 4 10*3/uL (ref 1.4–7.0)
Neutrophils: 53 %
Platelets: 316 10*3/uL (ref 150–450)
RBC: 4.47 x10E6/uL (ref 3.77–5.28)
RDW: 13.1 % (ref 11.7–15.4)
WBC: 7.8 10*3/uL (ref 3.4–10.8)

## 2022-01-08 LAB — C-REACTIVE PROTEIN: CRP: 8 mg/L (ref 0–10)

## 2022-01-08 LAB — VITAMIN D 25 HYDROXY (VIT D DEFICIENCY, FRACTURES): Vit D, 25-Hydroxy: 20.6 ng/mL — ABNORMAL LOW (ref 30.0–100.0)

## 2022-01-08 LAB — VITAMIN B12: Vitamin B-12: 289 pg/mL (ref 232–1245)

## 2022-01-08 LAB — TSH: TSH: 0.553 u[IU]/mL (ref 0.450–4.500)

## 2022-01-08 LAB — SEDIMENTATION RATE: Sed Rate: 32 mm/hr (ref 0–40)

## 2022-01-08 MED ORDER — LOSARTAN POTASSIUM 100 MG PO TABS
100.0000 mg | ORAL_TABLET | Freq: Every day | ORAL | 1 refills | Status: DC
  Filled 2022-01-08 – 2022-03-31 (×2): qty 90, 90d supply, fill #0
  Filled 2022-06-21: qty 90, 90d supply, fill #1

## 2022-01-13 NOTE — Progress Notes (Signed)
Chief Complaint:   OBESITY [name] (MR# [id number]) is a 61 y.o. female who presents for evaluation and treatment of obesity and related comorbidities. Current BMI is Body mass index is 43.05 kg/m. [name] has been struggling with her weight for many years and has been unsuccessful in either losing weight, maintaining weight loss, or reaching her healthy weight goal.  [name] is currently in the action stage of change and ready to dedicate time achieving and maintaining a healthier weight. [name] is interested in becoming our patient and working on intensive lifestyle modifications including (but not limited to) diet and exercise for weight loss.  [name]'s habits were reviewed today and are as follows: she thinks her family will eat healthier with her, her desired weight loss is 40 pounds, she has been heavy most of her life, she started gaining weight after pregnancy in 1983, her heaviest weight ever was 356 pounds, she is a picky eater and doesn't like to eat healthier foods, she craves bread, pasta, and pastry, she snacks frequently in the evenings, she wakes up frequently in the middle of the night to eat, she skips lunch frequently, she is frequently drinking liquids with calories, she has problems with excessive hunger, she frequently eats larger portions than normal, and she struggles with emotional eating.  Depression Screen [name]'s Food and Mood (modified PHQ-9) score was 9.  Depression screen PHQ 2/9 01/07/2022  Decreased Interest 1  Down, Depressed, Hopeless 1  PHQ - 2 Score 2  Altered sleeping 1  Tired, decreased energy 3  Change in appetite 2  Feeling bad or failure about yourself  1  Trouble concentrating 0  Moving slowly or fidgety/restless 0  Suicidal thoughts 0  PHQ-9 Score 9  Difficult doing work/chores Not difficult at all  Some encounter information is confidential and restricted. Go to Review Flowsheets activity to see all data.   Assessment/Plan:    1. Other fatigue [name] denies daytime somnolence and reports waking up still tired. Patient has a history of symptoms of morning fatigue, morning headache, and snoring. [name] generally gets 7 or 8 hours of sleep per night, and states that she has generally restful sleep. Snoring is present. Apneic episodes are not present. Epworth Sleepiness Score is 3.   [name] does feel that her weight is causing her energy to be lower than it should be. Fatigue may be related to obesity, depression or many other causes. Labs will be ordered, and in the meanwhile, [name] will focus on self care including making healthy food choices, increasing physical activity and focusing on stress reduction.  - CBC with Differential/Platelet - Comprehensive metabolic panel - T4, free - TSH  2. SOBOE (shortness of breath on exertion) [name] notes increasing shortness of breath with exercising and seems to be worsening over time with weight gain. She notes getting out of breath sooner with activity than she used to. This has gotten worse recently. [name] denies shortness of breath at rest or orthopnea.  [name] does feel that she gets out of breath more easily that she used to when she exercises. [name]'s shortness of breath appears to be obesity related and exercise induced. She has agreed to work on weight loss and gradually increase exercise to treat her exercise induced shortness of breath. Will continue to monitor closely.  3. Type 2 diabetes mellitus with other specified complication, without long-term current use of insulin (HCC) Diabetes Mellitus: Not at goal. Medication: glipizide 5 mg daily, metformin XR 500 mg  twice daily, and Januvia 100 mg daily. BS labile but around the low 100s.  She has a history of being on Victoza. Issues reviewed: blood sugar goals, complications of diabetes mellitus, hypoglycemia prevention and treatment, exercise, and nutrition.  Plan:  Stop Januvia.  Continue metformin and glipizide.   Start Mounjaro 2.5 mg subcutaneously weekly.  The patient will continue to focus on protein-rich, low simple carbohydrate foods. We reviewed the importance of hydration, regular exercise for stress reduction, and restorative sleep.   Lab Results  Component Value Date   HGBA1C 6.9 (H) 06/30/2021   HGBA1C 7.0 (H) 06/20/2020   HGBA1C 7.6 09/02/2016   Lab Results  Component Value Date   LDLCALC 101 (H) 06/30/2021   CREATININE 0.60 01/07/2022   - Start tirzepatide [facility]) 2.5 MG/0.5ML Pen; Inject 2.5 mg into the skin once a week.  Dispense: 2 mL; Refill: 0  4. Hypertension associated with type 2 diabetes mellitus (HCC) Elevated today.  She has not taken her blood pressure medications today.  Generally 160s-170s.  Medications: Norvasc 10 mg daily, HCTZ 12.5 mg daily, Cozaar 100 mg daily, metoprolol 100 mg daily.   Plan:  Take current BP medications with no change in treatment.  Avoid buying foods that are: processed, frozen, or prepackaged to avoid excess salt. We will watch for signs of hypotension as she continues lifestyle modifications.  BP Readings from Last 3 Encounters:  01/07/22 (!) 179/79  10/01/21 (!) 141/66  08/01/21 (!) 172/94   Lab Results  Component Value Date   CREATININE 0.60 01/07/2022   5. Hyperlipidemia associated with type 2 diabetes mellitus ([place]) Course: Not optimized. Lipid-lowering medications: None.   Plan: Dietary changes: Increase soluble fiber, decrease simple carbohydrates, decrease saturated fat. Exercise changes: Moderate to vigorous-intensity aerobic activity 150 minutes per week or as tolerated. We will continue to monitor along with PCP/specialists as it pertains to her weight loss journey.  Lab Results  Component Value Date   CHOL 197 06/30/2021   HDL 69 06/30/2021   LDLCALC 101 (H) 06/30/2021   TRIG 133 06/30/2021   CHOLHDL 2.9 06/30/2021   Lab Results  Component Value Date   ALT 11 01/07/2022   AST 14 01/07/2022   ALKPHOS 55 01/07/2022    BILITOT <0.2 01/07/2022   The 10-year ASCVD risk score (Arnett DK, et al., 2019) is: 34.9%   Values used to calculate the score:     Age: 11 years     Sex: Female     Is Non-Hispanic African American: Yes     Diabetic: Yes     Tobacco smoker: No     Systolic Blood Pressure: 831 mmHg     Is BP treated: Yes     HDL Cholesterol: 69 mg/dL     Total Cholesterol: 197 mg/dL  6. Visceral obesity Current visceral fat rating: 18. Visceral fat rating goal is < 13. Visceral adipose tissue is a hormonally active component of total body fat. This body composition phenotype is associated with medical disorders such as metabolic syndrome, cardiovascular disease, and several malignancies including prostate, breast, and colorectal cancers. Starting goal: Lose 7-10% of starting weight.   7. Subclinical hypothyroidism Medication: None.   Plan: We will continue to monitor alongside Endocrinology/PCP as it relates to her weight loss journey.   8. Statin intolerance [name] is unable to take a statin for hyperlipidemia.  9. Multiple joint pain She is taking tramadol 50 mg every 6 hours as needed for pain and Tylenol as needed.  Plan:  Will check labs today and she will start Mobic 7.5 mg daily.  - Sedimentation rate - C-reactive protein - Start meloxicam (MOBIC) 7.5 MG tablet; Take 1 tablet (7.5 mg total) by mouth daily.  Dispense: 30 tablet; Refill: 0  10. B12 deficiency  Supplementation: None.   Plan:  Will check vitamin B12 level today.    - Vitamin B12  11. Vitamin D deficiency Not at goal.   Plan: We will check vitamin D level today.  Lab Results  Component Value Date   VD25OH 22.85 (L) 06/30/2021   VD25OH 12.2 (L) 06/20/2020   - VITAMIN D 25 Hydroxy (Vit-D Deficiency, Fractures)  12. Other depression, with emotional eating Not at goal. Medication: None.   Plan: Discussed cues and consequences, how thoughts affect eating, model of thoughts, feelings, and behaviors, and  strategies for change by focusing on the cue. Discussed cognitive distortions, coping thoughts, and how to change your thoughts.  13. Class 3 severe obesity with serious comorbidity and body mass index (BMI) of 40.0 to 44.9 in adult, unspecified obesity type [facility])  [name] is currently in the action stage of change and her goal is to continue with weight loss efforts. I recommend [name] begin the structured treatment plan as follows:  She has agreed to the Category 2 Plan.  Exercise goals: No exercise has been prescribed at this time.   Behavioral modification strategies: increasing lean protein intake, decreasing simple carbohydrates, increasing vegetables, increasing water intake, and decreasing liquid calories.  She was informed of the importance of frequent follow-up visits to maximize her success with intensive lifestyle modifications for her multiple health conditions. She was informed we would discuss her lab results at her next visit unless there is a critical issue that needs to be addressed sooner. Loghan agreed to keep her next visit at the agreed upon time to discuss these results.  Objective:   Blood pressure (!) 179/79, pulse 79, temperature 97.8 F (36.6 C), height 5\' 3"  (1.6 m), weight 243 lb (110.2 kg), SpO2 99 %. Body mass index is 43.05 kg/m.  Indirect Calorimeter completed today shows a VO2 of 286 and a REE of 1973.  Her calculated basal metabolic rate is 4270 thus her basal metabolic rate is better than expected.  General: Cooperative, alert, well developed, in no acute distress. HEENT: Conjunctivae and lids unremarkable. Cardiovascular: Regular rhythm.  Lungs: Normal work of breathing. Neurologic: No focal deficits.   Lab Results  Component Value Date   CREATININE 0.60 01/07/2022   BUN 10 01/07/2022   NA 140 01/07/2022   K 3.9 01/07/2022   CL 101 01/07/2022   CO2 22 01/07/2022   Lab Results  Component Value Date   ALT 11 01/07/2022   AST 14 01/07/2022    ALKPHOS 55 01/07/2022   BILITOT <0.2 01/07/2022   Lab Results  Component Value Date   HGBA1C 6.9 (H) 06/30/2021   HGBA1C 7.0 (H) 06/20/2020   HGBA1C 7.6 09/02/2016   HGBA1C 6.9 10/21/2015   HGBA1C 7.8 (H) 06/04/2015   Lab Results  Component Value Date   TSH 0.553 01/07/2022   Lab Results  Component Value Date   CHOL 197 06/30/2021   HDL 69 06/30/2021   LDLCALC 101 (H) 06/30/2021   TRIG 133 06/30/2021   CHOLHDL 2.9 06/30/2021   Lab Results  Component Value Date   VD25OH 20.6 (L) 01/07/2022   VD25OH 22.85 (L) 06/30/2021   VD25OH 12.2 (L) 06/20/2020   Lab Results  Component Value  Date   WBC 7.8 01/07/2022   HGB 13.7 01/07/2022   HCT 38.7 01/07/2022   MCV 87 01/07/2022   PLT 316 01/07/2022   Lab Results  Component Value Date   IRON 59 06/20/2020   TIBC 429 06/20/2020   FERRITIN 74 06/20/2020   Attestation Statements:   This is the patient's first visit at [facility]. The patient's NEW PATIENT PACKET was reviewed at length. Included in the packet: current and past health history, medications, allergies, ROS, gynecologic history (women only), surgical history, family history, social history, weight history, weight loss surgery history (for those that have had weight loss surgery), nutritional evaluation, mood and food questionnaire, PHQ9, Epworth questionnaire, sleep habits questionnaire, patient life and health improvement goals questionnaire. These will all be scanned into the patient's chart under media.   During the visit, I independently reviewed the patient's EKG, bioimpedance scale results, and indirect calorimeter results. I used this information to tailor a meal plan for the patient that will help her to lose weight and will improve her obesity-related conditions going forward. I performed a medically necessary appropriate examination and/or evaluation. I discussed the assessment and treatment plan with the patient. The patient was provided an  opportunity to ask questions and all were answered. The patient agreed with the plan and demonstrated an understanding of the instructions. Labs were ordered at this visit and will be reviewed at the next visit unless more critical results need to be addressed immediately. Clinical information was updated and documented in the EMR.   Time spent on visit including pre-visit chart review and post-visit care and documentation was 44 minutes.  I, Water quality scientist, CMA, am acting as transcriptionist for Briscoe Deutscher, DO  I have reviewed the above documentation for accuracy and completeness, and I agree with the above. - Briscoe Deutscher, DO, MS, FAAFP, DABOM - Family and Bariatric Medicine.

## 2022-01-15 ENCOUNTER — Other Ambulatory Visit (HOSPITAL_COMMUNITY): Payer: Self-pay

## 2022-01-15 ENCOUNTER — Ambulatory Visit: Payer: 59 | Admitting: Podiatry

## 2022-01-16 ENCOUNTER — Other Ambulatory Visit: Payer: Self-pay

## 2022-01-16 ENCOUNTER — Other Ambulatory Visit (HOSPITAL_COMMUNITY): Payer: Self-pay

## 2022-01-16 ENCOUNTER — Encounter: Payer: Self-pay | Admitting: Podiatry

## 2022-01-16 ENCOUNTER — Ambulatory Visit: Payer: 59 | Admitting: Podiatry

## 2022-01-16 DIAGNOSIS — M76822 Posterior tibial tendinitis, left leg: Secondary | ICD-10-CM | POA: Diagnosis not present

## 2022-01-16 NOTE — Progress Notes (Signed)
Subjective:   Patient ID: [name], female   DOB: 61 y.o.   MRN: [id number]   HPI Patient states she has had some improvement in her symptoms but admits she is not worn the boot as we had asked her to.  States that she still gets some swelling   ROS      Objective:  Physical Exam  Neurovascular status intact with continued discomfort in the posterior tibial tendon as it comes under the medial malleolus with moderate flatfoot deformity noted     Assessment:  Considerations for inflamed tendon versus torn tendon the left     Plan:  Discussed the considerations for MRI we [place] to hold off for another 3 to 4 weeks she is can start oral anti-inflammatory and also she will wear her boot full-time for these weeks.  If symptoms were to get worse patient will need to be seen back

## 2022-01-20 ENCOUNTER — Other Ambulatory Visit (HOSPITAL_COMMUNITY): Payer: Self-pay

## 2022-01-21 ENCOUNTER — Other Ambulatory Visit (HOSPITAL_COMMUNITY): Payer: Self-pay

## 2022-01-22 ENCOUNTER — Other Ambulatory Visit (HOSPITAL_COMMUNITY): Payer: Self-pay

## 2022-01-22 MED ORDER — ALPRAZOLAM 0.5 MG PO TABS
0.5000 mg | ORAL_TABLET | Freq: Two times a day (BID) | ORAL | 0 refills | Status: DC
  Filled 2022-01-22: qty 60, 30d supply, fill #0

## 2022-01-28 ENCOUNTER — Other Ambulatory Visit: Payer: Self-pay

## 2022-01-28 ENCOUNTER — Ambulatory Visit (INDEPENDENT_AMBULATORY_CARE_PROVIDER_SITE_OTHER): Payer: 59 | Admitting: Family Medicine

## 2022-01-28 ENCOUNTER — Other Ambulatory Visit (HOSPITAL_COMMUNITY): Payer: Self-pay

## 2022-01-28 ENCOUNTER — Encounter (INDEPENDENT_AMBULATORY_CARE_PROVIDER_SITE_OTHER): Payer: Self-pay | Admitting: Family Medicine

## 2022-01-28 VITALS — BP 167/78 | HR 81 | Temp 98.0°F | Ht 63.0 in | Wt 241.0 lb

## 2022-01-28 DIAGNOSIS — Z6841 Body Mass Index (BMI) 40.0 and over, adult: Secondary | ICD-10-CM

## 2022-01-28 DIAGNOSIS — E538 Deficiency of other specified B group vitamins: Secondary | ICD-10-CM

## 2022-01-28 DIAGNOSIS — Z7985 Long-term (current) use of injectable non-insulin antidiabetic drugs: Secondary | ICD-10-CM | POA: Diagnosis not present

## 2022-01-28 DIAGNOSIS — E559 Vitamin D deficiency, unspecified: Secondary | ICD-10-CM

## 2022-01-28 DIAGNOSIS — E1169 Type 2 diabetes mellitus with other specified complication: Secondary | ICD-10-CM | POA: Diagnosis not present

## 2022-01-28 DIAGNOSIS — E669 Obesity, unspecified: Secondary | ICD-10-CM

## 2022-01-28 MED ORDER — TIRZEPATIDE 5 MG/0.5ML ~~LOC~~ SOAJ
5.0000 mg | SUBCUTANEOUS | 1 refills | Status: DC
  Filled 2022-01-28: qty 2, 28d supply, fill #0

## 2022-01-28 MED ORDER — CYANOCOBALAMIN 1000 MCG/ML IJ SOLN
INTRAMUSCULAR | 0 refills | Status: AC
  Filled 2022-01-28: qty 10, 84d supply, fill #0

## 2022-01-28 MED ORDER — VITAMIN D (ERGOCALCIFEROL) 1.25 MG (50000 UNIT) PO CAPS
50000.0000 [IU] | ORAL_CAPSULE | ORAL | 0 refills | Status: AC
  Filled 2022-01-28: qty 12, 84d supply, fill #0

## 2022-01-28 MED ORDER — "SYRINGE/NEEDLE (DISP) 25G X 1"" 3 ML MISC"
0 refills | Status: DC
  Filled 2022-01-28: qty 10, 30d supply, fill #0

## 2022-01-29 ENCOUNTER — Other Ambulatory Visit (HOSPITAL_COMMUNITY): Payer: Self-pay

## 2022-01-29 NOTE — Progress Notes (Signed)
Chief Complaint:   OBESITY [name] is here to discuss her progress with her obesity treatment plan along with follow-up of her obesity related diagnoses. See Medical Weight Management Flowsheet for complete bioelectrical impedance results.  Today's visit was #: 2 Starting weight: 243 lbs Starting date: 01/07/2022 Weight change since last visit: 2 lbs Total lbs lost to date: 2 lbs Total weight loss percentage to date: -0.82%  Nutrition Plan: Category 2 Plan for 40-50% of the time.  Activity: None. Anti-obesity medications: Mounjaro 2.5 mg subcutaneously weekly. Reported side effects: None.  Interim History: [name] says she is happy with Mounjaro.  She says it is controlling her appetite and blood sugars.  Denies side effects.  Assessment/Plan:   1. B12 deficiency Supplementation: None.  Plan:  Start vitamin B12 injection once weekly for 8 weeks, then every 2 weeks.     Lab Results  Component Value Date   VITAMINB12 289 01/07/2022   - Start cyanocobalamin (,VITAMIN B-12,) 1000 MCG/ML injection; Inject 1 ml weekly for 8 weeks, then 1 ml every 2 weeks  Dispense: 10 mL; Refill: 0 - Start SYRINGE-NEEDLE, DISP, 3 ML 25G X 1" 3 ML MISC; Use to administer b12 injections  Dispense: 10 each; Refill: 0  2. Vitamin D deficiency Not at goal. She is not currently on a vitamin D supplement.  Plan: Start to take prescription Vitamin D @50 ,000 IU every week as prescribed.  Follow-up for routine testing of Vitamin D, at least 2-3 times per year to avoid over-replacement.  Lab Results  Component Value Date   VD25OH 20.6 (L) 01/07/2022   VD25OH 22.85 (L) 06/30/2021   VD25OH 12.2 (L) 06/20/2020   - Start Vitamin D, Ergocalciferol, (DRISDOL) 1.25 MG (50000 UNIT) CAPS capsule; Take 1 capsule (50,000 Units total) by mouth every 7 (seven) days.  Dispense: 12 capsule; Refill: 0  3. Type 2 diabetes mellitus with other specified complication, without long-term current use of insulin  (HCC) Diabetes Mellitus: Not at goal. Medication: Mounjaro 2.5 mg subcutaneously weekly.  Januvia stopped.  BS 110-130. Issues reviewed: blood sugar goals, complications of diabetes mellitus, hypoglycemia prevention and treatment, exercise, and nutrition.  Plan:  Increase Mounjaro to 5 mg subcutaneously weekly, as per below. The patient will continue to focus on protein-rich, low simple carbohydrate foods. We reviewed the importance of hydration, regular exercise for stress reduction, and restorative sleep.   Lab Results  Component Value Date   HGBA1C 6.9 (H) 06/30/2021   HGBA1C 7.0 (H) 06/20/2020   HGBA1C 7.6 09/02/2016   Lab Results  Component Value Date   LDLCALC 101 (H) 06/30/2021   CREATININE 0.60 01/07/2022   - Increase tirzepatide (MOUNJARO) 5 MG/0.5ML Pen; Inject 5 mg into the skin once a week.  Dispense: 6 mL; Refill: 1  4. Obesity, current BMI 42.8  Course: [name] is currently in the action stage of change. As such, her goal is to continue with weight loss efforts.   Nutrition goals: She has agreed to the Category 2 Plan.   Exercise goals:  As is.  Behavioral modification strategies: increasing lean protein intake, decreasing simple carbohydrates, increasing vegetables, increasing water intake, and decreasing liquid calories.  [name] has agreed to follow-up with our clinic in 4 weeks. She was informed of the importance of frequent follow-up visits to maximize her success with intensive lifestyle modifications for her multiple health conditions.   Objective:   Blood pressure (!) 167/78, pulse 81, temperature 98 F (36.7 C), temperature source Oral, height 5'  3" (1.6 m), weight 241 lb (109.3 kg), SpO2 95 %. Body mass index is 42.69 kg/m.  General: Cooperative, alert, well developed, in no acute distress. HEENT: Conjunctivae and lids unremarkable. Cardiovascular: Regular rhythm.  Lungs: Normal work of breathing. Neurologic: No focal deficits.   Lab Results   Component Value Date   CREATININE 0.60 01/07/2022   BUN 10 01/07/2022   NA 140 01/07/2022   K 3.9 01/07/2022   CL 101 01/07/2022   CO2 22 01/07/2022   Lab Results  Component Value Date   ALT 11 01/07/2022   AST 14 01/07/2022   ALKPHOS 55 01/07/2022   BILITOT <0.2 01/07/2022   Lab Results  Component Value Date   HGBA1C 6.9 (H) 06/30/2021   HGBA1C 7.0 (H) 06/20/2020   HGBA1C 7.6 09/02/2016   HGBA1C 6.9 10/21/2015   HGBA1C 7.8 (H) 06/04/2015   Lab Results  Component Value Date   TSH 0.553 01/07/2022   Lab Results  Component Value Date   CHOL 197 06/30/2021   HDL 69 06/30/2021   LDLCALC 101 (H) 06/30/2021   TRIG 133 06/30/2021   CHOLHDL 2.9 06/30/2021   Lab Results  Component Value Date   VD25OH 20.6 (L) 01/07/2022   VD25OH 22.85 (L) 06/30/2021   VD25OH 12.2 (L) 06/20/2020   Lab Results  Component Value Date   WBC 7.8 01/07/2022   HGB 13.7 01/07/2022   HCT 38.7 01/07/2022   MCV 87 01/07/2022   PLT 316 01/07/2022   Lab Results  Component Value Date   IRON 59 06/20/2020   TIBC 429 06/20/2020   FERRITIN 74 06/20/2020   Attestation Statements:   Reviewed by clinician on day of visit: allergies, medications, problem list, medical history, surgical history, family history, social history, and previous encounter notes.  I, Water quality scientist, CMA, am acting as transcriptionist for Briscoe Deutscher, DO  I have reviewed the above documentation for accuracy and completeness, and I agree with the above. -  Briscoe Deutscher, DO, MS, FAAFP, DABOM - Family and Bariatric Medicine.

## 2022-02-03 ENCOUNTER — Other Ambulatory Visit (HOSPITAL_COMMUNITY): Payer: Self-pay

## 2022-02-04 ENCOUNTER — Other Ambulatory Visit (HOSPITAL_COMMUNITY): Payer: Self-pay

## 2022-02-04 DIAGNOSIS — L249 Irritant contact dermatitis, unspecified cause: Secondary | ICD-10-CM | POA: Diagnosis not present

## 2022-02-04 DIAGNOSIS — L668 Other cicatricial alopecia: Secondary | ICD-10-CM | POA: Diagnosis not present

## 2022-02-04 MED ORDER — LEVOCETIRIZINE DIHYDROCHLORIDE 5 MG PO TABS
5.0000 mg | ORAL_TABLET | Freq: Every evening | ORAL | 2 refills | Status: DC
  Filled 2022-02-04: qty 90, 90d supply, fill #0

## 2022-02-09 ENCOUNTER — Other Ambulatory Visit (HOSPITAL_COMMUNITY): Payer: Self-pay

## 2022-02-09 ENCOUNTER — Encounter (INDEPENDENT_AMBULATORY_CARE_PROVIDER_SITE_OTHER): Payer: Self-pay

## 2022-02-09 ENCOUNTER — Other Ambulatory Visit (INDEPENDENT_AMBULATORY_CARE_PROVIDER_SITE_OTHER): Payer: Self-pay

## 2022-02-09 DIAGNOSIS — E1169 Type 2 diabetes mellitus with other specified complication: Secondary | ICD-10-CM

## 2022-02-09 MED ORDER — ONDANSETRON 4 MG PO TBDP
4.0000 mg | ORAL_TABLET | Freq: Three times a day (TID) | ORAL | 0 refills | Status: DC | PRN
  Filled 2022-02-09: qty 20, 7d supply, fill #0

## 2022-02-09 MED ORDER — ONDANSETRON 4 MG PO TBDP: 4.0000 mg | ORAL_TABLET | Freq: Three times a day (TID) | ORAL | 0 refills | Status: DC | PRN

## 2022-02-11 ENCOUNTER — Other Ambulatory Visit (HOSPITAL_COMMUNITY): Payer: Self-pay

## 2022-02-11 DIAGNOSIS — J309 Allergic rhinitis, unspecified: Secondary | ICD-10-CM | POA: Diagnosis not present

## 2022-02-11 MED ORDER — LEVOCETIRIZINE DIHYDROCHLORIDE 5 MG PO TABS
5.0000 mg | ORAL_TABLET | Freq: Every day | ORAL | 3 refills | Status: DC
  Filled 2022-08-28: qty 90, 90d supply, fill #0

## 2022-02-20 ENCOUNTER — Other Ambulatory Visit (HOSPITAL_COMMUNITY): Payer: Self-pay

## 2022-02-20 MED ORDER — METOPROLOL SUCCINATE ER 100 MG PO TB24
100.0000 mg | ORAL_TABLET | Freq: Every day | ORAL | 1 refills | Status: DC
  Filled 2022-02-20: qty 90, 90d supply, fill #0
  Filled 2022-05-20: qty 90, 90d supply, fill #1

## 2022-02-23 ENCOUNTER — Encounter (INDEPENDENT_AMBULATORY_CARE_PROVIDER_SITE_OTHER): Payer: Self-pay | Admitting: Family Medicine

## 2022-02-23 ENCOUNTER — Other Ambulatory Visit: Payer: Self-pay

## 2022-02-23 ENCOUNTER — Other Ambulatory Visit (HOSPITAL_COMMUNITY): Payer: Self-pay

## 2022-02-23 ENCOUNTER — Ambulatory Visit (INDEPENDENT_AMBULATORY_CARE_PROVIDER_SITE_OTHER): Payer: 59 | Admitting: Family Medicine

## 2022-02-23 VITALS — BP 166/71 | HR 75 | Temp 98.1°F | Ht 63.0 in | Wt 239.0 lb

## 2022-02-23 DIAGNOSIS — E65 Localized adiposity: Secondary | ICD-10-CM | POA: Diagnosis not present

## 2022-02-23 DIAGNOSIS — E669 Obesity, unspecified: Secondary | ICD-10-CM | POA: Diagnosis not present

## 2022-02-23 DIAGNOSIS — E1169 Type 2 diabetes mellitus with other specified complication: Secondary | ICD-10-CM | POA: Diagnosis not present

## 2022-02-23 DIAGNOSIS — Z6841 Body Mass Index (BMI) 40.0 and over, adult: Secondary | ICD-10-CM

## 2022-02-23 DIAGNOSIS — Z7984 Long term (current) use of oral hypoglycemic drugs: Secondary | ICD-10-CM | POA: Diagnosis not present

## 2022-02-23 DIAGNOSIS — M79672 Pain in left foot: Secondary | ICD-10-CM

## 2022-02-23 MED ORDER — MOUNJARO 2.5 MG/0.5ML ~~LOC~~ SOAJ
2.5000 mg | SUBCUTANEOUS | 0 refills | Status: DC
  Filled 2022-02-23: qty 6, 84d supply, fill #0

## 2022-02-25 ENCOUNTER — Ambulatory Visit: Payer: 59 | Admitting: Podiatry

## 2022-02-25 NOTE — Progress Notes (Signed)
Chief Complaint:   OBESITY [name] is here to discuss her progress with her obesity treatment plan along with follow-up of her obesity related diagnoses. See Medical Weight Management Flowsheet for complete bioelectrical impedance results.  Today's visit was #:  3 Starting weight: 243 lbs Starting date: 01/07/2022 Weight change since last visit: 2 lbs Total lbs lost to date: 4 lbs Total weight loss percentage to date: -1.65%  Nutrition Plan: Category 2 Plan for 50% of the time.  Activity: None. Anti-obesity medications: Mounjaro 5 mg subcutaneously weekly. Reported side effects: None.  Interim History: Jazsmine was on Mounjaro 2.5 mg subcutaneously weekly and experienced nausea and left-sided abdominal pain for 2 days.  She says that it just went away.  She has not had any Mounjaro for 2 weeks.  She says she has been eating more blackberries.  Assessment/Plan:   1. Type 2 diabetes mellitus with other specified complication, without long-term current use of insulin (HCC) Diabetes Mellitus: Not at goal. Medication: metformin XR 500 mg twice daily, glipizide 5 mg daily, Mounjaro 2.5 mg subcutaneously weekly (none for 2 weeks). Issues reviewed: blood sugar goals, complications of diabetes mellitus, hypoglycemia prevention and treatment, exercise, and nutrition.  Plan:  Continue medications as prescribed.  Will refill Mounjaro today, as per below.  The patient will continue to focus on protein-rich, low simple carbohydrate foods. We reviewed the importance of hydration, regular exercise for stress reduction, and restorative sleep.   Lab Results  Component Value Date   HGBA1C 6.9 (H) 06/30/2021   HGBA1C 7.0 (H) 06/20/2020   HGBA1C 7.6 09/02/2016   Lab Results  Component Value Date   LDLCALC 101 (H) 06/30/2021   CREATININE 0.60 01/07/2022   - Refill tirzepatide (MOUNJARO) 2.5 MG/0.5ML Pen; Inject 2.5 mg into the skin once a week.  Dispense: 6 mL; Refill: 0  2. Left foot  pain Jacia has Achilles tendonitis and plantar fasciitis.  She is followed by Dr. Paulla Dolly, and will see him on Wednesday.  3. Visceral obesity Current visceral fat rating: 17. Visceral fat rating goal is < 13. Visceral adipose tissue is a hormonally active component of total body fat. This body composition phenotype is associated with medical disorders such as metabolic syndrome, cardiovascular disease, and several malignancies including prostate, breast, and colorectal cancers. Starting goal: Lose 7-10% of starting weight.   4. Obesity, current BMI 42.4  Course: Barrie is currently in the action stage of change. As such, her goal is to continue with weight loss efforts.   Nutrition goals: She has agreed to the Category 2 Plan.   Exercise goals:  Increase NEAT.  Behavioral modification strategies: increasing lean protein intake, decreasing simple carbohydrates, increasing vegetables, and increasing water intake.  Kaziyah has agreed to follow-up with our clinic in 4 weeks. She was informed of the importance of frequent follow-up visits to maximize her success with intensive lifestyle modifications for her multiple health conditions.   Objective:   Blood pressure (!) 166/71, pulse 75, temperature 98.1 F (36.7 C), temperature source Oral, height '5\' 3"'$  (1.6 m), weight 239 lb (108.4 kg), SpO2 98 %. Body mass index is 42.34 kg/m.  General: Cooperative, alert, well developed, in no acute distress. HEENT: Conjunctivae and lids unremarkable. Cardiovascular: Regular rhythm.  Lungs: Normal work of breathing. Neurologic: No focal deficits.   Lab Results  Component Value Date   CREATININE 0.60 01/07/2022   BUN 10 01/07/2022   NA 140 01/07/2022   K 3.9 01/07/2022   CL 101  01/07/2022   CO2 22 01/07/2022   Lab Results  Component Value Date   ALT 11 01/07/2022   AST 14 01/07/2022   ALKPHOS 55 01/07/2022   BILITOT <0.2 01/07/2022   Lab Results  Component Value Date   HGBA1C 6.9 (H)  06/30/2021   HGBA1C 7.0 (H) 06/20/2020   HGBA1C 7.6 09/02/2016   HGBA1C 6.9 10/21/2015   HGBA1C 7.8 (H) 06/04/2015   Lab Results  Component Value Date   TSH 0.553 01/07/2022   Lab Results  Component Value Date   CHOL 197 06/30/2021   HDL 69 06/30/2021   LDLCALC 101 (H) 06/30/2021   TRIG 133 06/30/2021   CHOLHDL 2.9 06/30/2021   Lab Results  Component Value Date   VD25OH 20.6 (L) 01/07/2022   VD25OH 22.85 (L) 06/30/2021   VD25OH 12.2 (L) 06/20/2020   Lab Results  Component Value Date   WBC 7.8 01/07/2022   HGB 13.7 01/07/2022   HCT 38.7 01/07/2022   MCV 87 01/07/2022   PLT 316 01/07/2022   Lab Results  Component Value Date   IRON 59 06/20/2020   TIBC 429 06/20/2020   FERRITIN 74 06/20/2020   Attestation Statements:   Reviewed by clinician on day of visit: allergies, medications, problem list, medical history, surgical history, family history, social history, and previous encounter notes.  I, Water quality scientist, CMA, am acting as transcriptionist for Briscoe Deutscher, DO  I have reviewed the above documentation for accuracy and completeness, and I agree with the above. -  Briscoe Deutscher, DO, MS, FAAFP, DABOM - Family and Bariatric Medicine.

## 2022-02-27 DIAGNOSIS — H353131 Nonexudative age-related macular degeneration, bilateral, early dry stage: Secondary | ICD-10-CM | POA: Diagnosis not present

## 2022-03-01 ENCOUNTER — Other Ambulatory Visit (HOSPITAL_COMMUNITY): Payer: Self-pay

## 2022-03-02 ENCOUNTER — Other Ambulatory Visit (HOSPITAL_COMMUNITY): Payer: Self-pay

## 2022-03-02 MED ORDER — METFORMIN HCL ER 500 MG PO TB24
500.0000 mg | ORAL_TABLET | Freq: Two times a day (BID) | ORAL | 1 refills | Status: DC
Start: 2022-03-02 — End: 2023-09-30
  Filled 2022-03-02: qty 180, 90d supply, fill #0
  Filled 2022-12-02: qty 180, 90d supply, fill #1

## 2022-03-02 MED ORDER — ALPRAZOLAM 0.5 MG PO TABS
0.5000 mg | ORAL_TABLET | Freq: Two times a day (BID) | ORAL | 0 refills | Status: DC
  Filled 2022-03-02: qty 60, 30d supply, fill #0

## 2022-03-12 ENCOUNTER — Ambulatory Visit: Payer: 59 | Admitting: Podiatry

## 2022-03-12 DIAGNOSIS — M76822 Posterior tibial tendinitis, left leg: Secondary | ICD-10-CM | POA: Diagnosis not present

## 2022-03-12 DIAGNOSIS — T148XXA Other injury of unspecified body region, initial encounter: Secondary | ICD-10-CM

## 2022-03-12 MED ORDER — TRAMADOL HCL 50 MG PO TABS: 50.0000 mg | ORAL_TABLET | Freq: Three times a day (TID) | ORAL | 0 refills | Status: AC | PRN

## 2022-03-12 NOTE — Progress Notes (Signed)
Subjective:  ? ?Patient ID: [name], female   DOB: 61 y.o.   MRN: [id number]  ? ?HPI ?Patient states she continues to have severe discomfort in the medial side of the left ankle states its been very sore underneath the medial malleolus and making walking difficult despite boot usage ? ? ?ROS ? ? ?   ?Objective:  ?Physical Exam  ?Neurovascular status intact with continued intense discomfort around the posterior tibial tendon left as it comes underneath the medial malleolus ? ?   ?Assessment:  ?Possibility for interstitial tear of the posterior tibial tendon versus an inflammatory condition ? ?   ?Plan:  ?We did do diagnostic ultrasound today and I did not note tear but it was difficult to make complete determination and due to the intensity of her discomfort we are going to get an MRI to try to rule out other pathology associated with this ?   ? ? ?

## 2022-03-16 ENCOUNTER — Ambulatory Visit (INDEPENDENT_AMBULATORY_CARE_PROVIDER_SITE_OTHER): Payer: 59 | Admitting: Family Medicine

## 2022-03-16 ENCOUNTER — Encounter (INDEPENDENT_AMBULATORY_CARE_PROVIDER_SITE_OTHER): Payer: Self-pay | Admitting: Family Medicine

## 2022-03-16 VITALS — BP 171/72 | HR 75 | Temp 97.8°F | Ht 63.0 in | Wt 238.0 lb

## 2022-03-16 DIAGNOSIS — E66813 Obesity, class 3: Secondary | ICD-10-CM

## 2022-03-16 DIAGNOSIS — I10 Essential (primary) hypertension: Secondary | ICD-10-CM

## 2022-03-16 DIAGNOSIS — Z7984 Long term (current) use of oral hypoglycemic drugs: Secondary | ICD-10-CM

## 2022-03-16 DIAGNOSIS — E1169 Type 2 diabetes mellitus with other specified complication: Secondary | ICD-10-CM

## 2022-03-16 DIAGNOSIS — E669 Obesity, unspecified: Secondary | ICD-10-CM

## 2022-03-16 DIAGNOSIS — M25572 Pain in left ankle and joints of left foot: Secondary | ICD-10-CM

## 2022-03-16 DIAGNOSIS — Z6841 Body Mass Index (BMI) 40.0 and over, adult: Secondary | ICD-10-CM | POA: Diagnosis not present

## 2022-03-17 ENCOUNTER — Other Ambulatory Visit (HOSPITAL_COMMUNITY): Payer: Self-pay

## 2022-03-17 DIAGNOSIS — M1991 Primary osteoarthritis, unspecified site: Secondary | ICD-10-CM | POA: Diagnosis not present

## 2022-03-17 DIAGNOSIS — Z1331 Encounter for screening for depression: Secondary | ICD-10-CM | POA: Diagnosis not present

## 2022-03-17 DIAGNOSIS — Z6841 Body Mass Index (BMI) 40.0 and over, adult: Secondary | ICD-10-CM | POA: Diagnosis not present

## 2022-03-17 DIAGNOSIS — M47812 Spondylosis without myelopathy or radiculopathy, cervical region: Secondary | ICD-10-CM | POA: Diagnosis not present

## 2022-03-17 DIAGNOSIS — M5412 Radiculopathy, cervical region: Secondary | ICD-10-CM | POA: Diagnosis not present

## 2022-03-17 DIAGNOSIS — I10 Essential (primary) hypertension: Secondary | ICD-10-CM | POA: Diagnosis not present

## 2022-03-17 DIAGNOSIS — E119 Type 2 diabetes mellitus without complications: Secondary | ICD-10-CM | POA: Diagnosis not present

## 2022-03-17 MED ORDER — HYDROCHLOROTHIAZIDE 12.5 MG PO TABS
12.5000 mg | ORAL_TABLET | Freq: Every day | ORAL | 3 refills | Status: DC
Start: 2022-03-17 — End: 2023-09-30
  Filled 2022-03-17: qty 90, 90d supply, fill #0

## 2022-03-18 ENCOUNTER — Other Ambulatory Visit (HOSPITAL_COMMUNITY): Payer: Self-pay

## 2022-03-18 DIAGNOSIS — L720 Epidermal cyst: Secondary | ICD-10-CM | POA: Diagnosis not present

## 2022-03-18 DIAGNOSIS — L668 Other cicatricial alopecia: Secondary | ICD-10-CM | POA: Diagnosis not present

## 2022-03-18 DIAGNOSIS — L918 Other hypertrophic disorders of the skin: Secondary | ICD-10-CM | POA: Diagnosis not present

## 2022-03-18 MED ORDER — CLOBETASOL PROPIONATE 0.05 % EX SOLN
CUTANEOUS | 2 refills | Status: DC | PRN
  Filled 2022-03-18: qty 50, 30d supply, fill #0

## 2022-03-18 NOTE — Progress Notes (Signed)
Chief Complaint:   OBESITY [name] is here to discuss her progress with her obesity treatment plan along with follow-up of her obesity related diagnoses.   Today's visit was #: 4 Starting weight: 243 lbs Starting date: 01/07/2022 Today's weight: 238 lbs Today's date: 03/16/2022 Weight change since last visit: -1 lb Total lbs lost to date: 5 lbs Body mass index is 42.16 kg/m.  Total weight loss percentage to date: -2.06%  Current Meal Plan: the Category 2 Plan for 40% of the time.  Current Exercise Plan: None at this time. Current Anti-Obesity Medications: Mounjaro 2.5 mg subcutaneously weekly. Side effects: None.  Interim History: [name] has left ankle pain and is using tramadol PRN. She has a MRI scheduled. She has not taken Mounjaro since getting sick. She has 2.5 mg at home. She has bilateral lower extremity edema due to forgetting to take HCTZ.  Assessment/Plan:   1. Essential hypertension Not at goal. Medications: Norvasc 10 mg daily, HCTZ 12.5 mg daily, Cozaar 100 mg daily, metoprolol 100 mg daily.   Plan: Avoid buying foods that are: processed, frozen, or prepackaged to avoid excess salt. We will watch for signs of hypotension as she continues lifestyle modifications. Start taking HCTZ daily.  BP Readings from Last 3 Encounters:  03/16/22 (!) 171/72  02/23/22 (!) 166/71  01/28/22 (!) 167/78   Lab Results  Component Value Date   CREATININE 0.60 01/07/2022   2. Type 2 diabetes mellitus with other specified complication, without long-term current use of insulin (HCC) Diabetes Mellitus: Not at goal. Medication: metformin XR 500 mg twice daily, glipizide 5 mg daily, Mounjaro 2.5 mg subcutaneously weekly. Issues reviewed: blood sugar goals, complications of diabetes mellitus, hypoglycemia prevention and treatment, exercise, and nutrition.   Plan: The patient will continue to focus on protein-rich, low simple  carbohydrate foods. We reviewed the importance of hydration, regular exercise for stress reduction, and restorative sleep.   Lab Results  Component Value Date   HGBA1C 6.9 (H) 06/30/2021   HGBA1C 7.0 (H) 06/20/2020   HGBA1C 7.6 09/02/2016   Lab Results  Component Value Date   LDLCALC 101 (H) 06/30/2021   CREATININE 0.60 01/07/2022   3. Left ankle pain [name] has left ankle pain and is using tramadol PRN. She has a MRI scheduled. We will continue to monitor symptoms as they relate to her weight loss journey.  4. Obesity, current BMI 42.2 Course: [name] is currently in the action stage of change. As such, her goal is to continue with weight loss efforts.   Nutrition goals: She has agreed to the Category 2 Plan.   Exercise goals: As is.  Behavioral modification strategies: increasing lean protein intake, decreasing simple carbohydrates, increasing vegetables, increasing water intake, decreasing liquid calories, and decreasing sodium intake.  [name] has agreed to follow-up with our clinic in 6 weeks. She was informed of the importance of frequent follow-up visits to maximize her success with intensive lifestyle modifications for her multiple health conditions.   Objective:   Blood pressure (!) 171/72, pulse 75, temperature 97.8 F (36.6 C), temperature source Oral, height '5\' 3"'$  (1.6 m), weight 238 lb (108 kg), SpO2 95 %.  Body mass index is 42.16 kg/m.  General: Cooperative, alert, well developed, in no acute distress. HEENT: Conjunctivae and lids unremarkable. Cardiovascular: Regular rhythm.  Lungs: Normal work of breathing. Neurologic: No focal deficits.   Lab Results  Component Value Date   CREATININE 0.60 01/07/2022   BUN 10 01/07/2022   NA 140 01/07/2022   K 3.9 01/07/2022   CL 101 01/07/2022   CO2 22 01/07/2022   Lab Results  Component Value Date   ALT 11 01/07/2022   AST 14 01/07/2022   ALKPHOS 55 01/07/2022   BILITOT <0.2 01/07/2022   Lab Results  Component  Value Date   HGBA1C 6.9 (H) 06/30/2021   HGBA1C 7.0 (H) 06/20/2020   HGBA1C 7.6 09/02/2016   HGBA1C 6.9 10/21/2015   HGBA1C 7.8 (H) 06/04/2015   Lab Results  Component Value Date   TSH 0.553 01/07/2022   Lab Results  Component Value Date   CHOL 197 06/30/2021   HDL 69 06/30/2021   LDLCALC 101 (H) 06/30/2021   TRIG 133 06/30/2021   CHOLHDL 2.9 06/30/2021   Lab Results  Component Value Date   VD25OH 20.6 (L) 01/07/2022   VD25OH 22.85 (L) 06/30/2021   VD25OH 12.2 (L) 06/20/2020   Lab Results  Component Value Date   WBC 7.8 01/07/2022   HGB 13.7 01/07/2022   HCT 38.7 01/07/2022   MCV 87 01/07/2022   PLT 316 01/07/2022   Lab Results  Component Value Date   IRON 59 06/20/2020   TIBC 429 06/20/2020   FERRITIN 74 06/20/2020   Attestation Statements:   Reviewed by clinician on day of visit: allergies, medications, problem list, medical history, surgical history, family history, social history, and previous encounter notes.  Leodis Binet Friedenbach, CMA, am acting as Location manager for [employer], DO.  I have reviewed the above documentation for accuracy and completeness, and I agree with the above. -  Briscoe Deutscher, DO, MS, FAAFP, DABOM - Family and Bariatric Medicine.

## 2022-03-24 ENCOUNTER — Other Ambulatory Visit (HOSPITAL_COMMUNITY): Payer: Self-pay

## 2022-03-28 ENCOUNTER — Ambulatory Visit
Admission: RE | Admit: 2022-03-28 | Discharge: 2022-03-28 | Disposition: A | Discharge status: Home / Self Care | Payer: 59 | Source: Ambulatory Visit | Attending: Podiatry | Admitting: Podiatry

## 2022-03-28 DIAGNOSIS — T148XXA Other injury of unspecified body region, initial encounter: Secondary | ICD-10-CM

## 2022-03-28 DIAGNOSIS — R600 Localized edema: Secondary | ICD-10-CM | POA: Diagnosis not present

## 2022-03-31 ENCOUNTER — Telehealth: Payer: Self-pay | Admitting: *Deleted

## 2022-03-31 ENCOUNTER — Other Ambulatory Visit (HOSPITAL_COMMUNITY): Payer: Self-pay

## 2022-03-31 MED ORDER — ALPRAZOLAM 0.5 MG PO TABS
0.5000 mg | ORAL_TABLET | Freq: Two times a day (BID) | ORAL | 2 refills | Status: DC
  Filled 2022-03-31: qty 60, 30d supply, fill #0

## 2022-03-31 NOTE — Telephone Encounter (Signed)
Patient is calling for the MRI results(in epic). Please advise. ?

## 2022-04-01 NOTE — Telephone Encounter (Signed)
Patient is calling again for the MRI results (in epic) from 03/28/2022. Please contact. ?

## 2022-04-02 ENCOUNTER — Other Ambulatory Visit (HOSPITAL_COMMUNITY): Payer: Self-pay

## 2022-04-06 DIAGNOSIS — M170 Bilateral primary osteoarthritis of knee: Secondary | ICD-10-CM | POA: Diagnosis not present

## 2022-04-13 DIAGNOSIS — Z9189 Other specified personal risk factors, not elsewhere classified: Secondary | ICD-10-CM | POA: Diagnosis not present

## 2022-04-13 DIAGNOSIS — M25561 Pain in right knee: Secondary | ICD-10-CM | POA: Diagnosis not present

## 2022-04-13 DIAGNOSIS — E1159 Type 2 diabetes mellitus with other circulatory complications: Secondary | ICD-10-CM | POA: Diagnosis not present

## 2022-04-13 DIAGNOSIS — Z6841 Body Mass Index (BMI) 40.0 and over, adult: Secondary | ICD-10-CM | POA: Diagnosis not present

## 2022-04-13 DIAGNOSIS — I10 Essential (primary) hypertension: Secondary | ICD-10-CM | POA: Diagnosis not present

## 2022-04-13 DIAGNOSIS — M25562 Pain in left knee: Secondary | ICD-10-CM | POA: Diagnosis not present

## 2022-04-22 ENCOUNTER — Other Ambulatory Visit (HOSPITAL_COMMUNITY): Payer: Self-pay

## 2022-04-22 ENCOUNTER — Other Ambulatory Visit: Payer: Self-pay | Admitting: Internal Medicine

## 2022-04-22 DIAGNOSIS — Z1231 Encounter for screening mammogram for malignant neoplasm of breast: Secondary | ICD-10-CM

## 2022-04-23 ENCOUNTER — Other Ambulatory Visit (HOSPITAL_COMMUNITY): Payer: Self-pay

## 2022-04-23 DIAGNOSIS — M170 Bilateral primary osteoarthritis of knee: Secondary | ICD-10-CM | POA: Diagnosis not present

## 2022-04-30 DIAGNOSIS — M170 Bilateral primary osteoarthritis of knee: Secondary | ICD-10-CM | POA: Diagnosis not present

## 2022-05-05 ENCOUNTER — Other Ambulatory Visit (HOSPITAL_COMMUNITY): Payer: Self-pay

## 2022-05-07 DIAGNOSIS — M170 Bilateral primary osteoarthritis of knee: Secondary | ICD-10-CM | POA: Diagnosis not present

## 2022-05-12 ENCOUNTER — Other Ambulatory Visit (HOSPITAL_COMMUNITY): Payer: Self-pay

## 2022-05-18 ENCOUNTER — Other Ambulatory Visit (HOSPITAL_COMMUNITY): Payer: Self-pay

## 2022-05-18 DIAGNOSIS — K5792 Diverticulitis of intestine, part unspecified, without perforation or abscess without bleeding: Secondary | ICD-10-CM | POA: Diagnosis not present

## 2022-05-18 DIAGNOSIS — K3184 Gastroparesis: Secondary | ICD-10-CM | POA: Diagnosis not present

## 2022-05-18 DIAGNOSIS — K219 Gastro-esophageal reflux disease without esophagitis: Secondary | ICD-10-CM | POA: Diagnosis not present

## 2022-05-18 MED ORDER — METOCLOPRAMIDE HCL 5 MG PO TABS
5.0000 mg | ORAL_TABLET | Freq: Three times a day (TID) | ORAL | 0 refills | Status: DC
  Filled 2022-05-18: qty 90, 30d supply, fill #0

## 2022-05-20 ENCOUNTER — Other Ambulatory Visit (HOSPITAL_COMMUNITY): Payer: Self-pay

## 2022-05-25 ENCOUNTER — Ambulatory Visit
Admission: RE | Admit: 2022-05-25 | Discharge: 2022-05-25 | Disposition: A | Discharge status: Home / Self Care | Payer: 59 | Source: Ambulatory Visit | Attending: Internal Medicine | Admitting: Internal Medicine

## 2022-05-25 DIAGNOSIS — Z1231 Encounter for screening mammogram for malignant neoplasm of breast: Secondary | ICD-10-CM

## 2022-05-26 ENCOUNTER — Other Ambulatory Visit (HOSPITAL_COMMUNITY): Payer: Self-pay

## 2022-05-26 DIAGNOSIS — I10 Essential (primary) hypertension: Secondary | ICD-10-CM | POA: Diagnosis not present

## 2022-05-26 DIAGNOSIS — Z6841 Body Mass Index (BMI) 40.0 and over, adult: Secondary | ICD-10-CM | POA: Diagnosis not present

## 2022-05-26 DIAGNOSIS — M722 Plantar fascial fibromatosis: Secondary | ICD-10-CM | POA: Diagnosis not present

## 2022-05-26 DIAGNOSIS — M1991 Primary osteoarthritis, unspecified site: Secondary | ICD-10-CM | POA: Diagnosis not present

## 2022-05-26 DIAGNOSIS — E1165 Type 2 diabetes mellitus with hyperglycemia: Secondary | ICD-10-CM | POA: Diagnosis not present

## 2022-05-26 DIAGNOSIS — M47812 Spondylosis without myelopathy or radiculopathy, cervical region: Secondary | ICD-10-CM | POA: Diagnosis not present

## 2022-05-26 DIAGNOSIS — M5412 Radiculopathy, cervical region: Secondary | ICD-10-CM | POA: Diagnosis not present

## 2022-05-26 MED ORDER — LOSARTAN POTASSIUM 100 MG PO TABS
100.0000 mg | ORAL_TABLET | Freq: Every day | ORAL | 3 refills | Status: DC
  Filled 2022-05-26 – 2022-12-23 (×3): qty 90, 90d supply, fill #0

## 2022-05-26 MED ORDER — HYDROCHLOROTHIAZIDE 12.5 MG PO TABS
25.0000 mg | ORAL_TABLET | Freq: Every day | ORAL | 3 refills | Status: DC
  Filled 2022-05-26 (×2): qty 180, 90d supply, fill #0
  Filled 2022-08-28: qty 180, 90d supply, fill #1
  Filled 2022-12-02: qty 180, 90d supply, fill #2
  Filled 2023-03-29: qty 180, 90d supply, fill #3

## 2022-05-26 MED ORDER — AMLODIPINE BESYLATE 10 MG PO TABS
10.0000 mg | ORAL_TABLET | Freq: Every day | ORAL | 3 refills | Status: DC
  Filled 2022-05-26 – 2022-08-05 (×2): qty 90, 90d supply, fill #0
  Filled 2022-11-03: qty 90, 90d supply, fill #1
  Filled 2023-01-29: qty 90, 90d supply, fill #2
  Filled 2023-02-02: qty 90, 90d supply, fill #0
  Filled 2023-05-06: qty 90, 90d supply, fill #1

## 2022-05-26 MED ORDER — METFORMIN HCL ER 500 MG PO TB24
500.0000 mg | ORAL_TABLET | Freq: Two times a day (BID) | ORAL | 3 refills | Status: DC
  Filled 2022-05-26: qty 180, 90d supply, fill #0
  Filled 2022-08-28: qty 180, 90d supply, fill #1
  Filled 2023-02-25: qty 180, 90d supply, fill #2
  Filled 2023-05-26: qty 180, 90d supply, fill #3

## 2022-05-26 MED ORDER — JANUVIA 100 MG PO TABS
100.0000 mg | ORAL_TABLET | Freq: Every day | ORAL | 3 refills | Status: DC
  Filled 2022-05-26: qty 30, 30d supply, fill #0
  Filled 2022-07-19: qty 30, 30d supply, fill #1
  Filled 2022-08-28: qty 30, 30d supply, fill #2
  Filled 2022-10-17: qty 30, 30d supply, fill #3
  Filled 2022-11-11 – 2022-12-02 (×2): qty 30, 30d supply, fill #4
  Filled 2023-01-11: qty 30, 30d supply, fill #5
  Filled 2023-02-09: qty 30, 30d supply, fill #6
  Filled 2023-03-08: qty 30, 30d supply, fill #0
  Filled 2023-04-06: qty 30, 30d supply, fill #1
  Filled 2023-05-06: qty 30, 30d supply, fill #2

## 2022-05-26 MED ORDER — METOPROLOL SUCCINATE ER 100 MG PO TB24
100.0000 mg | ORAL_TABLET | Freq: Every day | ORAL | 3 refills | Status: DC
  Filled 2022-08-23: qty 90, 90d supply, fill #0
  Filled 2022-11-17: qty 90, 90d supply, fill #1
  Filled 2023-02-09: qty 90, 90d supply, fill #2
  Filled 2023-05-26: qty 90, 90d supply, fill #3

## 2022-05-26 MED ORDER — ALPRAZOLAM 0.5 MG PO TABS
0.5000 mg | ORAL_TABLET | Freq: Two times a day (BID) | ORAL | 0 refills | Status: DC
  Filled 2022-05-26: qty 180, 90d supply, fill #0

## 2022-05-26 MED ORDER — GLIPIZIDE 5 MG PO TABS
5.0000 mg | ORAL_TABLET | Freq: Every day | ORAL | 3 refills | Status: DC
  Filled 2022-05-26 – 2022-06-22 (×2): qty 90, 90d supply, fill #0
  Filled 2022-09-23: qty 90, 90d supply, fill #1
  Filled 2023-01-11: qty 90, 90d supply, fill #2
  Filled 2023-05-26: qty 90, 90d supply, fill #3

## 2022-05-27 ENCOUNTER — Telehealth: Payer: Self-pay | Admitting: *Deleted

## 2022-05-28 NOTE — Telephone Encounter (Signed)
Patient has received results of her MRI, still having problems with foot. Explained to schedule a f/u to re evaluate,will call back when she has her calendar available.

## 2022-06-01 ENCOUNTER — Other Ambulatory Visit (HOSPITAL_COMMUNITY): Payer: Self-pay

## 2022-06-05 ENCOUNTER — Ambulatory Visit: Payer: 59 | Admitting: Podiatry

## 2022-06-11 DIAGNOSIS — M47816 Spondylosis without myelopathy or radiculopathy, lumbar region: Secondary | ICD-10-CM | POA: Diagnosis not present

## 2022-06-11 DIAGNOSIS — M5416 Radiculopathy, lumbar region: Secondary | ICD-10-CM | POA: Diagnosis not present

## 2022-06-11 DIAGNOSIS — G894 Chronic pain syndrome: Secondary | ICD-10-CM | POA: Diagnosis not present

## 2022-06-11 DIAGNOSIS — E1165 Type 2 diabetes mellitus with hyperglycemia: Secondary | ICD-10-CM | POA: Diagnosis not present

## 2022-06-11 DIAGNOSIS — M5412 Radiculopathy, cervical region: Secondary | ICD-10-CM | POA: Diagnosis not present

## 2022-06-11 DIAGNOSIS — M47812 Spondylosis without myelopathy or radiculopathy, cervical region: Secondary | ICD-10-CM | POA: Diagnosis not present

## 2022-06-11 DIAGNOSIS — I10 Essential (primary) hypertension: Secondary | ICD-10-CM | POA: Diagnosis not present

## 2022-06-11 DIAGNOSIS — Z6841 Body Mass Index (BMI) 40.0 and over, adult: Secondary | ICD-10-CM | POA: Diagnosis not present

## 2022-06-11 DIAGNOSIS — M1991 Primary osteoarthritis, unspecified site: Secondary | ICD-10-CM | POA: Diagnosis not present

## 2022-06-12 ENCOUNTER — Ambulatory Visit: Payer: 59 | Admitting: Podiatry

## 2022-06-12 ENCOUNTER — Encounter: Payer: Self-pay | Admitting: Podiatry

## 2022-06-12 DIAGNOSIS — M76822 Posterior tibial tendinitis, left leg: Secondary | ICD-10-CM

## 2022-06-12 DIAGNOSIS — M7751 Other enthesopathy of right foot: Secondary | ICD-10-CM | POA: Diagnosis not present

## 2022-06-12 DIAGNOSIS — M7752 Other enthesopathy of left foot: Secondary | ICD-10-CM

## 2022-06-12 MED ORDER — TRIAMCINOLONE ACETONIDE 10 MG/ML IJ SUSP
20.0000 mg | Freq: Once | INTRAMUSCULAR | Status: AC
  Administered 2022-06-12: 20 mg

## 2022-06-14 NOTE — Progress Notes (Signed)
Subjective:   Patient ID: [name], female   DOB: 61 y.o.   MRN: [id number]   HPI Patient states she still has a lot of pain when she stands on her foot and also is hurting on the outside besides on the inside.  She does have flatfeet she knows that part of the problem neuro   ROS      Objective:  Physical Exam  Vascular status intact with continued discomfort posterior tibial tendon left near its insertion navicular with flatfoot deformity noted bilateral and quite a bit of discomfort into the sinus tarsi left with inflammation fluid in the sinus tarsi     Assessment:  Inflammatory posterior tibial tendinitis left along with sinus tarsitis left with MRI indicating no current tear of the tendon     Plan:  H&P reviewed condition MRI with the patient.  At this point I am going to try medial sheath injection along with a sinus tarsi capsular injection along with orthotics to hold up the arch.  She will use boot for the next period of time until we can get the orthotics back and she was casted for functional orthotics today and today sterile prep and injected the medial tendon 3 mg Dexasone Kenalog 5 mg Xylocaine in the sinus tarsi 3 mg Kenalog 5 mg Xylocaine advised her to watch her sugar more carefully over the next few days and use her brace her boot and supportive shoes

## 2022-06-15 DIAGNOSIS — G4726 Circadian rhythm sleep disorder, shift work type: Secondary | ICD-10-CM | POA: Diagnosis not present

## 2022-06-15 DIAGNOSIS — E118 Type 2 diabetes mellitus with unspecified complications: Secondary | ICD-10-CM | POA: Diagnosis not present

## 2022-06-15 DIAGNOSIS — M25569 Pain in unspecified knee: Secondary | ICD-10-CM | POA: Diagnosis not present

## 2022-06-22 ENCOUNTER — Other Ambulatory Visit (HOSPITAL_COMMUNITY): Payer: Self-pay

## 2022-07-14 ENCOUNTER — Ambulatory Visit: Payer: 59

## 2022-07-14 ENCOUNTER — Ambulatory Visit (INDEPENDENT_AMBULATORY_CARE_PROVIDER_SITE_OTHER): Payer: 59

## 2022-07-14 ENCOUNTER — Ambulatory Visit: Payer: 59 | Admitting: Podiatry

## 2022-07-14 DIAGNOSIS — Q742 Other congenital malformations of lower limb(s), including pelvic girdle: Secondary | ICD-10-CM | POA: Diagnosis not present

## 2022-07-14 DIAGNOSIS — M79672 Pain in left foot: Secondary | ICD-10-CM | POA: Diagnosis not present

## 2022-07-14 DIAGNOSIS — M76822 Posterior tibial tendinitis, left leg: Secondary | ICD-10-CM | POA: Diagnosis not present

## 2022-07-14 NOTE — Patient Instructions (Signed)
Posterior Tibial Tendinitis  Posterior tibial tendinitis is irritation of a tendon called the posterior tibial tendon. Your posterior tibial tendon is a cord-like tissue that connects bones of your lower leg and foot to a muscle that: Supports your arch. Helps you raise up on your toes. Helps you turn your foot down and in. This condition causes foot and ankle pain. It can also lead to a flat foot. What are the causes? This condition is most often caused by repeated stress to the tendon (overuse injury). It can also be caused by a sudden injury that stresses the tendon, such as landing on your foot after jumping or falling. What increases the risk? This condition is more likely to develop in: People who play a sport that involves putting a lot of pressure on the feet, such as: Basketball. Tennis. Soccer. Hockey. Runners. Females who are older than 63 years of age and are overweight. People with diabetes. People with decreased foot stability. People with flat feet. What are the signs or symptoms? Symptoms include: Pain in the inner ankle. Pain at the arch of your foot. Pain that gets worse with running, walking, or standing. Swelling on the inside of your ankle and foot. Weakness in your ankle or foot. Inability to stand up on tiptoe. Flattening of the arch of your foot. How is this diagnosed? This condition may be diagnosed based on: Your symptoms. Your medical history. A physical exam. Tests, such as: X-ray. MRI. Ultrasound. How is this treated? This condition may be treated by: Putting ice to the injured area. Taking NSAIDs, such as ibuprofen, to reduce pain and swelling. Wearing a special shoe or shoe insert to support your arch (orthotic). Having physical therapy. Replacing high-impact exercise with low-impact exercise, such as swimming or cycling. If your symptoms do not improve with these treatments, you may need to wear a splint, removable walking boot, or short  leg cast for 6-8 weeks to keep your foot and ankle still (immobilized). Follow these instructions at home: If you have a cast, splint, or boot: Keep it clean and dry. Check the skin around it every day. Tell your health care provider about any concerns. If you have a cast: Do not stick anything inside it to scratch your skin. Doing that increases your risk of infection. You may put lotion on dry skin around the edges of the cast. Do not put lotion on the skin underneath the cast. If you have a splint or boot: Wear it as told by your health care provider. Remove it only as told by your health care provider. Loosen it if your toes tingle, become numb, or turn cold and blue. Bathing Do not take baths, swim, or use a hot tub until your health care provider approves. Ask your health care provider if you may take showers. If your cast, splint, or boot is not waterproof: Do not let it get wet. Cover it with a waterproof covering while you take a bath or a shower. Managing pain and swelling   If directed, put ice on the injured area. If you have a removable splint or boot, remove it as told by your health care provider. Put ice in a plastic bag. Place a towel between your skin and the bag or between your cast and the bag. Leave the ice on for 20 minutes, 2-3 times a day. Move your toes often to reduce stiffness and swelling. Raise (elevate) the injured area above the level of your heart while you are sitting  or lying down. Activity Do not use the injured foot to support your body weight until your health care provider says that you can. Use crutches as told by your health care provider. Do not do activities that make pain or swelling worse. Ask your health care provider when it is safe to drive if you have a cast, splint, or boot on your foot. Return to your normal activities as told by your health care provider. Ask your health care provider what activities are safe for you. Do exercises as  told by your health care provider. General instructions Take over-the-counter and prescription medicines only as told by your health care provider. If you have an orthotic, use it as told by your health care provider. Keep all follow-up visits as told by your health care provider. This is important. How is this prevented? Wear footwear that is appropriate to your athletic activity. Avoid athletic activities that cause pain or swelling in your ankle or foot. Before being active, do range-of-motion and stretching exercises. If you develop pain or swelling while training, stop training. If you have pain or swelling that does not improve after a few days of rest, see your health care provider. If you start a new athletic activity, start gradually so you can build up your strength and flexibility. Contact a health care provider if: Your symptoms get worse. Your symptoms do not improve in 6-8 weeks. You develop new, unexplained symptoms. Your splint, boot, or cast gets damaged. Summary Posterior tibial tendinitis is irritation of a tendon called the posterior tibial tendon. This condition is most often caused by repeated stress to the tendon (overuse injury). This condition causes foot pain and ankle pain. It can also lead to a flat foot. This condition may be treated by not doing high-impact activities, applying ice, having physical therapy, wearing orthotics, and wearing a cast, splint, or boot if needed. This information is not intended to replace advice given to you by your health care provider. Make sure you discuss any questions you have with your health care provider. Document Revised: 03/28/2019 Document Reviewed: 02/02/2019 Elsevier Patient Education  [place].  Posterior Tibial Tendinitis Rehab Ask your health care provider which exercises are safe for you. Do exercises exactly as told by your health care provider and adjust them as directed. It is normal to feel mild  stretching, pulling, tightness, or discomfort as you do these exercises. Stop right away if you feel sudden pain or your pain gets worse. Do not begin these exercises until told by your health care provider. Stretching and range-of-motion exercises These exercises warm up your muscles and joints and improve the movement and flexibility in your ankle and foot. These exercises may also help to relieve pain. Standing wall calf stretch, knee straight   Stand with your hands against a wall. Extend your left / right leg behind you, and bend your front knee slightly. If directed, place a folded washcloth under the arch of your foot for support. Point the toes of your back foot slightly inward. Keeping your heels on the floor and your back knee straight, shift your weight toward the wall. Do not allow your back to arch. You should feel a gentle stretch in your upper left / right calf. Hold this position for 10 seconds. Repeat 10 times. Complete this exercise 2 times a day. Standing wall calf stretch, knee bent Stand with your hands against a wall. Extend your left / right leg behind you, and bend your front  knee slightly. If directed, place a folded washcloth under the arch of your foot for support. Point the toes of your back foot slightly inward. Unlock your back knee so it is bent. Keep your heels on the floor. You should feel a gentle stretch deep in your lower left / right calf. Hold this position for 10 seconds. Repeat 10 times. Complete this exercise 2 times a day. Strengthening exercises These exercises build strength and endurance in your ankle and foot. Endurance is the ability to use your muscles for a long time, even after they get tired. Ankle inversion with band Secure one end of a rubber exercise band or tubing to a fixed object, such as a table leg or a pole, that will stay still when the band is pulled. Loop the other end of the band around the middle of your left / right foot. Sit  on the floor facing the object with your left / right leg extended. The band or tube should be slightly tense when your foot is relaxed. Leading with your big toe, slowly bring your left / right foot and ankle inward, toward your other foot (inversion). Hold this position for 10 seconds. Slowly return your foot to the starting position. Repeat 10 times. Complete this exercise 2 times a day. Towel curls   Sit in a chair on a non-carpeted surface, and put your feet on the floor. Place a towel in front of your feet. Keeping your heel on the floor, put your left / right foot on the towel. Pull the towel toward you by grabbing the towel with your toes and curling them under. Keep your heel on the floor while you do this. Let your toes relax. Grab the towel with your toes again. Keep going until the towel is completely underneath your foot. Repeat 10 times. Complete this exercise 2 times a day. Balance exercise This exercise improves or maintains your balance. Balance is important in preventing falls. Single leg stand Without wearing shoes, stand near a railing or in a doorway. You may hold on to the railing or door frame as needed for balance. Stand on your left / right foot. Keep your big toe down on the floor and try to keep your arch lifted. If balancing in this position is too easy, try the exercise with your eyes closed or while standing on a pillow. Hold this position for 10 seconds. Repeat 10 times. Complete this exercise 2 times a day. This information is not intended to replace advice given to you by your health care provider. Make sure you discuss any questions you have with your health care provider.

## 2022-07-15 DIAGNOSIS — E1165 Type 2 diabetes mellitus with hyperglycemia: Secondary | ICD-10-CM | POA: Diagnosis not present

## 2022-07-15 DIAGNOSIS — J329 Chronic sinusitis, unspecified: Secondary | ICD-10-CM | POA: Diagnosis not present

## 2022-07-15 DIAGNOSIS — Z6841 Body Mass Index (BMI) 40.0 and over, adult: Secondary | ICD-10-CM | POA: Diagnosis not present

## 2022-07-15 DIAGNOSIS — I10 Essential (primary) hypertension: Secondary | ICD-10-CM | POA: Diagnosis not present

## 2022-07-15 DIAGNOSIS — G894 Chronic pain syndrome: Secondary | ICD-10-CM | POA: Diagnosis not present

## 2022-07-17 DIAGNOSIS — R7989 Other specified abnormal findings of blood chemistry: Secondary | ICD-10-CM | POA: Diagnosis not present

## 2022-07-19 ENCOUNTER — Encounter: Payer: Self-pay | Admitting: Podiatry

## 2022-07-19 ENCOUNTER — Other Ambulatory Visit (HOSPITAL_COMMUNITY): Payer: Self-pay

## 2022-07-19 NOTE — Progress Notes (Signed)
  Subjective:  Patient ID: [name], female    DOB: 18-Apr-1961,  MRN: [id number]  Chief Complaint  Patient presents with   Tendonitis     left foot pain and swelling/second opinion/ req Dr Sherryle Lis. Nothing has helped her pain. She is hoping we can explain/ find the cause of her pain by reviewing the MRI     61 y.o. female presents with the above complaint. History confirmed with patient.  Still very painful for her.  She has had a boot and injections and still is not improving.  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 Pain associated with accessory navicular bone of foot, left   2. Posterior tibial tendinitis, left      Plan:  Patient was evaluated and treated and all questions answered.  Reviewed today's radiographs and discussed her clinical course and treatment.  I recommended physical therapy as well and a referral was sent for this.  We discussed nonsurgical and surgical treatment and so far she has exhausted most of her nonsurgical treatment options.  We discussed a Kidner type procedure.  I do recommend an ultrasound prior to proceeding with this and a referral will be sent to [facility] to evaluate for any tearing within the tendon prior to any surgical intervention.  I will see her back in about a month for this  Return in about 1 month (around 08/14/2022) for recheck PT tendonitis after PT, after ultrasound to review.

## 2022-07-20 ENCOUNTER — Other Ambulatory Visit (HOSPITAL_COMMUNITY): Payer: Self-pay

## 2022-07-20 DIAGNOSIS — Z6841 Body Mass Index (BMI) 40.0 and over, adult: Secondary | ICD-10-CM | POA: Diagnosis not present

## 2022-07-20 DIAGNOSIS — E118 Type 2 diabetes mellitus with unspecified complications: Secondary | ICD-10-CM | POA: Diagnosis not present

## 2022-07-20 DIAGNOSIS — E65 Localized adiposity: Secondary | ICD-10-CM | POA: Diagnosis not present

## 2022-07-20 DIAGNOSIS — G4720 Circadian rhythm sleep disorder, unspecified type: Secondary | ICD-10-CM | POA: Diagnosis not present

## 2022-07-20 DIAGNOSIS — K3184 Gastroparesis: Secondary | ICD-10-CM | POA: Diagnosis not present

## 2022-07-20 DIAGNOSIS — G8929 Other chronic pain: Secondary | ICD-10-CM | POA: Diagnosis not present

## 2022-07-20 MED ORDER — ALPRAZOLAM 0.5 MG PO TABS
0.5000 mg | ORAL_TABLET | Freq: Two times a day (BID) | ORAL | 0 refills | Status: DC
  Filled 2022-07-20 – 2022-08-28 (×2): qty 180, 90d supply, fill #0

## 2022-07-21 ENCOUNTER — Other Ambulatory Visit (HOSPITAL_COMMUNITY): Payer: Self-pay

## 2022-07-21 MED ORDER — ZOLPIDEM TARTRATE 10 MG PO TABS
10.0000 mg | ORAL_TABLET | Freq: Every evening | ORAL | 4 refills | Status: AC | PRN
  Filled 2022-07-21: qty 30, 30d supply, fill #0

## 2022-07-21 MED ORDER — TRAZODONE HCL 50 MG PO TABS
25.0000 mg | ORAL_TABLET | Freq: Every evening | ORAL | 0 refills | Status: DC | PRN
  Filled 2022-07-21: qty 30, 30d supply, fill #0

## 2022-07-22 ENCOUNTER — Encounter (INDEPENDENT_AMBULATORY_CARE_PROVIDER_SITE_OTHER): Payer: Self-pay

## 2022-07-23 ENCOUNTER — Ambulatory Visit: Payer: 59 | Admitting: Podiatry

## 2022-07-23 ENCOUNTER — Other Ambulatory Visit (HOSPITAL_COMMUNITY): Payer: Self-pay

## 2022-07-24 ENCOUNTER — Other Ambulatory Visit (HOSPITAL_COMMUNITY): Payer: Self-pay

## 2022-07-27 ENCOUNTER — Other Ambulatory Visit (HOSPITAL_COMMUNITY): Payer: Self-pay

## 2022-08-05 ENCOUNTER — Other Ambulatory Visit (HOSPITAL_COMMUNITY): Payer: Self-pay

## 2022-08-18 ENCOUNTER — Other Ambulatory Visit (HOSPITAL_COMMUNITY): Payer: Self-pay

## 2022-08-24 ENCOUNTER — Other Ambulatory Visit (HOSPITAL_COMMUNITY): Payer: Self-pay

## 2022-08-28 ENCOUNTER — Other Ambulatory Visit (HOSPITAL_COMMUNITY): Payer: Self-pay

## 2022-08-28 MED ORDER — METOCLOPRAMIDE HCL 5 MG PO TABS
5.0000 mg | ORAL_TABLET | Freq: Three times a day (TID) | ORAL | 0 refills | Status: DC
  Filled 2022-08-28: qty 90, 30d supply, fill #0

## 2022-09-04 DIAGNOSIS — H5213 Myopia, bilateral: Secondary | ICD-10-CM | POA: Diagnosis not present

## 2022-09-07 ENCOUNTER — Other Ambulatory Visit (HOSPITAL_COMMUNITY): Payer: Self-pay

## 2022-09-08 ENCOUNTER — Other Ambulatory Visit (HOSPITAL_COMMUNITY): Payer: Self-pay

## 2022-09-08 MED ORDER — PANTOPRAZOLE SODIUM 40 MG PO TBEC
40.0000 mg | DELAYED_RELEASE_TABLET | Freq: Every day | ORAL | 3 refills | Status: DC
  Filled 2022-09-08: qty 90, 90d supply, fill #0
  Filled 2022-12-02: qty 90, 90d supply, fill #1
  Filled 2022-12-23 – 2023-03-02 (×2): qty 90, 90d supply, fill #2
  Filled 2023-05-31: qty 90, 90d supply, fill #3

## 2022-09-17 ENCOUNTER — Other Ambulatory Visit (HOSPITAL_COMMUNITY): Payer: Self-pay

## 2022-09-20 ENCOUNTER — Other Ambulatory Visit (HOSPITAL_COMMUNITY): Payer: Self-pay

## 2022-09-21 ENCOUNTER — Other Ambulatory Visit (HOSPITAL_COMMUNITY): Payer: Self-pay

## 2022-09-21 DIAGNOSIS — M79644 Pain in right finger(s): Secondary | ICD-10-CM | POA: Diagnosis not present

## 2022-09-21 MED ORDER — LOSARTAN POTASSIUM 100 MG PO TABS
100.0000 mg | ORAL_TABLET | Freq: Every day | ORAL | 1 refills | Status: DC
  Filled 2022-09-21: qty 90, 90d supply, fill #0

## 2022-09-24 ENCOUNTER — Other Ambulatory Visit (HOSPITAL_COMMUNITY): Payer: Self-pay

## 2022-09-24 ENCOUNTER — Ambulatory Visit: Payer: Self-pay | Admitting: Physician Assistant

## 2022-10-01 ENCOUNTER — Other Ambulatory Visit (HOSPITAL_COMMUNITY): Payer: Self-pay

## 2022-10-02 ENCOUNTER — Other Ambulatory Visit (HOSPITAL_COMMUNITY): Payer: Self-pay

## 2022-10-07 ENCOUNTER — Other Ambulatory Visit (HOSPITAL_COMMUNITY): Payer: Self-pay

## 2022-10-08 ENCOUNTER — Ambulatory Visit (HOSPITAL_BASED_OUTPATIENT_CLINIC_OR_DEPARTMENT_OTHER): Admit: 2022-10-08 | Payer: 59 | Admitting: Orthopedic Surgery

## 2022-10-08 ENCOUNTER — Encounter (HOSPITAL_BASED_OUTPATIENT_CLINIC_OR_DEPARTMENT_OTHER): Payer: Self-pay

## 2022-10-08 SURGERY — RELEASE, A1 PULLEY, FOR TRIGGER FINGER
Anesthesia: Choice | Laterality: Right

## 2022-10-13 ENCOUNTER — Other Ambulatory Visit (HOSPITAL_COMMUNITY): Payer: Self-pay

## 2022-10-16 ENCOUNTER — Other Ambulatory Visit (HOSPITAL_COMMUNITY): Payer: Self-pay

## 2022-10-17 ENCOUNTER — Other Ambulatory Visit (HOSPITAL_COMMUNITY): Payer: Self-pay

## 2022-10-17 MED ORDER — OLMESARTAN MEDOXOMIL 20 MG PO TABS
20.0000 mg | ORAL_TABLET | Freq: Every day | ORAL | 0 refills | Status: DC
  Filled 2022-10-17: qty 60, 60d supply, fill #0

## 2022-10-19 ENCOUNTER — Other Ambulatory Visit (HOSPITAL_COMMUNITY): Payer: Self-pay

## 2022-10-19 MED ORDER — ALPRAZOLAM 0.5 MG PO TABS
0.5000 mg | ORAL_TABLET | Freq: Two times a day (BID) | ORAL | 0 refills | Status: DC
  Filled 2022-10-19 – 2022-12-02 (×2): qty 180, 90d supply, fill #0

## 2022-10-23 ENCOUNTER — Other Ambulatory Visit (HOSPITAL_COMMUNITY): Payer: Self-pay

## 2022-10-26 DIAGNOSIS — H353132 Nonexudative age-related macular degeneration, bilateral, intermediate dry stage: Secondary | ICD-10-CM | POA: Diagnosis not present

## 2022-10-26 DIAGNOSIS — H25811 Combined forms of age-related cataract, right eye: Secondary | ICD-10-CM | POA: Diagnosis not present

## 2022-11-03 ENCOUNTER — Other Ambulatory Visit (HOSPITAL_COMMUNITY): Payer: Self-pay

## 2022-11-09 ENCOUNTER — Other Ambulatory Visit (HOSPITAL_COMMUNITY): Payer: Self-pay

## 2022-11-09 MED ORDER — OFLOXACIN 0.3 % OP SOLN
1.0000 [drp] | Freq: Four times a day (QID) | OPHTHALMIC | 1 refills | Status: DC
  Filled 2022-11-09: qty 5, 25d supply, fill #0

## 2022-11-09 MED ORDER — PREDNISOLONE ACETATE 1 % OP SUSP
OPHTHALMIC | 0 refills | Status: AC
  Filled 2022-11-09: qty 5, 21d supply, fill #0

## 2022-11-10 ENCOUNTER — Other Ambulatory Visit (HOSPITAL_COMMUNITY): Payer: Self-pay

## 2022-11-11 ENCOUNTER — Other Ambulatory Visit (HOSPITAL_COMMUNITY): Payer: Self-pay

## 2022-11-11 DIAGNOSIS — I10 Essential (primary) hypertension: Secondary | ICD-10-CM | POA: Diagnosis not present

## 2022-11-11 DIAGNOSIS — M47816 Spondylosis without myelopathy or radiculopathy, lumbar region: Secondary | ICD-10-CM | POA: Diagnosis not present

## 2022-11-11 DIAGNOSIS — M47812 Spondylosis without myelopathy or radiculopathy, cervical region: Secondary | ICD-10-CM | POA: Diagnosis not present

## 2022-11-11 DIAGNOSIS — G894 Chronic pain syndrome: Secondary | ICD-10-CM | POA: Diagnosis not present

## 2022-11-11 DIAGNOSIS — M1991 Primary osteoarthritis, unspecified site: Secondary | ICD-10-CM | POA: Diagnosis not present

## 2022-11-11 DIAGNOSIS — J309 Allergic rhinitis, unspecified: Secondary | ICD-10-CM | POA: Diagnosis not present

## 2022-11-11 DIAGNOSIS — M5416 Radiculopathy, lumbar region: Secondary | ICD-10-CM | POA: Diagnosis not present

## 2022-11-11 DIAGNOSIS — E1165 Type 2 diabetes mellitus with hyperglycemia: Secondary | ICD-10-CM | POA: Diagnosis not present

## 2022-11-11 DIAGNOSIS — Z6841 Body Mass Index (BMI) 40.0 and over, adult: Secondary | ICD-10-CM | POA: Diagnosis not present

## 2022-11-11 DIAGNOSIS — E6609 Other obesity due to excess calories: Secondary | ICD-10-CM | POA: Diagnosis not present

## 2022-11-11 DIAGNOSIS — M5412 Radiculopathy, cervical region: Secondary | ICD-10-CM | POA: Diagnosis not present

## 2022-11-12 ENCOUNTER — Other Ambulatory Visit (HOSPITAL_COMMUNITY): Payer: Self-pay

## 2022-11-12 MED ORDER — METHYLPREDNISOLONE 4 MG PO TBPK
ORAL_TABLET | ORAL | 0 refills | Status: DC
  Filled 2022-11-12: qty 1, 6d supply, fill #0

## 2022-11-12 MED ORDER — GLIPIZIDE 10 MG PO TABS
10.0000 mg | ORAL_TABLET | Freq: Every day | ORAL | 11 refills | Status: DC
  Filled 2022-11-12: qty 30, 30d supply, fill #0
  Filled 2022-12-23 (×2): qty 30, 30d supply, fill #1
  Filled 2023-02-09: qty 30, 30d supply, fill #2
  Filled 2023-03-08: qty 30, 30d supply, fill #0
  Filled 2023-05-06: qty 30, 30d supply, fill #1
  Filled 2023-05-31: qty 30, 30d supply, fill #2
  Filled 2023-06-30: qty 30, 30d supply, fill #3
  Filled 2023-08-30: qty 30, 30d supply, fill #4
  Filled 2023-09-25: qty 30, 30d supply, fill #5
  Filled 2023-10-25: qty 30, 30d supply, fill #6

## 2022-11-12 MED ORDER — PREMARIN 0.625 MG PO TABS
0.6250 mg | ORAL_TABLET | Freq: Every day | ORAL | 1 refills | Status: DC
  Filled 2022-11-12: qty 30, 30d supply, fill #0
  Filled 2022-12-15: qty 30, 30d supply, fill #1

## 2022-11-12 MED ORDER — OLMESARTAN MEDOXOMIL 40 MG PO TABS
40.0000 mg | ORAL_TABLET | Freq: Every day | ORAL | 11 refills | Status: DC
  Filled 2022-11-12: qty 30, 30d supply, fill #0
  Filled 2022-12-23 (×2): qty 30, 30d supply, fill #1
  Filled 2023-01-29: qty 30, 30d supply, fill #2
  Filled 2023-02-02: qty 30, 30d supply, fill #0
  Filled 2023-02-25: qty 30, 30d supply, fill #1
  Filled 2023-03-29: qty 30, 30d supply, fill #2
  Filled 2023-05-06: qty 30, 30d supply, fill #3
  Filled 2023-05-31: qty 30, 30d supply, fill #4
  Filled 2023-06-30: qty 30, 30d supply, fill #5
  Filled 2023-08-03: qty 30, 30d supply, fill #6
  Filled 2023-08-30: qty 30, 30d supply, fill #7
  Filled 2023-09-26 – 2023-10-07 (×2): qty 30, 30d supply, fill #8
  Filled 2023-11-01: qty 30, 30d supply, fill #9

## 2022-11-12 MED ORDER — MONTELUKAST SODIUM 10 MG PO TABS
10.0000 mg | ORAL_TABLET | Freq: Every day | ORAL | 11 refills | Status: DC
  Filled 2022-11-12: qty 30, 30d supply, fill #0
  Filled 2022-12-30: qty 30, 30d supply, fill #1
  Filled 2023-02-09: qty 30, 30d supply, fill #2
  Filled 2023-06-25: qty 30, 30d supply, fill #3
  Filled 2023-08-30: qty 30, 30d supply, fill #4
  Filled 2023-09-25: qty 30, 30d supply, fill #5

## 2022-11-12 MED ORDER — FLONASE SENSIMIST 27.5 MCG/SPRAY NA SUSP
2.0000 | Freq: Every day | NASAL | 11 refills | Status: DC
  Filled 2022-11-12: qty 5.9, 30d supply, fill #0

## 2022-11-16 ENCOUNTER — Other Ambulatory Visit (HOSPITAL_COMMUNITY): Payer: Self-pay

## 2022-11-17 ENCOUNTER — Other Ambulatory Visit (HOSPITAL_COMMUNITY): Payer: Self-pay

## 2022-11-19 ENCOUNTER — Other Ambulatory Visit (HOSPITAL_COMMUNITY): Payer: Self-pay

## 2022-11-26 ENCOUNTER — Ambulatory Visit: Payer: 59 | Admitting: *Deleted

## 2022-11-26 DIAGNOSIS — M722 Plantar fascial fibromatosis: Secondary | ICD-10-CM

## 2022-11-26 DIAGNOSIS — M76822 Posterior tibial tendinitis, left leg: Secondary | ICD-10-CM

## 2022-11-26 DIAGNOSIS — M7752 Other enthesopathy of left foot: Secondary | ICD-10-CM

## 2022-11-26 NOTE — Progress Notes (Signed)
Patient presents today to pick up custom molded foot orthotics recommended by Dr. Paulla Dolly.   Orthotics were dispensed and fit was satisfactory. Reviewed instructions for break-in and wear. Written instructions given to patient.  Patient will follow up as needed.   [facility] - order # [id number]

## 2022-12-02 ENCOUNTER — Other Ambulatory Visit (HOSPITAL_COMMUNITY): Payer: Self-pay

## 2022-12-02 ENCOUNTER — Other Ambulatory Visit: Payer: Self-pay

## 2022-12-15 ENCOUNTER — Other Ambulatory Visit (HOSPITAL_COMMUNITY): Payer: Self-pay

## 2022-12-23 ENCOUNTER — Other Ambulatory Visit (HOSPITAL_COMMUNITY): Payer: Self-pay

## 2022-12-23 ENCOUNTER — Other Ambulatory Visit: Payer: Self-pay

## 2022-12-30 ENCOUNTER — Other Ambulatory Visit (HOSPITAL_COMMUNITY): Payer: Self-pay

## 2022-12-30 ENCOUNTER — Other Ambulatory Visit: Payer: Self-pay

## 2022-12-30 ENCOUNTER — Other Ambulatory Visit (INDEPENDENT_AMBULATORY_CARE_PROVIDER_SITE_OTHER): Payer: Self-pay

## 2022-12-31 ENCOUNTER — Other Ambulatory Visit (HOSPITAL_COMMUNITY): Payer: Self-pay

## 2023-01-11 ENCOUNTER — Other Ambulatory Visit (HOSPITAL_COMMUNITY): Payer: Self-pay

## 2023-01-11 ENCOUNTER — Other Ambulatory Visit: Payer: Self-pay

## 2023-01-11 MED ORDER — PREMARIN 0.625 MG PO TABS
0.6250 mg | ORAL_TABLET | Freq: Every day | ORAL | 1 refills | Status: DC
  Filled 2023-01-11: qty 30, 30d supply, fill #0
  Filled 2023-02-09: qty 30, 30d supply, fill #1

## 2023-01-15 ENCOUNTER — Other Ambulatory Visit (HOSPITAL_COMMUNITY): Payer: Self-pay

## 2023-01-15 DIAGNOSIS — L668 Other cicatricial alopecia: Secondary | ICD-10-CM | POA: Diagnosis not present

## 2023-01-15 MED ORDER — CLOBETASOL PROPIONATE 0.05 % EX SOLN
1.0000 | CUTANEOUS | 2 refills | Status: AC | PRN
  Filled 2023-01-15: qty 50, 30d supply, fill #0
  Filled 2023-01-20: qty 50, 90d supply, fill #0

## 2023-01-20 ENCOUNTER — Other Ambulatory Visit: Payer: Self-pay

## 2023-01-20 ENCOUNTER — Other Ambulatory Visit (HOSPITAL_COMMUNITY): Payer: Self-pay

## 2023-01-28 ENCOUNTER — Other Ambulatory Visit (HOSPITAL_COMMUNITY): Payer: Self-pay

## 2023-01-28 ENCOUNTER — Ambulatory Visit (HOSPITAL_COMMUNITY)
Admission: RE | Admit: 2023-01-28 | Discharge: 2023-01-28 | Disposition: A | Discharge status: Home / Self Care | Payer: Commercial Managed Care - PPO | Source: Ambulatory Visit | Attending: Internal Medicine | Admitting: Internal Medicine

## 2023-01-28 ENCOUNTER — Other Ambulatory Visit (HOSPITAL_COMMUNITY): Payer: Self-pay | Admitting: Internal Medicine

## 2023-01-28 ENCOUNTER — Encounter (HOSPITAL_COMMUNITY): Payer: Self-pay

## 2023-01-28 DIAGNOSIS — R051 Acute cough: Secondary | ICD-10-CM

## 2023-01-28 DIAGNOSIS — R059 Cough, unspecified: Secondary | ICD-10-CM | POA: Diagnosis not present

## 2023-01-28 DIAGNOSIS — J9811 Atelectasis: Secondary | ICD-10-CM | POA: Diagnosis not present

## 2023-01-28 MED ORDER — DOXYCYCLINE HYCLATE 100 MG PO CAPS
100.0000 mg | ORAL_CAPSULE | Freq: Two times a day (BID) | ORAL | 0 refills | Status: DC
  Filled 2023-01-28 – 2023-02-02 (×2): qty 20, 10d supply, fill #0

## 2023-01-29 ENCOUNTER — Other Ambulatory Visit (HOSPITAL_COMMUNITY): Payer: Self-pay

## 2023-02-01 ENCOUNTER — Other Ambulatory Visit (HOSPITAL_COMMUNITY): Payer: Self-pay

## 2023-02-01 MED ORDER — ALPRAZOLAM 0.5 MG PO TABS
0.5000 mg | ORAL_TABLET | Freq: Two times a day (BID) | ORAL | 1 refills | Status: DC
  Filled 2023-02-01 – 2023-02-25 (×2): qty 180, 90d supply, fill #0

## 2023-02-02 ENCOUNTER — Other Ambulatory Visit: Payer: Self-pay

## 2023-02-02 ENCOUNTER — Other Ambulatory Visit (HOSPITAL_COMMUNITY): Payer: Self-pay

## 2023-02-08 DIAGNOSIS — H6123 Impacted cerumen, bilateral: Secondary | ICD-10-CM | POA: Diagnosis not present

## 2023-02-08 DIAGNOSIS — Z9889 Other specified postprocedural states: Secondary | ICD-10-CM | POA: Diagnosis not present

## 2023-02-08 DIAGNOSIS — J309 Allergic rhinitis, unspecified: Secondary | ICD-10-CM | POA: Diagnosis not present

## 2023-02-09 ENCOUNTER — Other Ambulatory Visit (HOSPITAL_COMMUNITY): Payer: Self-pay

## 2023-02-09 ENCOUNTER — Other Ambulatory Visit: Payer: Self-pay

## 2023-02-09 MED ORDER — ALPRAZOLAM 0.5 MG PO TABS
0.5000 mg | ORAL_TABLET | Freq: Two times a day (BID) | ORAL | 0 refills | Status: AC
  Filled 2023-02-09 – 2023-06-29 (×3): qty 180, 90d supply, fill #0

## 2023-02-09 MED ORDER — FLUTICASONE PROPIONATE 50 MCG/ACT NA SUSP
2.0000 | Freq: Every evening | NASAL | 11 refills | Status: DC
  Filled 2023-02-09: qty 16, 30d supply, fill #0
  Filled 2023-03-08: qty 16, 30d supply, fill #1
  Filled 2023-03-29 – 2023-05-06 (×2): qty 16, 30d supply, fill #2
  Filled 2023-05-31: qty 16, 30d supply, fill #3
  Filled 2023-06-30: qty 16, 30d supply, fill #4
  Filled 2023-08-17: qty 16, 30d supply, fill #5
  Filled 2023-09-11: qty 16, 30d supply, fill #6
  Filled 2023-10-11: qty 16, 30d supply, fill #7
  Filled 2023-11-10: qty 16, 30d supply, fill #8
  Filled 2023-12-07: qty 16, 30d supply, fill #9
  Filled 2024-01-05: qty 16, 30d supply, fill #10
  Filled 2024-02-03: qty 16, 30d supply, fill #11

## 2023-02-09 MED ORDER — MELOXICAM 7.5 MG PO TABS
7.5000 mg | ORAL_TABLET | Freq: Two times a day (BID) | ORAL | 6 refills | Status: DC | PRN
  Filled 2023-02-09: qty 60, 30d supply, fill #0
  Filled 2023-09-26: qty 60, 30d supply, fill #1

## 2023-02-10 ENCOUNTER — Other Ambulatory Visit (HOSPITAL_COMMUNITY): Payer: Self-pay

## 2023-02-25 ENCOUNTER — Other Ambulatory Visit (HOSPITAL_COMMUNITY): Payer: Self-pay

## 2023-02-25 ENCOUNTER — Other Ambulatory Visit: Payer: Self-pay

## 2023-03-02 ENCOUNTER — Other Ambulatory Visit (HOSPITAL_COMMUNITY): Payer: Self-pay

## 2023-03-08 ENCOUNTER — Other Ambulatory Visit: Payer: Self-pay

## 2023-03-08 ENCOUNTER — Other Ambulatory Visit (HOSPITAL_BASED_OUTPATIENT_CLINIC_OR_DEPARTMENT_OTHER): Payer: Self-pay

## 2023-03-08 ENCOUNTER — Other Ambulatory Visit (HOSPITAL_COMMUNITY): Payer: Self-pay

## 2023-03-09 ENCOUNTER — Other Ambulatory Visit (HOSPITAL_COMMUNITY): Payer: Self-pay

## 2023-03-09 ENCOUNTER — Other Ambulatory Visit: Payer: Self-pay

## 2023-03-09 MED ORDER — PREMARIN 0.625 MG PO TABS
ORAL_TABLET | ORAL | 1 refills | Status: DC
  Filled 2023-03-09: qty 30, 30d supply, fill #0
  Filled 2023-06-25 – 2023-09-25 (×2): qty 30, 30d supply, fill #1

## 2023-03-22 DIAGNOSIS — R7301 Impaired fasting glucose: Secondary | ICD-10-CM | POA: Diagnosis not present

## 2023-03-22 DIAGNOSIS — E669 Obesity, unspecified: Secondary | ICD-10-CM | POA: Diagnosis not present

## 2023-03-22 DIAGNOSIS — I10 Essential (primary) hypertension: Secondary | ICD-10-CM | POA: Diagnosis not present

## 2023-03-29 ENCOUNTER — Other Ambulatory Visit: Payer: Self-pay

## 2023-03-29 ENCOUNTER — Other Ambulatory Visit (HOSPITAL_COMMUNITY): Payer: Self-pay

## 2023-04-06 ENCOUNTER — Other Ambulatory Visit: Payer: Self-pay

## 2023-04-15 ENCOUNTER — Other Ambulatory Visit (HOSPITAL_COMMUNITY): Payer: Self-pay

## 2023-04-15 MED ORDER — PREMARIN 0.625 MG PO TABS
0.6250 mg | ORAL_TABLET | Freq: Every day | ORAL | 3 refills | Status: DC
  Filled 2023-04-15: qty 30, 30d supply, fill #0
  Filled 2023-05-14: qty 30, 30d supply, fill #1
  Filled 2023-06-09: qty 30, 30d supply, fill #2
  Filled 2023-07-09: qty 30, 30d supply, fill #3
  Filled 2023-08-17: qty 30, 30d supply, fill #4
  Filled 2023-09-11: qty 30, 30d supply, fill #5
  Filled 2023-10-11: qty 30, 30d supply, fill #6
  Filled 2023-11-10: qty 30, 30d supply, fill #7
  Filled 2023-12-07: qty 30, 30d supply, fill #8
  Filled 2024-01-05: qty 30, 30d supply, fill #9
  Filled 2024-02-03: qty 30, 30d supply, fill #10
  Filled 2024-03-01: qty 30, 30d supply, fill #11

## 2023-04-15 MED ORDER — METHYLPREDNISOLONE 4 MG PO TBPK
ORAL_TABLET | ORAL | 0 refills | Status: DC
  Filled 2023-04-15 (×2): qty 21, 6d supply, fill #0

## 2023-04-15 MED ORDER — AMOXICILLIN-POT CLAVULANATE 875-125 MG PO TABS
1.0000 | ORAL_TABLET | Freq: Two times a day (BID) | ORAL | 0 refills | Status: DC
  Filled 2023-04-15 (×2): qty 20, 10d supply, fill #0

## 2023-05-03 ENCOUNTER — Other Ambulatory Visit (HOSPITAL_COMMUNITY): Payer: Self-pay

## 2023-05-03 MED ORDER — CIPROFLOXACIN HCL 500 MG PO TABS
500.0000 mg | ORAL_TABLET | Freq: Two times a day (BID) | ORAL | 0 refills | Status: DC
  Filled 2023-05-03: qty 14, 7d supply, fill #0

## 2023-05-03 MED ORDER — METRONIDAZOLE 500 MG PO TABS
500.0000 mg | ORAL_TABLET | Freq: Three times a day (TID) | ORAL | 0 refills | Status: DC
  Filled 2023-05-03: qty 21, 7d supply, fill #0

## 2023-05-06 ENCOUNTER — Other Ambulatory Visit (HOSPITAL_COMMUNITY): Payer: Self-pay

## 2023-05-14 ENCOUNTER — Other Ambulatory Visit (HOSPITAL_COMMUNITY): Payer: Self-pay

## 2023-05-18 ENCOUNTER — Other Ambulatory Visit (HOSPITAL_COMMUNITY): Payer: Self-pay

## 2023-05-19 ENCOUNTER — Other Ambulatory Visit (HOSPITAL_COMMUNITY): Payer: Self-pay

## 2023-05-19 MED ORDER — ALBUTEROL SULFATE HFA 108 (90 BASE) MCG/ACT IN AERS
1.0000 | INHALATION_SPRAY | RESPIRATORY_TRACT | 2 refills | Status: DC | PRN
  Filled 2023-05-19: qty 6.7, 17d supply, fill #0
  Filled 2023-09-26: qty 6.7, 17d supply, fill #1
  Filled 2023-10-11: qty 6.7, 17d supply, fill #2

## 2023-05-20 ENCOUNTER — Other Ambulatory Visit: Payer: Self-pay

## 2023-05-24 ENCOUNTER — Other Ambulatory Visit (HOSPITAL_COMMUNITY): Payer: Self-pay

## 2023-05-26 ENCOUNTER — Other Ambulatory Visit (HOSPITAL_COMMUNITY): Payer: Self-pay

## 2023-05-31 ENCOUNTER — Other Ambulatory Visit (HOSPITAL_COMMUNITY): Payer: Self-pay

## 2023-05-31 ENCOUNTER — Other Ambulatory Visit: Payer: Self-pay

## 2023-06-01 ENCOUNTER — Other Ambulatory Visit: Payer: Self-pay

## 2023-06-01 MED ORDER — JANUVIA 100 MG PO TABS
100.0000 mg | ORAL_TABLET | Freq: Every day | ORAL | 0 refills | Status: DC
  Filled 2023-06-01: qty 30, 30d supply, fill #0

## 2023-06-09 ENCOUNTER — Other Ambulatory Visit (HOSPITAL_COMMUNITY): Payer: Self-pay

## 2023-06-23 ENCOUNTER — Other Ambulatory Visit (HOSPITAL_COMMUNITY): Payer: Self-pay

## 2023-06-24 ENCOUNTER — Encounter (HOSPITAL_COMMUNITY): Payer: Self-pay

## 2023-06-24 ENCOUNTER — Other Ambulatory Visit: Payer: Self-pay

## 2023-06-24 ENCOUNTER — Other Ambulatory Visit (HOSPITAL_COMMUNITY): Payer: Self-pay

## 2023-06-24 DIAGNOSIS — E785 Hyperlipidemia, unspecified: Secondary | ICD-10-CM | POA: Diagnosis not present

## 2023-06-24 DIAGNOSIS — E1165 Type 2 diabetes mellitus with hyperglycemia: Secondary | ICD-10-CM | POA: Diagnosis not present

## 2023-06-24 DIAGNOSIS — I10 Essential (primary) hypertension: Secondary | ICD-10-CM | POA: Diagnosis not present

## 2023-06-24 MED ORDER — JARDIANCE 10 MG PO TABS
10.0000 mg | ORAL_TABLET | Freq: Every day | ORAL | 3 refills | Status: DC
  Filled 2023-06-24: qty 30, 30d supply, fill #0

## 2023-06-25 ENCOUNTER — Other Ambulatory Visit: Payer: Self-pay

## 2023-06-25 ENCOUNTER — Other Ambulatory Visit (HOSPITAL_COMMUNITY): Payer: Self-pay

## 2023-06-25 MED ORDER — HYDROCHLOROTHIAZIDE 12.5 MG PO TABS
25.0000 mg | ORAL_TABLET | Freq: Every day | ORAL | 3 refills | Status: DC
  Filled 2023-06-25: qty 180, 90d supply, fill #0

## 2023-06-28 ENCOUNTER — Other Ambulatory Visit (HOSPITAL_BASED_OUTPATIENT_CLINIC_OR_DEPARTMENT_OTHER): Payer: Self-pay

## 2023-06-29 ENCOUNTER — Other Ambulatory Visit (HOSPITAL_COMMUNITY): Payer: Self-pay

## 2023-06-30 ENCOUNTER — Other Ambulatory Visit (HOSPITAL_COMMUNITY): Payer: Self-pay

## 2023-07-02 ENCOUNTER — Other Ambulatory Visit (HOSPITAL_COMMUNITY): Payer: Self-pay

## 2023-07-05 ENCOUNTER — Other Ambulatory Visit (HOSPITAL_COMMUNITY): Payer: Self-pay

## 2023-07-05 DIAGNOSIS — M47816 Spondylosis without myelopathy or radiculopathy, lumbar region: Secondary | ICD-10-CM | POA: Diagnosis not present

## 2023-07-05 DIAGNOSIS — R350 Frequency of micturition: Secondary | ICD-10-CM | POA: Diagnosis not present

## 2023-07-05 DIAGNOSIS — Z6841 Body Mass Index (BMI) 40.0 and over, adult: Secondary | ICD-10-CM | POA: Diagnosis not present

## 2023-07-05 DIAGNOSIS — M5416 Radiculopathy, lumbar region: Secondary | ICD-10-CM | POA: Diagnosis not present

## 2023-07-05 DIAGNOSIS — I10 Essential (primary) hypertension: Secondary | ICD-10-CM | POA: Diagnosis not present

## 2023-07-05 DIAGNOSIS — E1165 Type 2 diabetes mellitus with hyperglycemia: Secondary | ICD-10-CM | POA: Diagnosis not present

## 2023-07-05 DIAGNOSIS — N390 Urinary tract infection, site not specified: Secondary | ICD-10-CM | POA: Diagnosis not present

## 2023-07-05 DIAGNOSIS — E1159 Type 2 diabetes mellitus with other circulatory complications: Secondary | ICD-10-CM | POA: Diagnosis not present

## 2023-07-05 DIAGNOSIS — T50905A Adverse effect of unspecified drugs, medicaments and biological substances, initial encounter: Secondary | ICD-10-CM | POA: Diagnosis not present

## 2023-07-05 MED ORDER — METHYLPREDNISOLONE 4 MG PO TBPK
ORAL_TABLET | ORAL | 0 refills | Status: DC
  Filled 2023-09-26: qty 21, 6d supply, fill #0

## 2023-07-05 MED ORDER — FLUCONAZOLE 150 MG PO TABS
150.0000 mg | ORAL_TABLET | ORAL | 0 refills | Status: DC
  Filled 2023-07-05: qty 1, 1d supply, fill #0

## 2023-07-08 ENCOUNTER — Encounter: Payer: Self-pay | Admitting: Internal Medicine

## 2023-07-08 ENCOUNTER — Other Ambulatory Visit: Payer: Self-pay | Admitting: Internal Medicine

## 2023-07-08 ENCOUNTER — Other Ambulatory Visit (HOSPITAL_COMMUNITY): Payer: Self-pay | Admitting: Internal Medicine

## 2023-07-08 DIAGNOSIS — M5416 Radiculopathy, lumbar region: Secondary | ICD-10-CM

## 2023-07-08 DIAGNOSIS — Z0000 Encounter for general adult medical examination without abnormal findings: Secondary | ICD-10-CM

## 2023-07-08 DIAGNOSIS — Z1231 Encounter for screening mammogram for malignant neoplasm of breast: Secondary | ICD-10-CM

## 2023-07-09 ENCOUNTER — Other Ambulatory Visit: Payer: Self-pay

## 2023-07-12 NOTE — Progress Notes (Deleted)
Cardiology Office Note   Date:  07/12/2023   ID:  [name], DOB May 27, 1961, MRN [id number]  PCP:  Elfredia Nevins, MD  Cardiologist:   Dietrich Pates, MD   Patient presents for f/u of HTN    History of Present Illness: [name] is a 62 y.o. female with a history of HTN, DM, diverticulities, gastritis, obesity She also had mild atheroscleroitic dz of aoreta I saw her back in 2019  She was seen by B Strader in 2021   Since seen the pt denies CP   Breathing is OK   She is very active   Mows the lawn   WOrks about 6 days per week    Glu good 120s BP 158/78 a few wks ago   She did  Breakfast:  oatmeal/berries or toast with [employer]   May skip [employer], meet     Works night shift   930 PM      I saw the pt in clinic in July 2022  No outpatient medications have been marked as taking for the 07/15/23 encounter (Appointment) with Pricilla Riffle, MD.     Allergies:   Barium-containing compounds, Cimetidine, Statins, Sulfa antibiotics, and Victoza [liraglutide]   Past Medical History:  Diagnosis Date   Anxiety    Asthma    diagnosed as an adult, mild   Back pain    Diabetes mellitus    Diverticulosis 01/13/2018   Gallbladder problem    GERD (gastroesophageal reflux disease)    Hypertension    Obesity    SOBOE (shortness of breath on exertion)     Past Surgical History:  Procedure Laterality Date   ABDOMINAL HYSTERECTOMY  2008   CARPAL TUNNEL RELEASE     CARPAL TUNNEL RELEASE Right 10/01/2021   Procedure: RIGHT CARPAL TUNNEL RELEASE;  Surgeon: Frederico Hamman, MD;  Location: [facility];  Service: Orthopedics;  Laterality: Right;   CESAREAN SECTION  1983   CHOLECYSTECTOMY  2002   SINUS EXPLORATION  1995   TONSILLECTOMY     TRIGGER FINGER RELEASE     WRIST SURGERY  1979     Social History:  The patient  reports that she quit smoking about 34 years ago. Her smoking use included cigarettes. She has never used smokeless  tobacco. She reports that she does not drink alcohol and does not use drugs.   Family History:  The patient's family history includes Diabetes in her mother; Hypertension in her mother; Obesity in her father and mother; Stroke in her mother.    ROS:  Please see the history of present illness. All other systems are reviewed and  Negative to the above problem except as noted.    PHYSICAL EXAM: VS:  There were no vitals taken for this visit.  GEN  Pt is is ,morbidlyobese pt in no acute distress  HEENT: normal  Neck: no JVD, carotid bruits  Cardiac: RRR; no murmurs,no LE edema  Respiratory:  clear to auscultation bilaterally, normal work of breathing GI: soft, nontender,distended, + BS  No RUQ tenderness MS: no deformity Moving all extremities   Skin: warm and dry, no rash Neuro:  Strength and sensation are intact Psych: euthymic mood, full affect   EKG:  EKG is not ordered    Lipid Panel    Component Value Date/Time   CHOL 197 06/30/2021 0953   CHOL 221 (H) 06/20/2020 1410   TRIG 133 06/30/2021 0953   HDL 69 06/30/2021  0953   HDL 78 06/20/2020 1410   CHOLHDL 2.9 06/30/2021 0953   VLDL 27 06/30/2021 0953   LDLCALC 101 (H) 06/30/2021 0953   LDLCALC 128 (H) 06/20/2020 1410      Wt Readings from Last 3 Encounters:  03/16/22 238 lb (108 kg)  02/23/22 239 lb (108.4 kg)  01/28/22 241 lb (109.3 kg)      ASSESSMENT AND PLAN:  1  HTN  Not optimally controlled   Was 150 to 158 on my check   Will get labs   Review     She has had low K in past   COnsider aldactone   2   HL   Scattered plaques o nCT   Did not tolearte statins   Consider Zetia  Recheck today    3   Morbid obesity  Discussed TRE   Also recomm a  ketogenic diet  Limit carbs   4  DM  Would recomm d/c carb heck A1C     Current medicines are reviewed at length with the patient today.  The patient does not have concerns regarding medicines.  Signed, Dietrich Pates, MD  07/12/2023 5:29 PM    [facility] [address] [place], [place], [place]  [address] Phone: [phone]; Fax: [phone]

## 2023-07-15 ENCOUNTER — Ambulatory Visit: Payer: Commercial Managed Care - PPO | Admitting: Internal Medicine

## 2023-07-16 ENCOUNTER — Ambulatory Visit: Payer: Commercial Managed Care - PPO

## 2023-07-21 ENCOUNTER — Other Ambulatory Visit (HOSPITAL_COMMUNITY): Payer: Self-pay

## 2023-07-21 DIAGNOSIS — Z0001 Encounter for general adult medical examination with abnormal findings: Secondary | ICD-10-CM | POA: Diagnosis not present

## 2023-07-21 DIAGNOSIS — G894 Chronic pain syndrome: Secondary | ICD-10-CM | POA: Diagnosis not present

## 2023-07-21 DIAGNOSIS — M47816 Spondylosis without myelopathy or radiculopathy, lumbar region: Secondary | ICD-10-CM | POA: Diagnosis not present

## 2023-07-21 DIAGNOSIS — E1159 Type 2 diabetes mellitus with other circulatory complications: Secondary | ICD-10-CM | POA: Diagnosis not present

## 2023-07-21 DIAGNOSIS — M2550 Pain in unspecified joint: Secondary | ICD-10-CM | POA: Diagnosis not present

## 2023-07-21 DIAGNOSIS — E1165 Type 2 diabetes mellitus with hyperglycemia: Secondary | ICD-10-CM | POA: Diagnosis not present

## 2023-07-21 DIAGNOSIS — I10 Essential (primary) hypertension: Secondary | ICD-10-CM | POA: Diagnosis not present

## 2023-07-21 DIAGNOSIS — Z1331 Encounter for screening for depression: Secondary | ICD-10-CM | POA: Diagnosis not present

## 2023-07-21 DIAGNOSIS — M1991 Primary osteoarthritis, unspecified site: Secondary | ICD-10-CM | POA: Diagnosis not present

## 2023-07-21 DIAGNOSIS — M5412 Radiculopathy, cervical region: Secondary | ICD-10-CM | POA: Diagnosis not present

## 2023-07-21 MED ORDER — POTASSIUM CHLORIDE CRYS ER 20 MEQ PO TBCR
20.0000 meq | EXTENDED_RELEASE_TABLET | Freq: Every day | ORAL | 3 refills | Status: DC
  Filled 2023-07-21: qty 90, 90d supply, fill #0

## 2023-07-21 MED ORDER — HYDROCHLOROTHIAZIDE 50 MG PO TABS
50.0000 mg | ORAL_TABLET | Freq: Every day | ORAL | 3 refills | Status: DC
  Filled 2023-07-21: qty 90, 90d supply, fill #0
  Filled 2024-03-07: qty 90, 90d supply, fill #1
  Filled 2024-07-17: qty 90, 90d supply, fill #2

## 2023-07-22 ENCOUNTER — Other Ambulatory Visit: Payer: Self-pay

## 2023-07-28 DIAGNOSIS — H25813 Combined forms of age-related cataract, bilateral: Secondary | ICD-10-CM | POA: Diagnosis not present

## 2023-07-28 DIAGNOSIS — H353131 Nonexudative age-related macular degeneration, bilateral, early dry stage: Secondary | ICD-10-CM | POA: Diagnosis not present

## 2023-08-02 ENCOUNTER — Encounter (HOSPITAL_COMMUNITY): Payer: Self-pay

## 2023-08-02 ENCOUNTER — Ambulatory Visit (HOSPITAL_COMMUNITY): Payer: Commercial Managed Care - PPO

## 2023-08-03 ENCOUNTER — Other Ambulatory Visit (HOSPITAL_COMMUNITY): Payer: Self-pay

## 2023-08-03 MED ORDER — AMLODIPINE BESYLATE 10 MG PO TABS
10.0000 mg | ORAL_TABLET | Freq: Every day | ORAL | 3 refills | Status: DC
  Filled 2023-08-03: qty 90, 90d supply, fill #0
  Filled 2023-10-28: qty 90, 90d supply, fill #1
  Filled 2024-01-26: qty 90, 90d supply, fill #2
  Filled 2024-04-18: qty 90, 90d supply, fill #3

## 2023-08-12 DIAGNOSIS — H353131 Nonexudative age-related macular degeneration, bilateral, early dry stage: Secondary | ICD-10-CM | POA: Diagnosis not present

## 2023-08-12 DIAGNOSIS — H524 Presbyopia: Secondary | ICD-10-CM | POA: Diagnosis not present

## 2023-08-12 DIAGNOSIS — H25813 Combined forms of age-related cataract, bilateral: Secondary | ICD-10-CM | POA: Diagnosis not present

## 2023-08-12 DIAGNOSIS — E119 Type 2 diabetes mellitus without complications: Secondary | ICD-10-CM | POA: Diagnosis not present

## 2023-08-17 ENCOUNTER — Other Ambulatory Visit: Payer: Self-pay

## 2023-08-17 ENCOUNTER — Other Ambulatory Visit (HOSPITAL_COMMUNITY): Payer: Self-pay

## 2023-08-17 MED ORDER — SITAGLIPTIN PHOSPHATE 100 MG PO TABS
100.0000 mg | ORAL_TABLET | Freq: Every day | ORAL | 2 refills | Status: DC
  Filled 2023-08-17: qty 30, 30d supply, fill #0
  Filled 2023-09-12: qty 30, 30d supply, fill #1
  Filled 2023-10-17: qty 30, 30d supply, fill #2

## 2023-08-30 ENCOUNTER — Other Ambulatory Visit (HOSPITAL_COMMUNITY): Payer: Self-pay

## 2023-08-30 ENCOUNTER — Other Ambulatory Visit: Payer: Self-pay

## 2023-08-30 MED ORDER — METFORMIN HCL ER 500 MG PO TB24
500.0000 mg | ORAL_TABLET | Freq: Two times a day (BID) | ORAL | 1 refills | Status: DC
  Filled 2023-08-30: qty 180, 90d supply, fill #0
  Filled 2023-12-01: qty 180, 90d supply, fill #1

## 2023-08-30 MED ORDER — METOPROLOL SUCCINATE ER 100 MG PO TB24
100.0000 mg | ORAL_TABLET | Freq: Every day | ORAL | 1 refills | Status: DC
  Filled 2023-08-30: qty 90, 90d supply, fill #0
  Filled 2023-12-01: qty 90, 90d supply, fill #1

## 2023-08-30 MED ORDER — PANTOPRAZOLE SODIUM 40 MG PO TBEC
40.0000 mg | DELAYED_RELEASE_TABLET | Freq: Every day | ORAL | 3 refills | Status: DC
  Filled 2023-08-30: qty 90, 90d supply, fill #0
  Filled 2023-12-01: qty 90, 90d supply, fill #1
  Filled 2024-02-24: qty 90, 90d supply, fill #2
  Filled 2024-05-29: qty 90, 90d supply, fill #3

## 2023-09-02 ENCOUNTER — Other Ambulatory Visit (HOSPITAL_COMMUNITY): Payer: Self-pay

## 2023-09-12 ENCOUNTER — Other Ambulatory Visit (HOSPITAL_COMMUNITY): Payer: Self-pay

## 2023-09-27 ENCOUNTER — Other Ambulatory Visit: Payer: Self-pay

## 2023-09-28 ENCOUNTER — Other Ambulatory Visit: Payer: Self-pay

## 2023-09-30 ENCOUNTER — Other Ambulatory Visit: Payer: Self-pay

## 2023-09-30 ENCOUNTER — Encounter (HOSPITAL_COMMUNITY): Payer: Self-pay | Admitting: Emergency Medicine

## 2023-09-30 ENCOUNTER — Emergency Department (HOSPITAL_COMMUNITY)
Admission: EM | Admit: 2023-09-30 | Discharge: 2023-09-30 | Disposition: A | Discharge status: Home / Self Care | Payer: Commercial Managed Care - PPO | Attending: Emergency Medicine | Admitting: Emergency Medicine

## 2023-09-30 DIAGNOSIS — R197 Diarrhea, unspecified: Secondary | ICD-10-CM | POA: Insufficient documentation

## 2023-09-30 DIAGNOSIS — E119 Type 2 diabetes mellitus without complications: Secondary | ICD-10-CM | POA: Diagnosis not present

## 2023-09-30 DIAGNOSIS — I10 Essential (primary) hypertension: Secondary | ICD-10-CM | POA: Insufficient documentation

## 2023-09-30 DIAGNOSIS — E1169 Type 2 diabetes mellitus with other specified complication: Secondary | ICD-10-CM

## 2023-09-30 DIAGNOSIS — R55 Syncope and collapse: Secondary | ICD-10-CM | POA: Insufficient documentation

## 2023-09-30 DIAGNOSIS — Z7982 Long term (current) use of aspirin: Secondary | ICD-10-CM | POA: Insufficient documentation

## 2023-09-30 DIAGNOSIS — Z7984 Long term (current) use of oral hypoglycemic drugs: Secondary | ICD-10-CM | POA: Insufficient documentation

## 2023-09-30 DIAGNOSIS — E876 Hypokalemia: Secondary | ICD-10-CM | POA: Insufficient documentation

## 2023-09-30 DIAGNOSIS — L299 Pruritus, unspecified: Secondary | ICD-10-CM | POA: Diagnosis not present

## 2023-09-30 DIAGNOSIS — R531 Weakness: Secondary | ICD-10-CM | POA: Diagnosis not present

## 2023-09-30 DIAGNOSIS — Z79899 Other long term (current) drug therapy: Secondary | ICD-10-CM | POA: Insufficient documentation

## 2023-09-30 LAB — COMPREHENSIVE METABOLIC PANEL
ALT: 17 U/L (ref 0–44)
AST: 15 U/L (ref 15–41)
Albumin: 3.6 g/dL (ref 3.5–5.0)
Alkaline Phosphatase: 39 U/L (ref 38–126)
Anion gap: 11 (ref 5–15)
BUN: 13 mg/dL (ref 8–23)
CO2: 23 mmol/L (ref 22–32)
Calcium: 8.7 mg/dL — ABNORMAL LOW (ref 8.9–10.3)
Chloride: 97 mmol/L — ABNORMAL LOW (ref 98–111)
Creatinine, Ser: 0.61 mg/dL (ref 0.44–1.00)
GFR, Estimated: >60 mL/min (ref >60)
Glucose, Bld: 192 mg/dL — ABNORMAL HIGH (ref 70–99)
Potassium: 3.2 mmol/L — ABNORMAL LOW (ref 3.5–5.1)
Sodium: 131 mmol/L — ABNORMAL LOW (ref 135–145)
Total Bilirubin: 0.5 mg/dL (ref 0.3–1.2)
Total Protein: 7.3 g/dL (ref 6.5–8.1)

## 2023-09-30 LAB — CBC WITH DIFFERENTIAL/PLATELET
Abs Immature Granulocytes: 0.05 10*3/uL (ref 0.00–0.07)
Basophils Absolute: 0 10*3/uL (ref 0.0–0.1)
Basophils Relative: 0 %
Eosinophils Absolute: 0.1 10*3/uL (ref 0.0–0.5)
Eosinophils Relative: 1 %
HCT: 38.7 % (ref 36.0–46.0)
Hemoglobin: 13.5 g/dL (ref 12.0–15.0)
Immature Granulocytes: 1 %
Lymphocytes Relative: 18 %
Lymphs Abs: 1.8 10*3/uL (ref 0.7–4.0)
MCH: 30.5 pg (ref 26.0–34.0)
MCHC: 34.9 g/dL (ref 30.0–36.0)
MCV: 87.4 fL (ref 80.0–100.0)
Monocytes Absolute: 0.7 10*3/uL (ref 0.1–1.0)
Monocytes Relative: 7 %
Neutro Abs: 7.2 10*3/uL (ref 1.7–7.7)
Neutrophils Relative %: 73 %
Platelets: 294 10*3/uL (ref 150–400)
RBC: 4.43 MIL/uL (ref 3.87–5.11)
RDW: 13.1 % (ref 11.5–15.5)
WBC: 9.8 10*3/uL (ref 4.0–10.5)
nRBC: 0 % (ref 0.0–0.2)

## 2023-09-30 LAB — LIPASE, BLOOD: Lipase: 20 U/L (ref 11–51)

## 2023-09-30 LAB — MAGNESIUM: Magnesium: 1.7 mg/dL (ref 1.7–2.4)

## 2023-09-30 LAB — TROPONIN I (HIGH SENSITIVITY): Troponin I (High Sensitivity): 5 ng/L (ref <18)

## 2023-09-30 MED ORDER — ONDANSETRON 4 MG PO TBDP
4.0000 mg | ORAL_TABLET | Freq: Three times a day (TID) | ORAL | 0 refills | Status: DC | PRN
  Filled 2023-09-30: qty 20, 7d supply, fill #0

## 2023-09-30 MED ORDER — POTASSIUM CHLORIDE CRYS ER 20 MEQ PO TBCR
20.0000 meq | EXTENDED_RELEASE_TABLET | Freq: Every day | ORAL | 3 refills | Status: AC
  Filled 2023-09-30 – 2023-10-17 (×2): qty 90, 90d supply, fill #0

## 2023-09-30 MED ORDER — AMOXICILLIN-POT CLAVULANATE 875-125 MG PO TABS
1.0000 | ORAL_TABLET | Freq: Two times a day (BID) | ORAL | 0 refills | Status: DC
  Filled 2023-09-30: qty 14, 7d supply, fill #0

## 2023-09-30 MED ORDER — EPINEPHRINE 0.3 MG/0.3ML IJ SOAJ
0.3000 mg | INTRAMUSCULAR | 11 refills | Status: AC | PRN
  Filled 2023-09-30: qty 2, 30d supply, fill #0

## 2023-09-30 NOTE — ED Triage Notes (Signed)
Pt to ed via [facility] c/o weakness with near syncopal event. C/o diarrhea, hot flashes, itching that started tonight. Pt received flu vaccine Tuesday  Per ems VS: BP: 178/74 HR 74 99% RA CBG 161

## 2023-09-30 NOTE — ED Provider Notes (Signed)
[place] EMERGENCY DEPARTMENT AT [facility] Provider Note   CSN: [id number] Arrival date & time: 09/30/23  0157     History  No chief complaint on file.   [name] is a 62 y.o. female.  63 year old female with history of hypertension and diabetes who presents ER today secondary to diarrhea syncopal episode.  Patient states that she was feeling unwell and thought she had a bowel movement so she went to the bathroom.  She had multiple episodes of nonbloody nonbilious mucousy diarrhea.  She subsequently felt weak and syncopized.  Did not fall and hit her head.  This was around 2100 Wednesday evening.  She felt okay and that she had another episode just prior to arrival.  EMS was called and she was hypertensive, normal heart rate, normal blood sugar and was brought here for further evaluation.  Patient states she feels well at this time.  States she no known sick contacts but she does work in the hospital and she states that she had the flu shot on Tuesday.  After the second episode of syncope she did have some pruritus but took some Benadryl and that seems to have improved it.  On exam she had left lower quadrant tenderness that she states she has a history of having diverticulitis and without tenderness she thinks it might be that again but has not been constipated recently.  No fevers.  No other episodes outside of when she is having diarrhea.  No associated chest pain, back pain, severe headache, palpitations.  She does have a history of what sounds like vagal syncope in the past with bowel movements.        Home Medications Prior to Admission medications   Medication Sig Start Date End Date Taking? Authorizing Provider  amoxicillin-clavulanate (AUGMENTIN) 875-125 MG tablet Take 1 tablet by mouth every 12 (twelve) hours. 09/30/23  Yes Latavia Goga, Barbara Cower, MD  albuterol (VENTOLIN HFA) 108 (90 Base) MCG/ACT inhaler Inhale 1-2 puffs into the lungs every 4 (four) hours as  needed. 05/19/23     ALPRAZolam (XANAX) 0.5 MG tablet Take 1 tablet (0.5 mg total) by mouth 2 (two) times daily. 02/09/23     amLODipine (NORVASC) 10 MG tablet Take 1 tablet (10 mg total) by mouth daily. 08/03/23     aspirin EC 81 MG tablet Take 81 mg by mouth daily.    [provider]  clobetasol (TEMOVATE) 0.05 % external solution Apply to scalp every other day as needed. 01/15/23     cyanocobalamin (,VITAMIN B-12,) 1000 MCG/ML injection Inject 1 ml weekly for 8 weeks, then 1 ml every 2 weeks 01/28/22   Helane Rima, DO  EPINEPHrine 0.3 mg/0.3 mL IJ SOAJ injection Inject 0.3 mg into the muscle as needed for anaphylaxis. 09/30/23   Taya Ashbaugh, Barbara Cower, MD  estrogens, conjugated, (PREMARIN) 0.625 MG tablet Take 1 tablet (0.625 mg total) by mouth daily. 04/15/23     fluticasone (FLONASE) 50 MCG/ACT nasal spray Place 2 sprays into both nostrils Nightly. 02/08/23     glipiZIDE (GLUCOTROL) 10 MG tablet Take 1 tablet (10 mg total) by mouth daily. 11/11/22     hydrochlorothiazide (HYDRODIURIL) 50 MG tablet Take 1 tablet (50 mg total) by mouth daily. 07/21/23     levocetirizine (XYZAL ALLERGY 24HR) 5 MG tablet Take 1 tablet (5 mg total) by mouth daily. 02/11/22     meloxicam (MOBIC) 7.5 MG tablet Take 1 tablet (7.5 mg total) by mouth daily. 01/07/22   Helane Rima, DO  meloxicam (MOBIC) 7.5 MG tablet Take 1 tablet (7.5 mg total) by mouth 2 (two) times daily as needed. 02/05/23     metFORMIN (GLUCOPHAGE-XR) 500 MG 24 hr tablet Take 1 tablet (500 mg total) by mouth 2 (two) times daily. 08/30/23     metoCLOPramide (REGLAN) 5 MG tablet Take 1 tablet (5 mg total) by mouth 3 (three) times daily before meals. 08/28/22     metoprolol succinate (TOPROL-XL) 100 MG 24 hr tablet Take 1 tablet (100 mg total) by mouth daily. 08/30/23     olmesartan (BENICAR) 40 MG tablet Take 1 tablet (40 mg total) by mouth daily. 11/11/22     ondansetron (ZOFRAN-ODT) 4 MG disintegrating tablet Take 1 tablet (4 mg total) by mouth every 8 (eight)  hours as needed for nausea or vomiting. 09/30/23   Isidoro Santillana, Barbara Cower, MD  pantoprazole (PROTONIX) 40 MG tablet Take 1 tablet (40 mg total) by mouth daily. 08/30/23     potassium chloride SA (KLOR-CON M) 20 MEQ tablet Take 1 tablet (20 mEq total) by mouth daily. 09/30/23   Guy Seese, Barbara Cower, MD  sitaGLIPtin (JANUVIA) 100 MG tablet Take 1 tablet (100 mg total) by mouth daily. Call office to set up a wellness 08/17/23     traMADol (ULTRAM) 50 MG tablet Take 1 tablet (50 mg total) by mouth every 6 (six) hours as needed. 08/28/21   Ralene Cork, DO  traZODone (DESYREL) 50 MG tablet Take 0.5-1 tablets (25-50 mg total) by mouth at bedtime as needed. 07/21/22     Vitamin D, Ergocalciferol, (DRISDOL) 1.25 MG (50000 UNIT) CAPS capsule Take 1 capsule (50,000 Units total) by mouth every 7 (seven) days. 01/28/22   Helane Rima, DO  zolpidem (AMBIEN) 10 MG tablet Take 1 tablet (10 mg total) by mouth at bedtime as needed for sleep. 07/21/22         Allergies    Barium-containing compounds, Cimetidine, Statins, Sulfa antibiotics, and Victoza [liraglutide]    Review of Systems   Review of Systems  Physical Exam Updated Vital Signs BP (!) 153/71   Pulse 76   Temp 97.9 F (36.6 C) (Oral)   Resp 13   Ht 5\' 3"  (1.6 m)   Wt 112.5 kg   SpO2 95%   BMI 43.93 kg/m  Physical Exam Vitals and nursing note reviewed.  Constitutional:      Appearance: She is well-developed.  HENT:     Head: Normocephalic and atraumatic.     Mouth/Throat:     Mouth: Mucous membranes are dry.  Eyes:     Pupils: Pupils are equal, round, and reactive to light.  Cardiovascular:     Rate and Rhythm: Normal rate and regular rhythm.  Pulmonary:     Effort: No respiratory distress.     Breath sounds: No stridor.  Abdominal:     General: There is no distension.     Tenderness: There is abdominal tenderness (LLQ).  Musculoskeletal:     Cervical back: Normal range of motion.  Skin:    General: Skin is warm and dry.  Neurological:      General: No focal deficit present.     Mental Status: She is alert.     ED Results / Procedures / Treatments   Labs (all labs ordered are listed, but only abnormal results are displayed) Labs Reviewed  COMPREHENSIVE METABOLIC PANEL - Abnormal; Notable for the following components:      Result Value   Sodium 131 (*)    Potassium 3.2 (*)  Chloride 97 (*)    Glucose, Bld 192 (*)    Calcium 8.7 (*)    All other components within normal limits  CBC WITH DIFFERENTIAL/PLATELET  MAGNESIUM  LIPASE, BLOOD  TROPONIN I (HIGH SENSITIVITY)    EKG EKG Interpretation Date/Time:  Thursday September 30 2023 02:22:39 EDT Ventricular Rate:  76 PR Interval:  179 QRS Duration:  104 QT Interval:  444 QTC Calculation: 500 R Axis:   77  Text Interpretation: Sinus rhythm Consider left atrial enlargement Borderline prolonged QT interval Confirmed by Marily Memos [id number]) on 09/30/2023 4:07:21 AM  Radiology No results found.  Procedures Procedures    Medications Ordered in ED Medications - No data to display  ED Course/ Medical Decision Making/ A&P                                 Medical Decision Making Amount and/or Complexity of Data Reviewed Labs: ordered.  Risk Prescription drug management.  Suspect vasovagal syncope related to the bowel movement but will check electrolytes and troponin secondary to medical history.  With the current IV fluid shortage and low suspicion for surgical process we will have her orally rehydrate for now.  She does not appear to be severely dehydrated at this time. Workup is overall reassuring.  No further syncopal episodes.  Able to ambulate without difficulty.  Overall patient is improved, no indication for further workup in the ER hospitalization at this time.  Will orally rehydrate at home and take it easy for the next couple days.  Work note provided.  Final Clinical Impression(s) / ED Diagnoses Final diagnoses:  Diarrhea, unspecified type   Syncope, unspecified syncope type  Hypokalemia    Rx / DC Orders ED Discharge Orders          Ordered    ondansetron (ZOFRAN-ODT) 4 MG disintegrating tablet  Every 8 hours PRN        09/30/23 0455    EPINEPHrine 0.3 mg/0.3 mL IJ SOAJ injection  As needed        09/30/23 0455    potassium chloride SA (KLOR-CON M) 20 MEQ tablet  Daily        09/30/23 0455    amoxicillin-clavulanate (AUGMENTIN) 875-125 MG tablet  Every 12 hours        09/30/23 0457              Tamakia Porto, Barbara Cower, MD 10/01/23 [phone]

## 2023-09-30 NOTE — ED Notes (Signed)
Pt ambulated to the restroom without incident.

## 2023-10-01 ENCOUNTER — Other Ambulatory Visit (HOSPITAL_COMMUNITY): Payer: Self-pay

## 2023-10-01 ENCOUNTER — Other Ambulatory Visit: Payer: Self-pay

## 2023-10-01 MED ORDER — GEMTESA 75 MG PO TABS
75.0000 mg | ORAL_TABLET | Freq: Every day | ORAL | 5 refills | Status: DC
  Filled 2023-10-01 – 2023-12-28 (×4): qty 30, 30d supply, fill #0
  Filled 2024-07-06: qty 90, 90d supply, fill #1

## 2023-10-07 ENCOUNTER — Other Ambulatory Visit: Payer: Self-pay

## 2023-10-11 ENCOUNTER — Other Ambulatory Visit (HOSPITAL_COMMUNITY): Payer: Self-pay

## 2023-10-15 DIAGNOSIS — M25561 Pain in right knee: Secondary | ICD-10-CM | POA: Diagnosis not present

## 2023-10-15 DIAGNOSIS — J069 Acute upper respiratory infection, unspecified: Secondary | ICD-10-CM | POA: Diagnosis not present

## 2023-10-18 ENCOUNTER — Other Ambulatory Visit: Payer: Self-pay

## 2023-10-22 ENCOUNTER — Other Ambulatory Visit (HOSPITAL_COMMUNITY): Payer: Self-pay

## 2023-10-22 ENCOUNTER — Other Ambulatory Visit (HOSPITAL_BASED_OUTPATIENT_CLINIC_OR_DEPARTMENT_OTHER): Payer: Self-pay

## 2023-10-22 DIAGNOSIS — J329 Chronic sinusitis, unspecified: Secondary | ICD-10-CM | POA: Diagnosis not present

## 2023-10-22 MED ORDER — IPRATROPIUM BROMIDE 0.06 % NA SOLN
2.0000 | Freq: Three times a day (TID) | NASAL | 5 refills | Status: DC
  Filled 2023-10-22: qty 15, 28d supply, fill #0
  Filled 2023-11-14: qty 15, 28d supply, fill #1
  Filled 2023-12-13: qty 15, 28d supply, fill #2
  Filled 2024-01-10: qty 15, 28d supply, fill #3
  Filled 2024-02-03: qty 15, 28d supply, fill #4
  Filled 2024-03-01: qty 15, 28d supply, fill #5

## 2023-10-22 MED ORDER — DOXYCYCLINE MONOHYDRATE 100 MG PO CAPS
100.0000 mg | ORAL_CAPSULE | Freq: Two times a day (BID) | ORAL | 0 refills | Status: DC
  Filled 2023-10-22: qty 20, 10d supply, fill #0

## 2023-10-23 ENCOUNTER — Other Ambulatory Visit (HOSPITAL_COMMUNITY): Payer: Self-pay

## 2023-10-23 MED ORDER — ALPRAZOLAM 0.5 MG PO TABS
0.5000 mg | ORAL_TABLET | Freq: Two times a day (BID) | ORAL | 0 refills | Status: DC
  Filled 2023-10-23 (×2): qty 180, 90d supply, fill #0

## 2023-10-25 ENCOUNTER — Other Ambulatory Visit: Payer: Self-pay

## 2023-10-25 ENCOUNTER — Other Ambulatory Visit (HOSPITAL_COMMUNITY): Payer: Self-pay

## 2023-10-28 ENCOUNTER — Other Ambulatory Visit (HOSPITAL_COMMUNITY): Payer: Self-pay

## 2023-11-01 ENCOUNTER — Other Ambulatory Visit (HOSPITAL_COMMUNITY): Payer: Self-pay

## 2023-11-01 DIAGNOSIS — M25551 Pain in right hip: Secondary | ICD-10-CM | POA: Diagnosis not present

## 2023-11-01 DIAGNOSIS — M25561 Pain in right knee: Secondary | ICD-10-CM | POA: Diagnosis not present

## 2023-11-01 DIAGNOSIS — M545 Low back pain, unspecified: Secondary | ICD-10-CM | POA: Diagnosis not present

## 2023-11-04 ENCOUNTER — Ambulatory Visit
Admission: RE | Admit: 2023-11-04 | Discharge: 2023-11-04 | Disposition: A | Discharge status: Home / Self Care | Payer: Commercial Managed Care - PPO | Source: Ambulatory Visit | Attending: Internal Medicine | Admitting: Internal Medicine

## 2023-11-04 DIAGNOSIS — Z1231 Encounter for screening mammogram for malignant neoplasm of breast: Secondary | ICD-10-CM

## 2023-11-10 ENCOUNTER — Other Ambulatory Visit (HOSPITAL_COMMUNITY): Payer: Self-pay

## 2023-11-10 ENCOUNTER — Other Ambulatory Visit: Payer: Self-pay

## 2023-11-10 MED ORDER — SITAGLIPTIN PHOSPHATE 100 MG PO TABS
100.0000 mg | ORAL_TABLET | Freq: Every day | ORAL | 2 refills | Status: DC
  Filled 2023-11-10 – 2023-11-14 (×2): qty 30, 30d supply, fill #0
  Filled 2023-12-13: qty 30, 30d supply, fill #1
  Filled 2024-01-10: qty 30, 30d supply, fill #2

## 2023-11-12 ENCOUNTER — Other Ambulatory Visit (HOSPITAL_COMMUNITY): Payer: Self-pay

## 2023-11-15 ENCOUNTER — Other Ambulatory Visit: Payer: Self-pay

## 2023-11-23 ENCOUNTER — Other Ambulatory Visit (HOSPITAL_COMMUNITY): Payer: Self-pay

## 2023-11-23 ENCOUNTER — Other Ambulatory Visit: Payer: Self-pay

## 2023-11-25 ENCOUNTER — Other Ambulatory Visit: Payer: Self-pay

## 2023-11-29 ENCOUNTER — Other Ambulatory Visit (HOSPITAL_COMMUNITY): Payer: Self-pay

## 2023-11-29 MED ORDER — TRAMADOL HCL 50 MG PO TABS
50.0000 mg | ORAL_TABLET | Freq: Four times a day (QID) | ORAL | 0 refills | Status: DC | PRN
Start: 2023-11-29 — End: 2024-05-23
  Filled 2023-11-29: qty 30, 8d supply, fill #0

## 2023-12-01 ENCOUNTER — Other Ambulatory Visit: Payer: Self-pay

## 2023-12-01 ENCOUNTER — Other Ambulatory Visit (HOSPITAL_COMMUNITY): Payer: Self-pay

## 2023-12-01 MED ORDER — OLMESARTAN MEDOXOMIL 40 MG PO TABS
40.0000 mg | ORAL_TABLET | Freq: Every day | ORAL | 2 refills | Status: DC
  Filled 2023-12-01: qty 30, 30d supply, fill #0
  Filled 2023-12-28: qty 30, 30d supply, fill #1
  Filled 2024-01-26: qty 30, 30d supply, fill #2

## 2023-12-06 DIAGNOSIS — M1711 Unilateral primary osteoarthritis, right knee: Secondary | ICD-10-CM | POA: Diagnosis not present

## 2023-12-07 ENCOUNTER — Other Ambulatory Visit: Payer: Self-pay

## 2023-12-07 ENCOUNTER — Other Ambulatory Visit (HOSPITAL_COMMUNITY): Payer: Self-pay

## 2023-12-13 ENCOUNTER — Other Ambulatory Visit: Payer: Self-pay

## 2023-12-13 ENCOUNTER — Other Ambulatory Visit (HOSPITAL_COMMUNITY): Payer: Self-pay

## 2023-12-13 DIAGNOSIS — M1711 Unilateral primary osteoarthritis, right knee: Secondary | ICD-10-CM | POA: Diagnosis not present

## 2023-12-13 MED ORDER — GLIPIZIDE 10 MG PO TABS
10.0000 mg | ORAL_TABLET | Freq: Every day | ORAL | 0 refills | Status: DC
  Filled 2023-12-13: qty 30, 30d supply, fill #0

## 2023-12-28 ENCOUNTER — Other Ambulatory Visit: Payer: Self-pay

## 2023-12-28 ENCOUNTER — Other Ambulatory Visit (HOSPITAL_COMMUNITY): Payer: Self-pay

## 2024-01-05 ENCOUNTER — Other Ambulatory Visit: Payer: Self-pay

## 2024-01-05 ENCOUNTER — Other Ambulatory Visit (HOSPITAL_COMMUNITY): Payer: Self-pay

## 2024-01-05 MED ORDER — FLUCONAZOLE 100 MG PO TABS
100.0000 mg | ORAL_TABLET | Freq: Every day | ORAL | 1 refills | Status: DC
  Filled 2024-01-05: qty 10, 10d supply, fill #0
  Filled 2024-01-26: qty 10, 10d supply, fill #1

## 2024-01-05 MED ORDER — LANTUS SOLOSTAR 100 UNIT/ML ~~LOC~~ SOPN
20.0000 [IU] | PEN_INJECTOR | Freq: Every day | SUBCUTANEOUS | 11 refills | Status: DC
  Filled 2024-01-05: qty 30, 37d supply, fill #0

## 2024-01-05 MED ORDER — CLONIDINE 0.1 MG/24HR TD PTWK
0.1000 mg | MEDICATED_PATCH | TRANSDERMAL | 11 refills | Status: DC
  Filled 2024-01-05: qty 4, 28d supply, fill #0
  Filled 2024-02-03: qty 4, 28d supply, fill #1
  Filled 2024-04-23: qty 4, 28d supply, fill #2
  Filled 2024-07-05: qty 4, 28d supply, fill #3

## 2024-01-06 ENCOUNTER — Other Ambulatory Visit: Payer: Self-pay

## 2024-01-06 ENCOUNTER — Other Ambulatory Visit (HOSPITAL_COMMUNITY): Payer: Self-pay

## 2024-01-06 MED ORDER — GLIPIZIDE 10 MG PO TABS
10.0000 mg | ORAL_TABLET | Freq: Every day | ORAL | 3 refills | Status: DC
  Filled 2024-01-06: qty 30, 30d supply, fill #0
  Filled 2024-02-07: qty 30, 30d supply, fill #1
  Filled 2024-03-22 – 2024-03-27 (×2): qty 30, 30d supply, fill #2
  Filled 2024-04-23: qty 30, 30d supply, fill #3

## 2024-01-07 ENCOUNTER — Other Ambulatory Visit: Payer: Self-pay

## 2024-01-10 ENCOUNTER — Other Ambulatory Visit (HOSPITAL_COMMUNITY): Payer: Self-pay

## 2024-01-13 ENCOUNTER — Other Ambulatory Visit: Payer: Self-pay

## 2024-01-26 ENCOUNTER — Other Ambulatory Visit (HOSPITAL_COMMUNITY): Payer: Self-pay

## 2024-01-28 ENCOUNTER — Other Ambulatory Visit: Payer: Self-pay

## 2024-01-30 ENCOUNTER — Other Ambulatory Visit (HOSPITAL_COMMUNITY): Payer: Self-pay

## 2024-02-01 DIAGNOSIS — E1165 Type 2 diabetes mellitus with hyperglycemia: Secondary | ICD-10-CM | POA: Diagnosis not present

## 2024-02-02 ENCOUNTER — Other Ambulatory Visit (HOSPITAL_COMMUNITY): Payer: Self-pay

## 2024-02-02 ENCOUNTER — Other Ambulatory Visit (HOSPITAL_BASED_OUTPATIENT_CLINIC_OR_DEPARTMENT_OTHER): Payer: Self-pay

## 2024-02-03 ENCOUNTER — Other Ambulatory Visit: Payer: Self-pay

## 2024-02-03 ENCOUNTER — Other Ambulatory Visit (HOSPITAL_COMMUNITY): Payer: Self-pay

## 2024-02-03 MED ORDER — GLIMEPIRIDE 4 MG PO TABS
8.0000 mg | ORAL_TABLET | Freq: Every day | ORAL | 1 refills | Status: AC
  Filled 2024-02-03: qty 180, 90d supply, fill #0

## 2024-02-04 ENCOUNTER — Other Ambulatory Visit (HOSPITAL_COMMUNITY): Payer: Self-pay

## 2024-02-07 ENCOUNTER — Other Ambulatory Visit (HOSPITAL_COMMUNITY): Payer: Self-pay

## 2024-02-07 ENCOUNTER — Other Ambulatory Visit: Payer: Self-pay

## 2024-02-07 MED ORDER — JANUVIA 100 MG PO TABS
100.0000 mg | ORAL_TABLET | Freq: Every day | ORAL | 2 refills | Status: DC
  Filled 2024-02-07: qty 30, 30d supply, fill #0
  Filled 2024-03-07: qty 30, 30d supply, fill #1
  Filled 2024-04-06: qty 30, 30d supply, fill #2

## 2024-02-11 ENCOUNTER — Other Ambulatory Visit (HOSPITAL_COMMUNITY): Payer: Self-pay

## 2024-02-11 MED ORDER — METRONIDAZOLE 500 MG PO TABS
500.0000 mg | ORAL_TABLET | Freq: Three times a day (TID) | ORAL | 1 refills | Status: DC
  Filled 2024-02-11: qty 21, 7d supply, fill #0

## 2024-02-11 MED ORDER — CIPROFLOXACIN HCL 500 MG PO TABS
500.0000 mg | ORAL_TABLET | Freq: Two times a day (BID) | ORAL | 1 refills | Status: DC
Start: 2024-02-11 — End: 2024-02-11
  Filled 2024-02-11: qty 14, 7d supply, fill #0

## 2024-02-14 DIAGNOSIS — H25813 Combined forms of age-related cataract, bilateral: Secondary | ICD-10-CM | POA: Diagnosis not present

## 2024-02-14 DIAGNOSIS — H5213 Myopia, bilateral: Secondary | ICD-10-CM | POA: Diagnosis not present

## 2024-02-14 DIAGNOSIS — E119 Type 2 diabetes mellitus without complications: Secondary | ICD-10-CM | POA: Diagnosis not present

## 2024-02-14 DIAGNOSIS — H353131 Nonexudative age-related macular degeneration, bilateral, early dry stage: Secondary | ICD-10-CM | POA: Diagnosis not present

## 2024-02-14 DIAGNOSIS — H524 Presbyopia: Secondary | ICD-10-CM | POA: Diagnosis not present

## 2024-02-21 ENCOUNTER — Other Ambulatory Visit (HOSPITAL_COMMUNITY): Payer: Self-pay

## 2024-02-21 DIAGNOSIS — E1165 Type 2 diabetes mellitus with hyperglycemia: Secondary | ICD-10-CM | POA: Diagnosis not present

## 2024-02-21 DIAGNOSIS — I10 Essential (primary) hypertension: Secondary | ICD-10-CM | POA: Diagnosis not present

## 2024-02-21 DIAGNOSIS — E785 Hyperlipidemia, unspecified: Secondary | ICD-10-CM | POA: Diagnosis not present

## 2024-02-21 MED ORDER — GLIPIZIDE 10 MG PO TABS
10.0000 mg | ORAL_TABLET | Freq: Two times a day (BID) | ORAL | 5 refills | Status: DC
  Filled 2024-02-21 – 2024-02-24 (×2): qty 60, 30d supply, fill #0
  Filled 2024-04-18 – 2024-05-07 (×4): qty 60, 30d supply, fill #1
  Filled 2024-06-11: qty 60, 30d supply, fill #2
  Filled 2024-07-28 – 2024-08-02 (×2): qty 60, 30d supply, fill #3
  Filled 2024-08-27 – 2024-08-30 (×2): qty 60, 30d supply, fill #4
  Filled 2024-09-26 – 2024-10-25 (×5): qty 60, 30d supply, fill #5

## 2024-02-21 MED ORDER — METFORMIN HCL 500 MG PO TABS
500.0000 mg | ORAL_TABLET | Freq: Two times a day (BID) | ORAL | 5 refills | Status: DC
  Filled 2024-02-21: qty 60, 30d supply, fill #0
  Filled 2024-05-07: qty 60, 30d supply, fill #1
  Filled 2024-07-17: qty 60, 30d supply, fill #2
  Filled 2024-08-09 – 2024-08-10 (×2): qty 60, 30d supply, fill #3

## 2024-02-22 ENCOUNTER — Other Ambulatory Visit: Payer: Self-pay

## 2024-02-22 ENCOUNTER — Other Ambulatory Visit (HOSPITAL_COMMUNITY): Payer: Self-pay

## 2024-02-23 ENCOUNTER — Other Ambulatory Visit (HOSPITAL_COMMUNITY): Payer: Self-pay

## 2024-02-24 ENCOUNTER — Other Ambulatory Visit: Payer: Self-pay

## 2024-02-24 ENCOUNTER — Other Ambulatory Visit (HOSPITAL_COMMUNITY): Payer: Self-pay

## 2024-02-24 MED ORDER — OLMESARTAN MEDOXOMIL 40 MG PO TABS
40.0000 mg | ORAL_TABLET | Freq: Every day | ORAL | 5 refills | Status: DC
  Filled 2024-02-24: qty 30, 30d supply, fill #0
  Filled 2024-03-22: qty 30, 30d supply, fill #1
  Filled 2024-04-23: qty 30, 30d supply, fill #2
  Filled 2024-05-19: qty 30, 30d supply, fill #3
  Filled 2024-06-18: qty 30, 30d supply, fill #4
  Filled 2024-07-18: qty 30, 30d supply, fill #5

## 2024-02-24 MED ORDER — METOPROLOL SUCCINATE ER 100 MG PO TB24
100.0000 mg | ORAL_TABLET | Freq: Every day | ORAL | 0 refills | Status: DC
  Filled 2024-02-24: qty 90, 90d supply, fill #0

## 2024-02-24 MED ORDER — METFORMIN HCL ER 500 MG PO TB24
500.0000 mg | ORAL_TABLET | Freq: Two times a day (BID) | ORAL | 0 refills | Status: AC
  Filled 2024-02-24: qty 180, 90d supply, fill #0

## 2024-03-01 ENCOUNTER — Other Ambulatory Visit (HOSPITAL_COMMUNITY): Payer: Self-pay

## 2024-03-01 ENCOUNTER — Other Ambulatory Visit: Payer: Self-pay

## 2024-03-01 MED ORDER — FLUTICASONE PROPIONATE 50 MCG/ACT NA SUSP
2.0000 | Freq: Every evening | NASAL | 6 refills | Status: AC
  Filled 2024-03-01: qty 16, 30d supply, fill #0
  Filled 2024-04-03: qty 16, 30d supply, fill #1
  Filled 2024-05-14 – 2024-05-23 (×2): qty 16, 30d supply, fill #2
  Filled 2024-07-17: qty 16, 30d supply, fill #3

## 2024-03-03 DIAGNOSIS — M1711 Unilateral primary osteoarthritis, right knee: Secondary | ICD-10-CM | POA: Diagnosis not present

## 2024-03-07 ENCOUNTER — Other Ambulatory Visit (HOSPITAL_COMMUNITY): Payer: Self-pay

## 2024-03-10 ENCOUNTER — Other Ambulatory Visit (HOSPITAL_COMMUNITY)

## 2024-03-17 DIAGNOSIS — M170 Bilateral primary osteoarthritis of knee: Secondary | ICD-10-CM | POA: Diagnosis not present

## 2024-03-22 ENCOUNTER — Other Ambulatory Visit (HOSPITAL_COMMUNITY): Payer: Self-pay

## 2024-03-23 ENCOUNTER — Other Ambulatory Visit (HOSPITAL_COMMUNITY): Payer: Self-pay

## 2024-03-23 DIAGNOSIS — Z6841 Body Mass Index (BMI) 40.0 and over, adult: Secondary | ICD-10-CM | POA: Diagnosis not present

## 2024-03-23 DIAGNOSIS — Z1211 Encounter for screening for malignant neoplasm of colon: Secondary | ICD-10-CM | POA: Diagnosis not present

## 2024-03-23 DIAGNOSIS — N951 Menopausal and female climacteric states: Secondary | ICD-10-CM | POA: Diagnosis not present

## 2024-03-23 DIAGNOSIS — Z8542 Personal history of malignant neoplasm of other parts of uterus: Secondary | ICD-10-CM | POA: Diagnosis not present

## 2024-03-23 DIAGNOSIS — Z01419 Encounter for gynecological examination (general) (routine) without abnormal findings: Secondary | ICD-10-CM | POA: Diagnosis not present

## 2024-03-23 MED ORDER — PREMARIN 0.625 MG PO TABS
0.6250 mg | ORAL_TABLET | Freq: Every day | ORAL | 11 refills | Status: AC
  Filled 2024-03-23 – 2024-03-25 (×2): qty 30, 30d supply, fill #0
  Filled 2024-04-23: qty 30, 30d supply, fill #1
  Filled 2024-05-23: qty 30, 30d supply, fill #2
  Filled 2024-06-26: qty 30, 30d supply, fill #3
  Filled 2024-07-28: qty 30, 30d supply, fill #4
  Filled 2024-08-27: qty 30, 30d supply, fill #5
  Filled 2024-09-26: qty 30, 30d supply, fill #6
  Filled 2024-10-25 (×2): qty 30, 30d supply, fill #7
  Filled 2024-11-27: qty 30, 30d supply, fill #8
  Filled 2024-12-25: qty 30, 30d supply, fill #9

## 2024-03-24 ENCOUNTER — Other Ambulatory Visit (HOSPITAL_COMMUNITY): Payer: Self-pay

## 2024-03-25 ENCOUNTER — Other Ambulatory Visit (HOSPITAL_BASED_OUTPATIENT_CLINIC_OR_DEPARTMENT_OTHER): Payer: Self-pay

## 2024-03-25 ENCOUNTER — Other Ambulatory Visit (HOSPITAL_COMMUNITY): Payer: Self-pay

## 2024-03-30 ENCOUNTER — Other Ambulatory Visit (HOSPITAL_COMMUNITY): Payer: Self-pay

## 2024-03-30 MED ORDER — LEVOCETIRIZINE DIHYDROCHLORIDE 5 MG PO TABS
5.0000 mg | ORAL_TABLET | Freq: Every day | ORAL | 2 refills | Status: DC
  Filled 2024-03-30 – 2024-04-04 (×2): qty 90, 90d supply, fill #0

## 2024-03-30 MED ORDER — ALBUTEROL SULFATE HFA 108 (90 BASE) MCG/ACT IN AERS
1.0000 | INHALATION_SPRAY | RESPIRATORY_TRACT | 2 refills | Status: AC | PRN
  Filled 2024-03-30: qty 6.7, 17d supply, fill #0

## 2024-04-03 ENCOUNTER — Encounter (HOSPITAL_COMMUNITY): Payer: Self-pay

## 2024-04-03 ENCOUNTER — Encounter (HOSPITAL_COMMUNITY)
Admission: RE | Admit: 2024-04-03 | Discharge: 2024-04-03 | Disposition: A | Discharge status: Home / Self Care | Source: Ambulatory Visit | Attending: Ophthalmology | Admitting: Ophthalmology

## 2024-04-03 ENCOUNTER — Other Ambulatory Visit: Payer: Self-pay

## 2024-04-03 DIAGNOSIS — H25811 Combined forms of age-related cataract, right eye: Secondary | ICD-10-CM | POA: Diagnosis not present

## 2024-04-04 ENCOUNTER — Other Ambulatory Visit: Payer: Self-pay

## 2024-04-04 ENCOUNTER — Other Ambulatory Visit (HOSPITAL_COMMUNITY): Payer: Self-pay

## 2024-04-04 NOTE — H&P (Signed)
 Surgical History & Physical  Patient Name: [name]  DOB: 18-Jun-1961  Surgery: Cataract extraction with intraocular lens implant phacoemulsification; Right Eye Surgeon: Tarri Farm MD Surgery Date: 04/10/2024 Pre-Op Date: 02/14/2024  HPI: A 50 Yr. old female patient 1. The patient is returning for an OCT macula, non-dilated exam 6 months follow-up. Since the last visit, the affected area is doing well. The patient's vision is blurry. The condition's severity is constant. The complaint is associated with difficulty reading small print on medicine bottles/labels, difficulty writing checks/filling out forms, difficulty reading captions on tv. This is negatively affecting the patient's quality of life and the patient is unable to function adequately in life with the current level of vision. HPI Completed by Dr. Tarri Farm  Medical History: Macula Degeneration Cataracts  Diabetes - DM Type 2 High Blood Pressure  Review of Systems Negative Allergic/Immunologic Hypertension Cardiovascular Negative Constitutional Negative Ear, Nose, Mouth & Throat Diabetes Endocrine Negative Eyes Negative Gastrointestinal Negative Genitourinary Negative Hemotologic/Lymphatic Negative Integumentary Negative Musculoskeletal Negative Neurological Negative Psychiatry Negative Respiratory  Social Never smoked  Medication Pataday, Systane AT,  amlodipine  ,  montelukast  ,  albuterol  sulfate ,  potassium chloride  ,  fluticasone  propionate ,  metformin  (Glucophage  XR) ,  glipizide  ,  metoprolol  succinate ,  pantoprazole  ,  olmesartan  ,  Premarin  ,  Januvia  ,  hydrochlorothiazide    Sx/Procedures Gall Bladder sx, Hysterectomy, Multiple wrist and hand sx, Sinus Sx, C Section  Drug Allergies  statin ,  sulfa ,  berham sulfate   History & Physical: Heent: cataracts NECK: supple without bruits LUNGS: lungs clear to auscultation CV: regular rate and rhythm Abdomen: soft and  non-tender  Impression & Plan: Assessment: 1.  COMBINED FORMS AGE RELATED CATARACT; Both Eyes (H25.813) 2.  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  Diabetes Type 2 No retinopathy (E11.9) 4.  Myopia ; Both Eyes (H52.13) 5.  Presbyopia ; Both Eyes (H52.4)  Plan: 1.  Cataract accounts for the patient's decreased vision. This visual impairment is not correctable with a tolerable change in glasses or contact lenses. Cataract surgery with an implantation of a new lens should significantly improve the visual and functional status of the patient. Discussed all risks, benefits, alternatives, and potential complications. Discussed the procedures and recovery. Patient desires to have surgery. A-scan ordered and performed today for intra-ocular lens calculations. The surgery will be performed in order to improve vision for driving, reading, and for eye examinations. Recommend phacoemulsification with intra-ocular lens. Recommend Dextenza  for post-operative pain and inflammation. Right Eye worse - first. Dilates well - shugarcaine by protocol. Myopic - reader - set goal -2.50.  2.  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 AREDS 2 vitamins. Monitor. OCT macula shows mild drusen 02/13/24  3.  Stressed importance of blood sugar and blood pressure control, and also yearly eye examinations. Discussed the need for ongoing proactive ocular exams and treatment, hopefully before visual symptoms develop.  4.  Explained near-sightedness to patient. Gave new glasses Rx.  5.  Explained presbyopia to patient. Educational handout given.

## 2024-04-06 ENCOUNTER — Other Ambulatory Visit: Payer: Self-pay

## 2024-04-06 ENCOUNTER — Other Ambulatory Visit (HOSPITAL_COMMUNITY): Payer: Self-pay

## 2024-04-07 ENCOUNTER — Other Ambulatory Visit (HOSPITAL_COMMUNITY): Payer: Self-pay

## 2024-04-07 ENCOUNTER — Other Ambulatory Visit: Payer: Self-pay

## 2024-04-07 MED ORDER — MIRABEGRON ER 25 MG PO TB24
25.0000 mg | ORAL_TABLET | Freq: Every day | ORAL | 4 refills | Status: DC
Start: 2024-04-06 — End: 2024-07-06
  Filled 2024-04-07: qty 30, 30d supply, fill #0
  Filled 2024-05-07: qty 30, 30d supply, fill #1
  Filled 2024-06-14: qty 30, 30d supply, fill #2

## 2024-04-10 ENCOUNTER — Other Ambulatory Visit: Payer: Self-pay

## 2024-04-10 ENCOUNTER — Ambulatory Visit (HOSPITAL_COMMUNITY): Admitting: Anesthesiology

## 2024-04-10 ENCOUNTER — Ambulatory Visit (HOSPITAL_COMMUNITY)
Admission: RE | Admit: 2024-04-10 | Discharge: 2024-04-10 | Disposition: A | Discharge status: Home / Self Care | Attending: Ophthalmology | Admitting: Ophthalmology

## 2024-04-10 ENCOUNTER — Encounter (HOSPITAL_COMMUNITY)
Admission: RE | Disposition: A | Discharge status: Home / Self Care | Payer: Self-pay | Source: Home / Self Care | Attending: Ophthalmology

## 2024-04-10 ENCOUNTER — Encounter (HOSPITAL_COMMUNITY): Payer: Self-pay | Admitting: Ophthalmology

## 2024-04-10 DIAGNOSIS — E66813 Obesity, class 3: Secondary | ICD-10-CM | POA: Insufficient documentation

## 2024-04-10 DIAGNOSIS — Z87891 Personal history of nicotine dependence: Secondary | ICD-10-CM

## 2024-04-10 DIAGNOSIS — J45909 Unspecified asthma, uncomplicated: Secondary | ICD-10-CM

## 2024-04-10 DIAGNOSIS — H25811 Combined forms of age-related cataract, right eye: Secondary | ICD-10-CM

## 2024-04-10 DIAGNOSIS — Z6841 Body Mass Index (BMI) 40.0 and over, adult: Secondary | ICD-10-CM | POA: Diagnosis not present

## 2024-04-10 DIAGNOSIS — H25813 Combined forms of age-related cataract, bilateral: Secondary | ICD-10-CM | POA: Diagnosis not present

## 2024-04-10 DIAGNOSIS — H524 Presbyopia: Secondary | ICD-10-CM | POA: Insufficient documentation

## 2024-04-10 DIAGNOSIS — I10 Essential (primary) hypertension: Secondary | ICD-10-CM | POA: Diagnosis not present

## 2024-04-10 DIAGNOSIS — H5213 Myopia, bilateral: Secondary | ICD-10-CM | POA: Insufficient documentation

## 2024-04-10 DIAGNOSIS — E0590 Thyrotoxicosis, unspecified without thyrotoxic crisis or storm: Secondary | ICD-10-CM | POA: Insufficient documentation

## 2024-04-10 DIAGNOSIS — Z7984 Long term (current) use of oral hypoglycemic drugs: Secondary | ICD-10-CM | POA: Insufficient documentation

## 2024-04-10 DIAGNOSIS — K219 Gastro-esophageal reflux disease without esophagitis: Secondary | ICD-10-CM | POA: Insufficient documentation

## 2024-04-10 DIAGNOSIS — E119 Type 2 diabetes mellitus without complications: Secondary | ICD-10-CM

## 2024-04-10 DIAGNOSIS — E1136 Type 2 diabetes mellitus with diabetic cataract: Secondary | ICD-10-CM | POA: Insufficient documentation

## 2024-04-10 HISTORY — PX: CATARACT EXTRACTION W/PHACO: SHX586

## 2024-04-10 LAB — GLUCOSE, CAPILLARY: Glucose-Capillary: 162 mg/dL — ABNORMAL HIGH (ref 70–99)

## 2024-04-10 SURGERY — PHACOEMULSIFICATION, CATARACT, WITH IOL INSERTION
Anesthesia: Monitor Anesthesia Care | Site: Eye | Laterality: Right

## 2024-04-10 MED ORDER — TETRACAINE HCL 0.5 % OP SOLN
1.0000 [drp] | OPHTHALMIC | Status: AC | PRN
  Administered 2024-04-10 (×3): 1 [drp] via OPHTHALMIC

## 2024-04-10 MED ORDER — SODIUM HYALURONATE 10 MG/ML IO SOLUTION
PREFILLED_SYRINGE | INTRAOCULAR | Status: DC | PRN
  Administered 2024-04-10: .85 mL via INTRAOCULAR

## 2024-04-10 MED ORDER — MIDAZOLAM HCL 2 MG/2ML IJ SOLN
INTRAMUSCULAR | Status: AC
  Filled 2024-04-10: qty 2

## 2024-04-10 MED ORDER — LIDOCAINE HCL (PF) 1 % IJ SOLN
INTRAOCULAR | Status: DC | PRN
  Administered 2024-04-10: 1 mL via OPHTHALMIC

## 2024-04-10 MED ORDER — SODIUM CHLORIDE 0.9% FLUSH
3.0000 mL | Freq: Two times a day (BID) | INTRAVENOUS | Status: DC
Start: 2024-04-10 — End: 2024-04-10

## 2024-04-10 MED ORDER — PHENYLEPHRINE HCL 2.5 % OP SOLN
1.0000 [drp] | OPHTHALMIC | Status: AC | PRN
  Administered 2024-04-10 (×3): 1 [drp] via OPHTHALMIC

## 2024-04-10 MED ORDER — MIDAZOLAM HCL 5 MG/5ML IJ SOLN
INTRAMUSCULAR | Status: DC | PRN
  Administered 2024-04-10: 1 mg via INTRAVENOUS

## 2024-04-10 MED ORDER — LIDOCAINE HCL 3.5 % OP GEL
1.0000 | Freq: Once | OPHTHALMIC | Status: AC
  Administered 2024-04-10: 1 via OPHTHALMIC

## 2024-04-10 MED ORDER — SODIUM CHLORIDE 0.9% FLUSH
3.0000 mL | INTRAVENOUS | Status: DC | PRN
  Administered 2024-04-10: 3 mL via INTRAVENOUS

## 2024-04-10 MED ORDER — POVIDONE-IODINE 5 % OP SOLN
OPHTHALMIC | Status: DC | PRN
  Administered 2024-04-10: 1 via OPHTHALMIC

## 2024-04-10 MED ORDER — EPINEPHRINE PF 1 MG/ML IJ SOLN
INTRAOCULAR | Status: DC | PRN
  Administered 2024-04-10: 500 mL

## 2024-04-10 MED ORDER — MOXIFLOXACIN HCL 5 MG/ML IO SOLN
INTRAOCULAR | Status: DC | PRN
  Administered 2024-04-10: .3 mL via INTRACAMERAL

## 2024-04-10 MED ORDER — TROPICAMIDE 1 % OP SOLN
1.0000 [drp] | OPHTHALMIC | Status: AC | PRN
  Administered 2024-04-10 (×3): 1 [drp] via OPHTHALMIC

## 2024-04-10 MED ORDER — SODIUM HYALURONATE 23MG/ML IO SOSY
PREFILLED_SYRINGE | INTRAOCULAR | Status: DC | PRN
  Administered 2024-04-10: .6 mL via INTRAOCULAR

## 2024-04-10 MED ORDER — STERILE WATER FOR IRRIGATION IR SOLN
Status: DC | PRN
  Administered 2024-04-10: 1

## 2024-04-10 MED ORDER — BSS IO SOLN
INTRAOCULAR | Status: DC | PRN
  Administered 2024-04-10: 15 mL via INTRAOCULAR

## 2024-04-10 SURGICAL SUPPLY — 13 items
CATARACT SUITE SIGHTPATH (MISCELLANEOUS) ×1 IMPLANT
CLOTH BEACON ORANGE TIMEOUT ST (SAFETY) ×1 IMPLANT
EYE SHIELD UNIVERSAL CLEAR (GAUZE/BANDAGES/DRESSINGS) IMPLANT
FEE CATARACT SUITE SIGHTPATH (MISCELLANEOUS) ×1 IMPLANT
GLOVE BIOGEL PI IND STRL 7.0 (GLOVE) ×2 IMPLANT
GOWN STRL REUS W/TWL LRG LVL3 (GOWN DISPOSABLE) IMPLANT
LENS IOL TECNIS EYHANCE 25.0 (Intraocular Lens) IMPLANT
NDL HYPO 18GX1.5 BLUNT FILL (NEEDLE) ×1 IMPLANT
NEEDLE HYPO 18GX1.5 BLUNT FILL (NEEDLE) ×1 IMPLANT
PAD ARMBOARD POSITIONER FOAM (MISCELLANEOUS) ×1 IMPLANT
SYR TB 1ML LL NO SAFETY (SYRINGE) ×1 IMPLANT
TAPE SURG TRANSPORE 1 IN (GAUZE/BANDAGES/DRESSINGS) IMPLANT
WATER STERILE IRR 250ML POUR (IV SOLUTION) ×1 IMPLANT

## 2024-04-10 NOTE — Anesthesia Postprocedure Evaluation (Signed)
 Anesthesia Post Note  Patient: DAZIAH SCHOENBORN  Procedure(s) Performed: PHACOEMULSIFICATION, CATARACT, WITH IOL INSERTION (Right: Eye)  Patient location during evaluation: PACU Anesthesia Type: MAC Level of consciousness: awake and alert Pain management: pain level controlled Vital Signs Assessment: post-procedure vital signs reviewed and stable Respiratory status: spontaneous breathing, nonlabored ventilation, respiratory function stable and patient connected to nasal cannula oxygen Cardiovascular status: stable and blood pressure returned to baseline Postop Assessment: no apparent nausea or vomiting Anesthetic complications: no   There were no known notable events for this encounter.   Last Vitals:  Vitals:   04/10/24 0716 04/10/24 0831  BP: (!) 145/65 (!) 144/68  Pulse: 62 73  Resp:  18  Temp: 36.5 C 36.9 C  SpO2: 99% 100%    Last Pain:  Vitals:   04/10/24 0831  TempSrc: Oral  PainSc: 2                  Sandy Crumb

## 2024-04-10 NOTE — Op Note (Signed)
 Date of procedure: 04/10/24  Pre-operative diagnosis:  Visually significant combined form age-related cataract, Right Eye (H25.811)  Post-operative diagnosis:  Visually significant combined form age-related cataract, Right Eye (H25.811)  Procedure: Removal of cataract via phacoemulsification and insertion of intra-ocular lens [employer] DIB00 +25.0D into the capsular bag of the Right Eye  Attending surgeon: Pleas Brill. Reyaan Thoma, MD, MA  Anesthesia: MAC, Topical Akten  Complications: None  Estimated Blood Loss: <8mL (minimal)  Specimens: None  Implants: As above  Indications:  Visually significant age-related cataract, Right Eye  Procedure:  The patient was seen and identified in the pre-operative area. The operative eye was identified and dilated.  The operative eye was marked.  Topical anesthesia was administered to the operative eye.     The patient was then to the operative suite and placed in the supine position.  A timeout was performed confirming the patient, procedure to be performed, and all other relevant information.   The patient's face was prepped and draped in the usual fashion for intra-ocular surgery.  A lid speculum was placed into the operative eye and the surgical microscope moved into place and focused.  A superotemporal paracentesis was created using a 20 gauge paracentesis blade.  Shugarcaine was injected into the anterior chamber.  Viscoelastic was injected into the anterior chamber.  A temporal clear-corneal main wound incision was created using a 2.78mm microkeratome.  A continuous curvilinear capsulorrhexis was initiated using an irrigating cystitome and completed using capsulorrhexis forceps.  Hydrodissection and hydrodeliniation were performed.  Viscoelastic was injected into the anterior chamber.  A phacoemulsification handpiece and a chopper as a second instrument were used to remove the nucleus and epinucleus. The irrigation/aspiration handpiece was used to  remove any remaining cortical material.   The capsular bag was reinflated with viscoelastic, checked, and found to be intact.  The intraocular lens was inserted into the capsular bag.  The irrigation/aspiration handpiece was used to remove any remaining viscoelastic.  The clear corneal wound and paracentesis wounds were then hydrated and checked with Weck-Cels to be watertight. 0.1mL of Moxfloxacin was injected into the anterior chamber. The lid-speculum was removed.  The drape was removed.  The patient's face was cleaned with a wet and dry 4x4. A clear shield was taped over the eye. The patient was taken to the post-operative care unit in good condition, having tolerated the procedure well.  Post-Op Instructions: The patient will follow up at [facility] for a same day post-operative evaluation and will receive all other orders and instructions.

## 2024-04-10 NOTE — Discharge Instructions (Signed)
 Please discharge patient when stable, will follow up today with Dr. June Leap at the [facility] office immediately following discharge.  Leave shield in place until visit.  All paperwork with discharge instructions will be given at the office.  [facility] Address:  [address]  [place], [place] [address]

## 2024-04-10 NOTE — Interval H&P Note (Signed)
 History and Physical Interval Note:  04/10/2024 7:45 AM  [name]  has presented today for surgery, with the diagnosis of combined forms age related cataract, right eye.  The various methods of treatment have been discussed with the patient and family. After consideration of risks, benefits and other options for treatment, the patient has consented to  Procedure(s): PHACOEMULSIFICATION, CATARACT, WITH IOL INSERTION (Right) as a surgical intervention.  The patient's history has been reviewed, patient examined, no change in status, stable for surgery.  I have reviewed the patient's chart and labs.  Questions were answered to the patient's satisfaction.     Tarri Farm

## 2024-04-10 NOTE — Anesthesia Preprocedure Evaluation (Addendum)
 Anesthesia Evaluation  Patient identified by MRN, date of birth, ID band Patient awake    Reviewed: Allergy & Precautions, NPO status , Patient's Chart, lab work & pertinent test results  Airway Mallampati: II  TM Distance: >3 FB Neck ROM: Full    Dental  (+) Dental Advisory Given, Upper Dentures   Pulmonary asthma , former smoker   Pulmonary exam normal breath sounds clear to auscultation       Cardiovascular hypertension, Pt. on medications Normal cardiovascular exam Rhythm:Regular Rate:Normal     Neuro/Psych   Anxiety     negative neurological ROS  negative psych ROS   GI/Hepatic Neg liver ROS,GERD  ,,  Endo/Other  diabetes, Type 2 Hyperthyroidism Class 3 obesity  Renal/GU negative Renal ROS  negative genitourinary   Musculoskeletal negative musculoskeletal ROS (+)    Abdominal  (+) + obese  Peds negative pediatric ROS (+)  Hematology negative hematology ROS (+)   Anesthesia Other Findings   Reproductive/Obstetrics negative OB ROS                             Anesthesia Physical Anesthesia Plan  ASA: 3  Anesthesia Plan: MAC   Post-op Pain Management: Minimal or no pain anticipated   Induction: Intravenous  PONV Risk Score and Plan:   Airway Management Planned: Nasal Cannula and Natural Airway  Additional Equipment:   Intra-op Plan:   Post-operative Plan:   Informed Consent: I have reviewed the patients History and Physical, chart, labs and discussed the procedure including the risks, benefits and alternatives for the proposed anesthesia with the patient or authorized representative who has indicated his/her understanding and acceptance.     Dental advisory given  Plan Discussed with: CRNA  Anesthesia Plan Comments:         Anesthesia Quick Evaluation

## 2024-04-10 NOTE — Transfer of Care (Signed)
 Immediate Anesthesia Transfer of Care Note  Patient: [name]  Procedure(s) Performed: PHACOEMULSIFICATION, CATARACT, WITH IOL INSERTION (Right: Eye)  Patient Location: PACU  Anesthesia Type:MAC  Level of Consciousness: awake, alert , and oriented  Airway & Oxygen Therapy: Patient Spontanous Breathing  Post-op Assessment: Report given to RN and Post -op Vital signs reviewed and stable  Post vital signs: Reviewed and stable  Last Vitals:  Vitals Value Taken Time  BP    Temp    Pulse    Resp    SpO2      Last Pain:  Vitals:   04/10/24 0716  TempSrc: Oral  PainSc: 0-No pain         Complications: No notable events documented.

## 2024-04-11 ENCOUNTER — Encounter (HOSPITAL_COMMUNITY): Payer: Self-pay | Admitting: Ophthalmology

## 2024-04-14 DIAGNOSIS — J309 Allergic rhinitis, unspecified: Secondary | ICD-10-CM | POA: Diagnosis not present

## 2024-04-15 ENCOUNTER — Other Ambulatory Visit (HOSPITAL_COMMUNITY): Payer: Self-pay

## 2024-04-15 MED ORDER — LEVOCETIRIZINE DIHYDROCHLORIDE 5 MG PO TABS
5.0000 mg | ORAL_TABLET | Freq: Every evening | ORAL | 3 refills | Status: DC
  Filled 2024-04-15 – 2024-07-17 (×5): qty 90, 90d supply, fill #0

## 2024-04-15 MED ORDER — FLUTICASONE PROPIONATE 50 MCG/ACT NA SUSP
2.0000 | Freq: Every evening | NASAL | 11 refills | Status: AC
  Filled 2024-04-15 – 2024-05-04 (×2): qty 16, 30d supply, fill #0
  Filled 2024-05-29 – 2024-06-26 (×3): qty 16, 30d supply, fill #1
  Filled 2024-09-26: qty 16, 30d supply, fill #2
  Filled 2024-10-25: qty 16, 30d supply, fill #3
  Filled 2024-12-05: qty 16, 30d supply, fill #4

## 2024-04-15 MED ORDER — IPRATROPIUM BROMIDE 0.06 % NA SOLN
2.0000 | Freq: Three times a day (TID) | NASAL | 8 refills | Status: DC
  Filled 2024-04-15 (×2): qty 15, 25d supply, fill #0
  Filled 2024-05-14: qty 15, 25d supply, fill #1
  Filled 2024-06-14: qty 15, 25d supply, fill #2
  Filled 2024-07-17: qty 15, 25d supply, fill #3
  Filled 2024-09-26: qty 15, 25d supply, fill #4

## 2024-04-15 MED ORDER — ALBUTEROL SULFATE HFA 108 (90 BASE) MCG/ACT IN AERS
2.0000 | INHALATION_SPRAY | Freq: Four times a day (QID) | RESPIRATORY_TRACT | 5 refills | Status: AC | PRN
  Filled 2024-04-15: qty 6.7, 30d supply, fill #0
  Filled 2024-04-15: qty 6.7, 25d supply, fill #0

## 2024-04-17 ENCOUNTER — Other Ambulatory Visit: Payer: Self-pay

## 2024-04-18 ENCOUNTER — Other Ambulatory Visit (HOSPITAL_COMMUNITY): Payer: Self-pay

## 2024-04-19 ENCOUNTER — Encounter (HOSPITAL_COMMUNITY)

## 2024-04-20 ENCOUNTER — Other Ambulatory Visit (HOSPITAL_COMMUNITY)

## 2024-04-23 ENCOUNTER — Other Ambulatory Visit (HOSPITAL_COMMUNITY): Payer: Self-pay

## 2024-04-24 ENCOUNTER — Other Ambulatory Visit: Payer: Self-pay

## 2024-04-24 ENCOUNTER — Encounter (HOSPITAL_COMMUNITY): Admission: RE | Payer: Self-pay | Source: Home / Self Care

## 2024-04-24 ENCOUNTER — Ambulatory Visit (HOSPITAL_COMMUNITY): Admission: RE | Admit: 2024-04-24 | Source: Home / Self Care | Admitting: Ophthalmology

## 2024-04-24 SURGERY — PHACOEMULSIFICATION, CATARACT, WITH IOL INSERTION
Anesthesia: Monitor Anesthesia Care | Laterality: Right

## 2024-05-01 ENCOUNTER — Other Ambulatory Visit (HOSPITAL_COMMUNITY): Payer: Self-pay

## 2024-05-01 MED ORDER — SITAGLIPTIN PHOSPHATE 100 MG PO TABS
100.0000 mg | ORAL_TABLET | Freq: Every day | ORAL | 2 refills | Status: DC
  Filled 2024-05-01: qty 30, 30d supply, fill #0
  Filled 2024-06-22: qty 30, 30d supply, fill #1
  Filled 2024-07-17: qty 30, 30d supply, fill #2

## 2024-05-02 ENCOUNTER — Other Ambulatory Visit: Payer: Self-pay

## 2024-05-04 ENCOUNTER — Other Ambulatory Visit: Payer: Self-pay

## 2024-05-04 ENCOUNTER — Other Ambulatory Visit (HOSPITAL_COMMUNITY): Payer: Self-pay

## 2024-05-07 ENCOUNTER — Other Ambulatory Visit: Payer: Self-pay

## 2024-05-09 ENCOUNTER — Other Ambulatory Visit: Payer: Self-pay

## 2024-05-14 ENCOUNTER — Other Ambulatory Visit (HOSPITAL_COMMUNITY): Payer: Self-pay

## 2024-05-15 ENCOUNTER — Other Ambulatory Visit (HOSPITAL_COMMUNITY): Payer: Self-pay

## 2024-05-15 ENCOUNTER — Other Ambulatory Visit: Payer: Self-pay

## 2024-05-15 MED ORDER — GLIPIZIDE 10 MG PO TABS
10.0000 mg | ORAL_TABLET | Freq: Every day | ORAL | 0 refills | Status: DC
  Filled 2024-05-15 – 2024-07-07 (×6): qty 30, 30d supply, fill #0

## 2024-05-16 ENCOUNTER — Other Ambulatory Visit (HOSPITAL_COMMUNITY): Payer: Self-pay

## 2024-05-19 ENCOUNTER — Other Ambulatory Visit (HOSPITAL_COMMUNITY): Payer: Self-pay

## 2024-05-22 ENCOUNTER — Other Ambulatory Visit (HOSPITAL_COMMUNITY): Payer: Self-pay

## 2024-05-22 ENCOUNTER — Other Ambulatory Visit: Payer: Self-pay

## 2024-05-22 MED ORDER — METOPROLOL SUCCINATE ER 100 MG PO TB24
100.0000 mg | ORAL_TABLET | Freq: Every day | ORAL | 1 refills | Status: AC
  Filled 2024-05-22: qty 90, 90d supply, fill #0
  Filled 2024-08-22: qty 90, 90d supply, fill #1

## 2024-05-23 ENCOUNTER — Other Ambulatory Visit (HOSPITAL_COMMUNITY): Payer: Self-pay

## 2024-05-23 ENCOUNTER — Emergency Department (HOSPITAL_COMMUNITY)

## 2024-05-23 ENCOUNTER — Emergency Department (HOSPITAL_COMMUNITY)
Admission: EM | Admit: 2024-05-23 | Discharge: 2024-05-23 | Disposition: A | Discharge status: Home / Self Care | Attending: Emergency Medicine | Admitting: Emergency Medicine

## 2024-05-23 ENCOUNTER — Other Ambulatory Visit: Payer: Self-pay

## 2024-05-23 ENCOUNTER — Other Ambulatory Visit (HOSPITAL_COMMUNITY)
Admission: RE | Admit: 2024-05-23 | Discharge: 2024-05-23 | Disposition: A | Discharge status: Home / Self Care | Source: Ambulatory Visit | Attending: Nurse Practitioner | Admitting: Nurse Practitioner

## 2024-05-23 ENCOUNTER — Encounter (HOSPITAL_COMMUNITY): Payer: Self-pay

## 2024-05-23 DIAGNOSIS — M5442 Lumbago with sciatica, left side: Secondary | ICD-10-CM | POA: Insufficient documentation

## 2024-05-23 DIAGNOSIS — K5730 Diverticulosis of large intestine without perforation or abscess without bleeding: Secondary | ICD-10-CM | POA: Diagnosis not present

## 2024-05-23 DIAGNOSIS — E1165 Type 2 diabetes mellitus with hyperglycemia: Secondary | ICD-10-CM | POA: Diagnosis present

## 2024-05-23 DIAGNOSIS — R109 Unspecified abdominal pain: Secondary | ICD-10-CM | POA: Diagnosis not present

## 2024-05-23 DIAGNOSIS — R110 Nausea: Secondary | ICD-10-CM | POA: Insufficient documentation

## 2024-05-23 DIAGNOSIS — J45909 Unspecified asthma, uncomplicated: Secondary | ICD-10-CM | POA: Insufficient documentation

## 2024-05-23 DIAGNOSIS — Z7982 Long term (current) use of aspirin: Secondary | ICD-10-CM | POA: Diagnosis not present

## 2024-05-23 DIAGNOSIS — Z7984 Long term (current) use of oral hypoglycemic drugs: Secondary | ICD-10-CM | POA: Insufficient documentation

## 2024-05-23 DIAGNOSIS — R350 Frequency of micturition: Secondary | ICD-10-CM | POA: Insufficient documentation

## 2024-05-23 DIAGNOSIS — M5432 Sciatica, left side: Secondary | ICD-10-CM

## 2024-05-23 DIAGNOSIS — M4316 Spondylolisthesis, lumbar region: Secondary | ICD-10-CM | POA: Diagnosis not present

## 2024-05-23 DIAGNOSIS — Z794 Long term (current) use of insulin: Secondary | ICD-10-CM | POA: Diagnosis not present

## 2024-05-23 DIAGNOSIS — Z79899 Other long term (current) drug therapy: Secondary | ICD-10-CM | POA: Insufficient documentation

## 2024-05-23 DIAGNOSIS — E119 Type 2 diabetes mellitus without complications: Secondary | ICD-10-CM | POA: Insufficient documentation

## 2024-05-23 DIAGNOSIS — I10 Essential (primary) hypertension: Secondary | ICD-10-CM | POA: Insufficient documentation

## 2024-05-23 DIAGNOSIS — Z7951 Long term (current) use of inhaled steroids: Secondary | ICD-10-CM | POA: Insufficient documentation

## 2024-05-23 DIAGNOSIS — M5136 Other intervertebral disc degeneration, lumbar region with discogenic back pain only: Secondary | ICD-10-CM | POA: Diagnosis not present

## 2024-05-23 DIAGNOSIS — M48061 Spinal stenosis, lumbar region without neurogenic claudication: Secondary | ICD-10-CM | POA: Diagnosis not present

## 2024-05-23 LAB — COMPREHENSIVE METABOLIC PANEL WITH GFR
ALT: 19 U/L (ref 0–44)
AST: 22 U/L (ref 15–41)
Albumin: 3.2 g/dL — ABNORMAL LOW (ref 3.5–5.0)
Alkaline Phosphatase: 37 U/L — ABNORMAL LOW (ref 38–126)
Anion gap: 11 (ref 5–15)
BUN: 12 mg/dL (ref 8–23)
CO2: 24 mmol/L (ref 22–32)
Calcium: 8.8 mg/dL — ABNORMAL LOW (ref 8.9–10.3)
Chloride: 100 mmol/L (ref 98–111)
Creatinine, Ser: 0.45 mg/dL (ref 0.44–1.00)
GFR, Estimated: >60 mL/min (ref >60)
Glucose, Bld: 117 mg/dL — ABNORMAL HIGH (ref 70–99)
Potassium: 3.6 mmol/L (ref 3.5–5.1)
Sodium: 135 mmol/L (ref 135–145)
Total Bilirubin: 0.2 mg/dL (ref 0.0–1.2)
Total Protein: 6.7 g/dL (ref 6.5–8.1)

## 2024-05-23 LAB — CBC
HCT: 40 % (ref 36.0–46.0)
Hemoglobin: 13.6 g/dL (ref 12.0–15.0)
MCH: 30.4 pg (ref 26.0–34.0)
MCHC: 34 g/dL (ref 30.0–36.0)
MCV: 89.5 fL (ref 80.0–100.0)
Platelets: 292 10*3/uL (ref 150–400)
RBC: 4.47 MIL/uL (ref 3.87–5.11)
RDW: 13.7 % (ref 11.5–15.5)
WBC: 10 10*3/uL (ref 4.0–10.5)
nRBC: 0 % (ref 0.0–0.2)

## 2024-05-23 LAB — URINALYSIS, ROUTINE W REFLEX MICROSCOPIC
Bilirubin Urine: NEGATIVE
Glucose, UA: NEGATIVE mg/dL
Hgb urine dipstick: NEGATIVE
Ketones, ur: NEGATIVE mg/dL
Leukocytes,Ua: NEGATIVE
Nitrite: NEGATIVE
Protein, ur: 30 mg/dL — AB
Specific Gravity, Urine: 1.006 (ref 1.005–1.030)
pH: 7 (ref 5.0–8.0)

## 2024-05-23 LAB — BASIC METABOLIC PANEL WITH GFR
Anion gap: 11 (ref 5–15)
BUN: 11 mg/dL (ref 8–23)
CO2: 24 mmol/L (ref 22–32)
Calcium: 9.1 mg/dL (ref 8.9–10.3)
Chloride: 102 mmol/L (ref 98–111)
Creatinine, Ser: 0.51 mg/dL (ref 0.44–1.00)
GFR, Estimated: >60 mL/min (ref >60)
Glucose, Bld: 207 mg/dL — ABNORMAL HIGH (ref 70–99)
Potassium: 3.5 mmol/L (ref 3.5–5.1)
Sodium: 137 mmol/L (ref 135–145)

## 2024-05-23 LAB — HEMOGLOBIN A1C
Hgb A1c MFr Bld: 8.3 % — ABNORMAL HIGH (ref 4.8–5.6)
Mean Plasma Glucose: 192 mg/dL

## 2024-05-23 MED ORDER — DIFLUPREDNATE 0.05 % OP EMUL
1.0000 [drp] | Freq: Four times a day (QID) | OPHTHALMIC | 1 refills | Status: DC
  Filled 2024-05-23: qty 5, 25d supply, fill #0
  Filled 2024-06-14: qty 5, 25d supply, fill #1

## 2024-05-23 MED ORDER — METHOCARBAMOL 500 MG PO TABS
500.0000 mg | ORAL_TABLET | Freq: Once | ORAL | Status: AC
  Administered 2024-05-23: 500 mg via ORAL
  Filled 2024-05-23: qty 1

## 2024-05-23 MED ORDER — ALPRAZOLAM 0.5 MG PO TABS
0.5000 mg | ORAL_TABLET | Freq: Two times a day (BID) | ORAL | 0 refills | Status: DC
  Filled 2024-05-23: qty 180, 90d supply, fill #0

## 2024-05-23 MED ORDER — MORPHINE SULFATE (PF) 4 MG/ML IV SOLN
4.0000 mg | Freq: Once | INTRAVENOUS | Status: AC
  Administered 2024-05-23: 4 mg via INTRAVENOUS
  Filled 2024-05-23: qty 1

## 2024-05-23 MED ORDER — METHOCARBAMOL 500 MG PO TABS: 500.0000 mg | ORAL_TABLET | Freq: Three times a day (TID) | ORAL | 0 refills | Status: DC | PRN

## 2024-05-23 MED ORDER — ONDANSETRON HCL 4 MG/2ML IJ SOLN
4.0000 mg | Freq: Once | INTRAMUSCULAR | Status: AC
  Administered 2024-05-23: 4 mg via INTRAVENOUS
  Filled 2024-05-23: qty 2

## 2024-05-23 MED ORDER — TRAMADOL HCL 50 MG PO TABS: 50.0000 mg | ORAL_TABLET | Freq: Four times a day (QID) | ORAL | 0 refills | Status: AC | PRN

## 2024-05-23 MED ORDER — KETOROLAC TROMETHAMINE 30 MG/ML IJ SOLN
15.0000 mg | Freq: Once | INTRAMUSCULAR | Status: AC
  Administered 2024-05-23: 15 mg via INTRAVENOUS
  Filled 2024-05-23: qty 1

## 2024-05-23 NOTE — Discharge Instructions (Signed)
 Do not drive within 4 hours of taking tramadol  as this will make you drowsy.  Avoid lifting,  Bending,  Twisting or any other activity that worsens your pain over the next week.  Apply a heating pad to your lower back 20 minutes 3-4 times daily for the next several days.  You should get rechecked if your symptoms are not better over the next 5 days,  Or you develop increased pain,  Weakness in your leg(s) or loss of bladder or bowel function - these can be symptoms of a worsening problem.

## 2024-05-23 NOTE — ED Triage Notes (Signed)
 Pt c/o left sided flank pain, nausea and urinary frequency x3 days.

## 2024-05-24 LAB — MICROALBUMIN / CREATININE URINE RATIO
Creatinine, Urine: 38.4 mg/dL
Microalb Creat Ratio: 150 mg/g{creat} — ABNORMAL HIGH (ref 0–29)
Microalb, Ur: 57.5 ug/mL — ABNORMAL HIGH

## 2024-05-24 NOTE — ED Provider Notes (Signed)
 [place] EMERGENCY DEPARTMENT AT [facility] Provider Note   CSN: [id number] Arrival date & time: 05/23/24  1919     History  Chief Complaint  Patient presents with   Flank Pain    [name] is a 63 y.o. female with a history including hypertension, type 2 diabetes, GERD, asthma and history of diverticulosis presenting for evaluation of left flank pain along with urinary frequency and nausea without emesis.  She states approximately 3 days ago she was sitting at her desk at work when she had sudden onset of left flank pain.  It has been waxing and waning in severity, it is worse with movement, she describes pain radiating to her left lateral hip region and to her lateral mid thigh.  Additionally she endorses nausea without emesis especially with escalating pain.  She has noticed increased urinary frequency but denies dysuria or hematuria.  She has had no fevers or chills pain is worse with movement.  She denies weakness or numbness in her legs and has had no urinary or fecal incontinence or retention.  Denies significant past history of low back problems.  She has taken Tylenol  without relief.  The history is provided by the patient.       Home Medications Prior to Admission medications   Medication Sig Start Date End Date Taking? Authorizing Provider  methocarbamol (ROBAXIN) 500 MG tablet Take 1 tablet (500 mg total) by mouth every 8 (eight) hours as needed for muscle spasms. 05/23/24  Yes Kato Wieczorek, PA-C  traMADol  (ULTRAM ) 50 MG tablet Take 1 tablet (50 mg total) by mouth every 6 (six) hours as needed. 05/23/24  Yes Lakia Gritton, Concha Deed, PA-C  albuterol  (VENTOLIN  HFA) 108 (90 Base) MCG/ACT inhaler Inhale 1-2 puffs into the lungs every 4 (four) hours as needed. 03/30/24     albuterol  (VENTOLIN  HFA) 108 (90 Base) MCG/ACT inhaler Inhale 2 puffs into the lungs every 6 (six) hours as needed. 04/14/24     ALPRAZolam  (XANAX ) 0.5 MG tablet Take 1 tablet (0.5 mg total) by mouth 2 (two)  times daily. 02/09/23     ALPRAZolam  (XANAX ) 0.5 MG tablet Take 1 tablet (0.5 mg total) by mouth 2 (two) times daily. 05/23/24     amLODipine  (NORVASC ) 10 MG tablet Take 1 tablet (10 mg total) by mouth daily. 08/03/23     amoxicillin -clavulanate (AUGMENTIN ) 875-125 MG tablet Take 1 tablet by mouth every 12 (twelve) hours. 09/30/23   Mesner, Reymundo Caulk, MD  aspirin  EC 81 MG tablet Take 81 mg by mouth daily.    [provider]  clobetasol  (TEMOVATE ) 0.05 % external solution Apply to scalp every other day as needed. 01/15/23     cloNIDine  (CATAPRES -TTS-1) 0.1 mg/24hr patch Place 1 patch (0.1 mg total) onto the skin every 7 (seven) days. 01/05/24     cyanocobalamin  (,VITAMIN B-12,) 1000 MCG/ML injection Inject 1 ml weekly for 8 weeks, then 1 ml every 2 weeks 01/28/22   Jonn Nett, DO  Difluprednate 0.05 % EMUL Place 1 drop into the right eye 4 (four) times daily. 05/19/24     doxycycline  (MONODOX ) 100 MG capsule Take 1 capsule (100 mg total) by mouth 2 (two) times daily for 10 days.  Take with a full glass of water  and do not lie down for at least 30 min. 10/22/23     EPINEPHrine  0.3 mg/0.3 mL IJ SOAJ injection Inject 0.3 mg into the muscle as needed for anaphylaxis. 09/30/23   Mesner, Reymundo Caulk, MD  estrogens , conjugated, (PREMARIN )  0.625 MG tablet Take 1 tablet (0.625 mg total) by mouth daily. 03/23/24     fluconazole  (DIFLUCAN ) 100 MG tablet Take 1 tablet (100 mg total) by mouth daily. 01/05/24     fluticasone  (FLONASE ) 50 MCG/ACT nasal spray Place 2 sprays into both nostrils Nightly. 03/01/24     fluticasone  (FLONASE ) 50 MCG/ACT nasal spray Place 2 sprays into both nostrils every evening. 04/14/24     glimepiride  (AMARYL ) 4 MG tablet Take 2 tablets (8 mg total) by mouth daily. 02/03/24     glipiZIDE  (GLUCOTROL ) 10 MG tablet Take 1 tablet (10 mg total) by mouth 2 (two) times daily 30 minutes before breakfast and dinner 02/21/24     glipiZIDE  (GLUCOTROL ) 10 MG tablet Take 1 tablet (10 mg total) by mouth daily.  05/15/24     hydrochlorothiazide  (HYDRODIURIL ) 50 MG tablet Take 1 tablet (50 mg total) by mouth daily. 07/21/23     ipratropium (ATROVENT ) 0.06 % nasal spray Place 2 sprays into each nostril 3 (three) times daily. 04/14/24     levocetirizine (XYZAL  ALLERGY 24HR) 5 MG tablet Take 1 tablet (5 mg total) by mouth daily. 03/30/24     levocetirizine (XYZAL ) 5 MG tablet Take 1 tablet (5 mg total) by mouth every evening. 04/14/24     meloxicam  (MOBIC ) 7.5 MG tablet Take 1 tablet (7.5 mg total) by mouth 2 (two) times daily as needed. 02/05/23     metFORMIN  (GLUCOPHAGE ) 500 MG tablet Take 1 tablet (500 mg total) by mouth 2 (two) times daily with a meal 02/21/24     metFORMIN  (GLUCOPHAGE -XR) 500 MG 24 hr tablet Take 1 tablet (500 mg total) by mouth 2 (two) times daily. 02/24/24     metoCLOPramide  (REGLAN ) 5 MG tablet Take 1 tablet (5 mg total) by mouth 3 (three) times daily before meals. 08/28/22     metoprolol  succinate (TOPROL -XL) 100 MG 24 hr tablet Take 1 tablet (100 mg total) by mouth daily. 05/22/24     mirabegron  ER (MYRBETRIQ ) 25 MG TB24 tablet Take 1 tablet (25 mg total) by mouth daily. 04/06/24     olmesartan  (BENICAR ) 40 MG tablet Take 1 tablet (40 mg total) by mouth daily. 02/24/24     ondansetron  (ZOFRAN -ODT) 4 MG disintegrating tablet Take 1 tablet (4 mg total) by mouth every 8 (eight) hours as needed for nausea or vomiting. 09/30/23   Mesner, Reymundo Caulk, MD  pantoprazole  (PROTONIX ) 40 MG tablet Take 1 tablet (40 mg total) by mouth daily. 08/30/23     potassium chloride  SA (KLOR-CON  M) 20 MEQ tablet Take 1 tablet (20 mEq total) by mouth daily. 09/30/23   Mesner, Reymundo Caulk, MD  sitaGLIPtin  (JANUVIA ) 100 MG tablet Take 1 tablet (100 mg total) by mouth daily. 05/01/24     traZODone  (DESYREL ) 50 MG tablet Take 0.5-1 tablets (25-50 mg total) by mouth at bedtime as needed. 07/21/22     Vibegron  (GEMTESA ) 75 MG TABS Take 1 tablet (75 mg total) by mouth daily. 09/30/23     Vitamin D , Ergocalciferol , (DRISDOL ) 1.25 MG (50000 UNIT)  CAPS capsule Take 1 capsule (50,000 Units total) by mouth every 7 (seven) days. 01/28/22   Jonn Nett, DO  zolpidem  (AMBIEN ) 10 MG tablet Take 1 tablet (10 mg total) by mouth at bedtime as needed for sleep. 07/21/22     insulin  glargine (LANTUS  SOLOSTAR) 100 UNIT/ML Solostar Pen Inject 20-80 Units into the skin at bedtime as directed. 01/05/24 02/03/24        Allergies    Barium-containing  compounds, Cimetidine, Shellfish allergy, Statins, Sulfa antibiotics, and Victoza [liraglutide]    Review of Systems   Review of Systems  Constitutional:  Negative for chills and fever.  HENT: Negative.    Eyes: Negative.   Respiratory:  Negative for chest tightness and shortness of breath.   Cardiovascular:  Negative for chest pain.  Gastrointestinal:  Positive for nausea. Negative for abdominal pain, constipation, diarrhea and vomiting.  Genitourinary:  Positive for frequency. Negative for dysuria, hematuria and urgency.  Musculoskeletal:  Positive for back pain. Negative for arthralgias, joint swelling and neck pain.  Skin: Negative.  Negative for rash and wound.  Neurological: Negative.  Negative for weakness.  Psychiatric/Behavioral: Negative.      Physical Exam Updated Vital Signs BP (!) 166/77   Pulse 78   Temp 97.8 F (36.6 C)   Resp 18   SpO2 95%  Physical Exam Vitals and nursing note reviewed.  Constitutional:      Appearance: She is well-developed.  HENT:     Head: Normocephalic.  Cardiovascular:     Rate and Rhythm: Normal rate.     Pulses: Normal pulses.     Comments: Pedal pulses normal. Pulmonary:     Effort: Pulmonary effort is normal.  Abdominal:     General: Bowel sounds are normal. There is no distension.     Palpations: Abdomen is soft. There is no mass.  Musculoskeletal:        General: Normal range of motion.     Cervical back: Normal range of motion and neck supple.     Lumbar back: Tenderness present. No swelling, edema or spasms.     Comments: Pt ambulatory  without assistance  Skin:    General: Skin is warm and dry.  Neurological:     General: No focal deficit present.     Mental Status: She is alert.     Sensory: No sensory deficit.     Motor: No tremor or atrophy.     Gait: Gait normal.     Comments: No strength deficit noted in hip and knee flexor and extensor muscle groups.  Ankle flexion and extension intact.     ED Results / Procedures / Treatments   Labs (all labs ordered are listed, but only abnormal results are displayed) Labs Reviewed  URINALYSIS, ROUTINE W REFLEX MICROSCOPIC - Abnormal; Notable for the following components:      Result Value   Color, Urine STRAW (*)    Protein, ur 30 (*)    Bacteria, UA FEW (*)    All other components within normal limits  BASIC METABOLIC PANEL WITH GFR - Abnormal; Notable for the following components:   Glucose, Bld 207 (*)    All other components within normal limits  CBC    EKG None  Radiology CT L-SPINE NO CHARGE Result Date: 05/23/2024 CLINICAL DATA:  Left-sided flank pain, low back pain EXAM: CT LUMBAR SPINE WITHOUT CONTRAST TECHNIQUE: Multidetector CT imaging of the lumbar spine was performed without intravenous contrast administration. Multiplanar CT image reconstructions were also generated. RADIATION DOSE REDUCTION: This exam was performed according to the departmental dose-optimization program which includes automated exposure control, adjustment of the mA and/or kV according to patient size and/or use of iterative reconstruction technique. COMPARISON:  None Available. FINDINGS: Segmentation: 5 non rib-bearing lumbar type vertebral bodies are noted. Alignment: Minimal degenerative anterolisthesis of L3 on L4 is noted. Vertebrae: Vertebral body height is well maintained. Multilevel facet hypertrophic changes are seen. Mild osteophytic changes are noted.  Paraspinal and other soft tissues: No acute soft tissue abnormality is noted. Disc levels: Mild disc bulging is noted at L3-4  related to the anterolisthesis. Facet hypertrophic changes contribute to moderate central canal stenosis. No focal disc herniation is identified. IMPRESSION: Multifocal degenerative changes without acute abnormality. Moderate central canal stenosis is noted at L3-4. Electronically Signed   By: Violeta Grey M.D.   On: 05/23/2024 22:47   CT Renal Stone Study Result Date: 05/23/2024 CLINICAL DATA:  Left-sided flank pain, nausea, urinary frequency EXAM: CT ABDOMEN AND PELVIS WITHOUT CONTRAST TECHNIQUE: Multidetector CT imaging of the abdomen and pelvis was performed following the standard protocol without IV contrast. RADIATION DOSE REDUCTION: This exam was performed according to the departmental dose-optimization program which includes automated exposure control, adjustment of the mA and/or kV according to patient size and/or use of iterative reconstruction technique. COMPARISON:  CT abdomen pelvis 12/28/2019 FINDINGS: Lower chest: No acute abnormality. Hepatobiliary: Hepatic steatosis. Cholecystectomy. No biliary dilation. Pancreas: Unremarkable. Spleen: Unremarkable. Adrenals/Urinary Tract: Normal adrenal glands. No urinary calculi or hydronephrosis. Unremarkable bladder. Stomach/Bowel: Normal caliber large and small bowel. Colonic diverticulosis without diverticulitis. Stomach and appendix are within normal limits. Vascular/Lymphatic: Aortic atherosclerosis. No enlarged abdominal or pelvic lymph nodes. Reproductive: Hysterectomy.  No adnexal mass. Other: No free intraperitoneal fluid or air. Musculoskeletal: No acute fracture. IMPRESSION: 1. No acute abnormality in the abdomen or pelvis. 2. Hepatic steatosis. 3. Colonic diverticulosis without diverticulitis. 4. Aortic Atherosclerosis (ICD10-I70.0). Electronically Signed   By: Rozell Cornet M.D.   On: 05/23/2024 22:42    Procedures Procedures    Medications Ordered in ED Medications  ketorolac  (TORADOL ) 30 MG/ML injection 15 mg (15 mg Intravenous Given  05/23/24 2230)  morphine  (PF) 4 MG/ML injection 4 mg (4 mg Intravenous Given 05/23/24 2231)  methocarbamol (ROBAXIN) tablet 500 mg (500 mg Oral Given 05/23/24 2232)  ondansetron  (ZOFRAN ) injection 4 mg (4 mg Intravenous Given 05/23/24 2230)    ED Course/ Medical Decision Making/ A&P                                 Medical Decision Making Patient presenting with a 3-day history of waxing and waning left flank pain which does radiate into her left thigh suggesting a musculoskeletal source.  However she also endorses increased urinary frequency without dysuria or hematuria.  This could be lumbosacral strain in addition to UTI, however cannot rule out kidney stone given the urinary complaint.  No fevers, no vomiting, although nausea is present.  Her exam is reassuring with no neurologic deficits.  Labs and imaging per below.  Given negative CT renal study, and otherwise no kidney stones, this most likely represents sciatica with radiation of pain into her left mid thigh.  She was given Robaxin, IV Toradol  and morphine  here after which her pain was completely relieved.  We discussed options for continued home treatment, she would prefer tramadol  versus anything stronger as she has had this medicine in the past and feels comfortable with it.  Will also add Robaxin.  With her history of GERD, anti-inflammatories are not tolerated.  We discussed home treatments including intermittent heat therapy, activity as tolerated.  Return precautions were outlined in discharge instructions.  Close follow-up with primary provider for symptoms are not improving with this treatment plan.  Amount and/or Complexity of Data Reviewed Labs: ordered.    Details: Labs reviewed, her CBC is normal with a WBC count of 10.0, be  met is stable, her glucose today is 207, she has no anion gap.  Urinalysis is negative for UTI. Radiology: ordered.    Details: CT renal reviewed, no kidney or ureteral stones are present.  Lumbar read revealing  for degenerative changes, there is some canal stenosis at the L3-L4 level, no acute or emergent findings.  This would correspond with her sciatica presentation.  Risk Prescription drug management.           Final Clinical Impression(s) / ED Diagnoses Final diagnoses:  Sciatica of left side    Rx / DC Orders ED Discharge Orders          Ordered    methocarbamol (ROBAXIN) 500 MG tablet  Every 8 hours PRN        05/23/24 2322    traMADol  (ULTRAM ) 50 MG tablet  Every 6 hours PRN        05/23/24 2322              Kevork Joyce, PA-C 05/24/24 1844    Ninetta Basket, MD 05/25/24 1557

## 2024-05-25 ENCOUNTER — Other Ambulatory Visit (HOSPITAL_COMMUNITY): Payer: Self-pay

## 2024-05-25 DIAGNOSIS — L739 Follicular disorder, unspecified: Secondary | ICD-10-CM | POA: Diagnosis not present

## 2024-05-25 DIAGNOSIS — L6611 Classic lichen planopilaris: Secondary | ICD-10-CM | POA: Diagnosis not present

## 2024-05-25 MED ORDER — FLUOCINOLONE ACETONIDE SCALP 0.01 % EX OIL
1.0000 | TOPICAL_OIL | Freq: Two times a day (BID) | CUTANEOUS | 2 refills | Status: AC | PRN
  Filled 2024-05-25: qty 118.28, 30d supply, fill #0

## 2024-05-26 ENCOUNTER — Other Ambulatory Visit (HOSPITAL_COMMUNITY): Payer: Self-pay

## 2024-05-29 ENCOUNTER — Other Ambulatory Visit (HOSPITAL_COMMUNITY): Payer: Self-pay

## 2024-05-29 ENCOUNTER — Other Ambulatory Visit: Payer: Self-pay

## 2024-05-30 ENCOUNTER — Other Ambulatory Visit (HOSPITAL_COMMUNITY): Payer: Self-pay

## 2024-05-30 ENCOUNTER — Other Ambulatory Visit (HOSPITAL_BASED_OUTPATIENT_CLINIC_OR_DEPARTMENT_OTHER): Payer: Self-pay

## 2024-05-30 DIAGNOSIS — I10 Essential (primary) hypertension: Secondary | ICD-10-CM | POA: Diagnosis not present

## 2024-05-30 DIAGNOSIS — E785 Hyperlipidemia, unspecified: Secondary | ICD-10-CM | POA: Diagnosis not present

## 2024-05-30 DIAGNOSIS — E1165 Type 2 diabetes mellitus with hyperglycemia: Secondary | ICD-10-CM | POA: Diagnosis not present

## 2024-05-30 MED ORDER — OZEMPIC (0.25 OR 0.5 MG/DOSE) 2 MG/3ML ~~LOC~~ SOPN
0.2500 mg | PEN_INJECTOR | SUBCUTANEOUS | 3 refills | Status: DC
  Filled 2024-05-30: qty 3, 28d supply, fill #0

## 2024-05-31 ENCOUNTER — Other Ambulatory Visit: Payer: Self-pay

## 2024-05-31 ENCOUNTER — Other Ambulatory Visit (HOSPITAL_COMMUNITY): Payer: Self-pay

## 2024-06-02 ENCOUNTER — Other Ambulatory Visit (HOSPITAL_COMMUNITY): Payer: Self-pay

## 2024-06-02 ENCOUNTER — Other Ambulatory Visit: Payer: Self-pay

## 2024-06-07 ENCOUNTER — Encounter (INDEPENDENT_AMBULATORY_CARE_PROVIDER_SITE_OTHER): Admitting: Ophthalmology

## 2024-06-12 ENCOUNTER — Other Ambulatory Visit (HOSPITAL_COMMUNITY): Payer: Self-pay

## 2024-06-12 NOTE — Progress Notes (Signed)
 [facility] - Clinic Note  06/13/2024     CHIEF COMPLAINT Patient presents for No chief complaint on file.   HISTORY OF PRESENT ILLNESS: [name] is a 63 y.o. female who presents to the clinic today for:     Referring physician: Bertell Satterfield, MD [address] [place],  [place] [address]  HISTORICAL INFORMATION:   Selected notes from the MEDICAL RECORD NUMBER Referred by Dr. JAMA:  Ocular Hx- PMH-    CURRENT MEDICATIONS: Current Outpatient Medications (Ophthalmic Drugs)  Medication Sig   Difluprednate  0.05 % EMUL Place 1 drop into the right eye 4 (four) times daily.   No current facility-administered medications for this visit. (Ophthalmic Drugs)   Current Outpatient Medications (Other)  Medication Sig   albuterol  (VENTOLIN  HFA) 108 (90 Base) MCG/ACT inhaler Inhale 1-2 puffs into the lungs every 4 (four) hours as needed.   albuterol  (VENTOLIN  HFA) 108 (90 Base) MCG/ACT inhaler Inhale 2 puffs into the lungs every 6 (six) hours as needed.   ALPRAZolam  (XANAX ) 0.5 MG tablet Take 1 tablet (0.5 mg total) by mouth 2 (two) times daily.   ALPRAZolam  (XANAX ) 0.5 MG tablet Take 1 tablet (0.5 mg total) by mouth 2 (two) times daily.   amLODipine  (NORVASC ) 10 MG tablet Take 1 tablet (10 mg total) by mouth daily.   amoxicillin -clavulanate (AUGMENTIN ) 875-125 MG tablet Take 1 tablet by mouth every 12 (twelve) hours.   aspirin  EC 81 MG tablet Take 81 mg by mouth daily.   clobetasol  (TEMOVATE ) 0.05 % external solution Apply to scalp every other day as needed.   cloNIDine  (CATAPRES -TTS-1) 0.1 mg/24hr patch Place 1 patch (0.1 mg total) onto the skin every 7 (seven) days.   cyanocobalamin  (,VITAMIN B-12,) 1000 MCG/ML injection Inject 1 ml weekly for 8 weeks, then 1 ml every 2 weeks   doxycycline  (MONODOX ) 100 MG capsule Take 1 capsule (100 mg total) by mouth 2 (two) times daily for 10 days.  Take with a full glass of water  and do not lie down for at least 30  min.   EPINEPHrine  0.3 mg/0.3 mL IJ SOAJ injection Inject 0.3 mg into the muscle as needed for anaphylaxis.   estrogens , conjugated, (PREMARIN ) 0.625 MG tablet Take 1 tablet (0.625 mg total) by mouth daily.   fluconazole  (DIFLUCAN ) 100 MG tablet Take 1 tablet (100 mg total) by mouth daily.   Fluocinolone  Acetonide Scalp (DERMA-SMOOTHE /FS SCALP) 0.01 % OIL Apply to scalp up to 2 (two) times daily as needed.   fluticasone  (FLONASE ) 50 MCG/ACT nasal spray Place 2 sprays into both nostrils Nightly.   fluticasone  (FLONASE ) 50 MCG/ACT nasal spray Place 2 sprays into both nostrils every evening.   glimepiride  (AMARYL ) 4 MG tablet Take 2 tablets (8 mg total) by mouth daily.   glipiZIDE  (GLUCOTROL ) 10 MG tablet Take 1 tablet (10 mg total) by mouth 2 (two) times daily 30 minutes before breakfast and dinner   glipiZIDE  (GLUCOTROL ) 10 MG tablet Take 1 tablet (10 mg total) by mouth daily.   hydrochlorothiazide  (HYDRODIURIL ) 50 MG tablet Take 1 tablet (50 mg total) by mouth daily.   ipratropium (ATROVENT ) 0.06 % nasal spray Place 2 sprays into each nostril 3 (three) times daily.   levocetirizine (XYZAL  ALLERGY 24HR) 5 MG tablet Take 1 tablet (5 mg total) by mouth daily.   levocetirizine (XYZAL ) 5 MG tablet Take 1 tablet (5 mg total) by mouth every evening.   meloxicam  (MOBIC ) 7.5 MG tablet Take 1 tablet (7.5 mg total)  by mouth 2 (two) times daily as needed.   metFORMIN  (GLUCOPHAGE ) 500 MG tablet Take 1 tablet (500 mg total) by mouth 2 (two) times daily with a meal   metFORMIN  (GLUCOPHAGE -XR) 500 MG 24 hr tablet Take 1 tablet (500 mg total) by mouth 2 (two) times daily.   methocarbamol  (ROBAXIN ) 500 MG tablet Take 1 tablet (500 mg total) by mouth every 8 (eight) hours as needed for muscle spasms.   metoCLOPramide  (REGLAN ) 5 MG tablet Take 1 tablet (5 mg total) by mouth 3 (three) times daily before meals.   metoprolol  succinate (TOPROL -XL) 100 MG 24 hr tablet Take 1 tablet (100 mg total) by mouth daily.    mirabegron  ER (MYRBETRIQ ) 25 MG TB24 tablet Take 1 tablet (25 mg total) by mouth daily.   olmesartan  (BENICAR ) 40 MG tablet Take 1 tablet (40 mg total) by mouth daily.   ondansetron  (ZOFRAN -ODT) 4 MG disintegrating tablet Take 1 tablet (4 mg total) by mouth every 8 (eight) hours as needed for nausea or vomiting.   pantoprazole  (PROTONIX ) 40 MG tablet Take 1 tablet (40 mg total) by mouth daily.   potassium chloride  SA (KLOR-CON  M) 20 MEQ tablet Take 1 tablet (20 mEq total) by mouth daily.   Semaglutide ,0.25 or 0.5MG /DOS, (OZEMPIC , 0.25 OR 0.5 MG/DOSE,) 2 MG/3ML SOPN Inject 0.25 mg into the skin once a week.   sitaGLIPtin  (JANUVIA ) 100 MG tablet Take 1 tablet (100 mg total) by mouth daily.   traMADol  (ULTRAM ) 50 MG tablet Take 1 tablet (50 mg total) by mouth every 6 (six) hours as needed.   traZODone  (DESYREL ) 50 MG tablet Take 0.5-1 tablets (25-50 mg total) by mouth at bedtime as needed.   Vibegron  (GEMTESA ) 75 MG TABS Take 1 tablet (75 mg total) by mouth daily.   Vitamin D , Ergocalciferol , (DRISDOL ) 1.25 MG (50000 UNIT) CAPS capsule Take 1 capsule (50,000 Units total) by mouth every 7 (seven) days.   zolpidem  (AMBIEN ) 10 MG tablet Take 1 tablet (10 mg total) by mouth at bedtime as needed for sleep.   No current facility-administered medications for this visit. (Other)      REVIEW OF SYSTEMS:    ALLERGIES Allergies  Allergen Reactions   Barium-Containing Compounds Itching and Other (See Comments)    Weakness, loss of voice   Cimetidine    Shellfish Allergy Swelling    Tongue swell   Statins Other (See Comments)    Myalgia, muscle weakness   Sulfa Antibiotics Itching and Other (See Comments)    Weakness, loss of voice   Victoza [Liraglutide] Diarrhea and Nausea Only    PAST MEDICAL HISTORY Past Medical History:  Diagnosis Date   Anxiety    Asthma    diagnosed as an adult, mild   Back pain    Diabetes mellitus    Diverticulosis 01/13/2018   Gallbladder problem    GERD  (gastroesophageal reflux disease)    Hypertension    Obesity    SOBOE (shortness of breath on exertion)    Past Surgical History:  Procedure Laterality Date   ABDOMINAL HYSTERECTOMY  2008   CARPAL TUNNEL RELEASE Left    CARPAL TUNNEL RELEASE Right 10/01/2021   Procedure: RIGHT CARPAL TUNNEL RELEASE;  Surgeon: Shari Sieving, MD;  Location: [facility];  Service: Orthopedics;  Laterality: Right;   CATARACT EXTRACTION W/PHACO Right 04/10/2024   Procedure: PHACOEMULSIFICATION, CATARACT, WITH IOL INSERTION;  Surgeon: Harrie Agent, MD;  Location: [facility];  Service: Ophthalmology;  Laterality: Right;  CDE 6.83  CESAREAN SECTION  1983   CHOLECYSTECTOMY  2002   SINUS EXPLORATION  1995   TONSILLECTOMY     TRIGGER FINGER RELEASE Left    thumb   WRIST SURGERY  1979    FAMILY HISTORY Family History  Problem Relation Age of Onset   Hypertension Mother    Diabetes Mother    Stroke Mother    Obesity Mother    Obesity Father     SOCIAL HISTORY Social History   Tobacco Use   Smoking status: Former    Current packs/day: 0.00    Types: Cigarettes    Quit date: 01/20/1989    Years since quitting: 35.4   Smokeless tobacco: Never  Vaping Use   Vaping status: Never Used  Substance Use Topics   Alcohol use: No    Alcohol/week: 0.0 standard drinks of alcohol   Drug use: No         OPHTHALMIC EXAM:  Not recorded     IMAGING AND PROCEDURES  Imaging and Procedures for 06/13/2024           ASSESSMENT/PLAN:  No diagnosis found.  1.  2.  3.  Ophthalmic Meds Ordered this visit:  No orders of the defined types were placed in this encounter.      No follow-ups on file.  There are no Patient Instructions on file for this visit.   Explained the diagnoses, plan, and follow up with the patient and they expressed understanding.  Patient expressed understanding of the importance of proper follow up care.   This document serves as a record of services  personally performed by Redell JUDITHANN Hans, MD, PhD. It was created on their behalf by Auston Muzzy, COMT. The creation of this record is the provider's dictation and/or activities during the visit.  Electronically signed by: Auston Muzzy, COMT 06/12/24 3:23 PM    Redell JUDITHANN Hans, M.D., Ph.D. Diseases & Surgery of the Retina and Vitreous [facility] @TODAY @     Abbreviations: M myopia (nearsighted); A astigmatism; H hyperopia (farsighted); P presbyopia; Mrx spectacle prescription;  CTL contact lenses; OD right eye; OS left eye; OU both eyes  XT exotropia; ET esotropia; PEK punctate epithelial keratitis; PEE punctate epithelial erosions; DES dry eye syndrome; MGD meibomian gland dysfunction; ATs artificial tears; PFAT's preservative free artificial tears; NSC nuclear sclerotic cataract; PSC posterior subcapsular cataract; ERM epi-retinal membrane; PVD posterior vitreous detachment; RD retinal detachment; DM diabetes mellitus; DR diabetic retinopathy; NPDR non-proliferative diabetic retinopathy; PDR proliferative diabetic retinopathy; CSME clinically significant macular edema; DME diabetic macular edema; dbh dot blot hemorrhages; CWS cotton wool spot; POAG primary open angle glaucoma; C/D cup-to-disc ratio; HVF humphrey visual field; GVF goldmann visual field; OCT optical coherence tomography; IOP intraocular pressure; BRVO Branch retinal vein occlusion; CRVO central retinal vein occlusion; CRAO central retinal artery occlusion; BRAO branch retinal artery occlusion; RT retinal tear; SB scleral buckle; PPV pars plana vitrectomy; VH Vitreous hemorrhage; PRP panretinal laser photocoagulation; IVK intravitreal kenalog ; VMT vitreomacular traction; MH Macular hole;  NVD neovascularization of the disc; NVE neovascularization elsewhere; AREDS age related eye disease study; ARMD age related macular degeneration; POAG primary open angle glaucoma; EBMD epithelial/anterior basement membrane  dystrophy; ACIOL anterior chamber intraocular lens; IOL intraocular lens; PCIOL posterior chamber intraocular lens; Phaco/IOL phacoemulsification with intraocular lens placement; PRK photorefractive keratectomy; LASIK laser assisted in situ keratomileusis; HTN hypertension; DM diabetes mellitus; COPD chronic obstructive pulmonary disease

## 2024-06-13 ENCOUNTER — Encounter (INDEPENDENT_AMBULATORY_CARE_PROVIDER_SITE_OTHER): Payer: Self-pay | Admitting: Ophthalmology

## 2024-06-13 ENCOUNTER — Ambulatory Visit (INDEPENDENT_AMBULATORY_CARE_PROVIDER_SITE_OTHER): Admitting: Ophthalmology

## 2024-06-13 DIAGNOSIS — H35351 Cystoid macular degeneration, right eye: Secondary | ICD-10-CM | POA: Diagnosis not present

## 2024-06-13 DIAGNOSIS — Z961 Presence of intraocular lens: Secondary | ICD-10-CM

## 2024-06-13 DIAGNOSIS — Z7984 Long term (current) use of oral hypoglycemic drugs: Secondary | ICD-10-CM

## 2024-06-13 DIAGNOSIS — H35033 Hypertensive retinopathy, bilateral: Secondary | ICD-10-CM

## 2024-06-13 DIAGNOSIS — H353132 Nonexudative age-related macular degeneration, bilateral, intermediate dry stage: Secondary | ICD-10-CM | POA: Diagnosis not present

## 2024-06-13 DIAGNOSIS — H25812 Combined forms of age-related cataract, left eye: Secondary | ICD-10-CM

## 2024-06-13 DIAGNOSIS — E119 Type 2 diabetes mellitus without complications: Secondary | ICD-10-CM | POA: Diagnosis not present

## 2024-06-13 DIAGNOSIS — I10 Essential (primary) hypertension: Secondary | ICD-10-CM

## 2024-06-13 DIAGNOSIS — H3581 Retinal edema: Secondary | ICD-10-CM

## 2024-06-14 ENCOUNTER — Other Ambulatory Visit (HOSPITAL_COMMUNITY): Payer: Self-pay

## 2024-06-14 ENCOUNTER — Other Ambulatory Visit: Payer: Self-pay

## 2024-06-14 ENCOUNTER — Encounter (INDEPENDENT_AMBULATORY_CARE_PROVIDER_SITE_OTHER): Payer: Self-pay | Admitting: Ophthalmology

## 2024-06-19 ENCOUNTER — Other Ambulatory Visit (HOSPITAL_COMMUNITY): Payer: Self-pay

## 2024-06-22 ENCOUNTER — Other Ambulatory Visit (HOSPITAL_COMMUNITY): Payer: Self-pay

## 2024-06-26 ENCOUNTER — Other Ambulatory Visit (HOSPITAL_COMMUNITY): Payer: Self-pay

## 2024-06-29 DIAGNOSIS — I10 Essential (primary) hypertension: Secondary | ICD-10-CM | POA: Diagnosis not present

## 2024-06-29 DIAGNOSIS — E1165 Type 2 diabetes mellitus with hyperglycemia: Secondary | ICD-10-CM | POA: Diagnosis not present

## 2024-06-29 DIAGNOSIS — E785 Hyperlipidemia, unspecified: Secondary | ICD-10-CM | POA: Diagnosis not present

## 2024-06-30 ENCOUNTER — Other Ambulatory Visit: Payer: Self-pay

## 2024-06-30 ENCOUNTER — Other Ambulatory Visit (HOSPITAL_COMMUNITY): Payer: Self-pay

## 2024-06-30 MED ORDER — FREESTYLE LIBRE 3 PLUS SENSOR MISC
1.0000 | 11 refills | Status: AC
  Filled 2024-06-30: qty 2, 30d supply, fill #0
  Filled 2024-07-06 – 2024-08-09 (×2): qty 2, 30d supply, fill #1

## 2024-06-30 MED ORDER — TOUJEO SOLOSTAR 300 UNIT/ML ~~LOC~~ SOPN
10.0000 [IU] | PEN_INJECTOR | Freq: Every day | SUBCUTANEOUS | 5 refills | Status: AC
  Filled 2024-06-30: qty 3, 30d supply, fill #0
  Filled 2024-07-06 – 2024-08-04 (×2): qty 3, 30d supply, fill #1

## 2024-06-30 MED ORDER — INSULIN PEN NEEDLE 32G X 4 MM MISC
1.0000 | Freq: Every day | 3 refills | Status: AC
  Filled 2024-06-30: qty 100, 90d supply, fill #0
  Filled 2024-09-26: qty 100, 90d supply, fill #1

## 2024-07-05 ENCOUNTER — Other Ambulatory Visit: Payer: Self-pay

## 2024-07-05 ENCOUNTER — Other Ambulatory Visit (HOSPITAL_COMMUNITY): Payer: Self-pay

## 2024-07-06 ENCOUNTER — Other Ambulatory Visit: Payer: Self-pay

## 2024-07-06 ENCOUNTER — Other Ambulatory Visit (HOSPITAL_COMMUNITY): Payer: Self-pay

## 2024-07-07 ENCOUNTER — Other Ambulatory Visit (HOSPITAL_COMMUNITY): Payer: Self-pay

## 2024-07-10 NOTE — Progress Notes (Signed)
 [facility] - Clinic Note  07/11/2024   CHIEF COMPLAINT Patient presents for Retina Follow Up  HISTORY OF PRESENT ILLNESS: [name] is a 63 y.o. female who presents to the clinic today for:  HPI     Retina Follow Up   Diagnosis: CME.  In right eye.  This started 3.  Severity is moderate.  Duration of 4 weeks.  I, the attending physician,  performed the HPI with the patient and updated documentation appropriately.        Comments   Patient states vision seems to be improving but she needs new glasses. Pt denies FOL/new floaters/pain. Pt is using Difluprednate  at least TID OD and Systane BID OS.       Last edited by Kemaya Dorner, MD on 07/11/2024 10:22 PM.    Pt states she has been using difluprednate  at least 3 times a day, but is trying to do 4, her dr just added toujeo  to her medication list, her blood sugar was 89 this am  Referring physician: Harrie Agent, MD [id number] [place],  [place] [address]  HISTORICAL INFORMATION:  Selected notes from the MEDICAL RECORD NUMBER Referred by Dr. Harrie for macular edema, possible post op CME LEE:  Ocular Hx- PMH-   CURRENT MEDICATIONS: Current Outpatient Medications (Ophthalmic Drugs)  Medication Sig   ketorolac  (ACULAR ) 0.5 % ophthalmic solution Place 1 drop into the right eye 4 (four) times daily.   Difluprednate  0.05 % EMUL Place 1 drop into the right eye 4 (four) times daily.   No current facility-administered medications for this visit. (Ophthalmic Drugs)   Current Outpatient Medications (Other)  Medication Sig   albuterol  (VENTOLIN  HFA) 108 (90 Base) MCG/ACT inhaler Inhale 1-2 puffs into the lungs every 4 (four) hours as needed.   albuterol  (VENTOLIN  HFA) 108 (90 Base) MCG/ACT inhaler Inhale 2 puffs into the lungs every 6 (six) hours as needed.   ALPRAZolam  (XANAX ) 0.5 MG tablet Take 1 tablet (0.5 mg total) by mouth 2 (two) times daily.   ALPRAZolam  (XANAX ) 0.5 MG tablet Take 1  tablet (0.5 mg total) by mouth 2 (two) times daily.   amLODipine  (NORVASC ) 10 MG tablet Take 1 tablet (10 mg total) by mouth daily.   amoxicillin -clavulanate (AUGMENTIN ) 875-125 MG tablet Take 1 tablet by mouth every 12 (twelve) hours.   aspirin  EC 81 MG tablet Take 81 mg by mouth daily.   clobetasol  (TEMOVATE ) 0.05 % external solution Apply to scalp every other day as needed.   cloNIDine  (CATAPRES -TTS-1) 0.1 mg/24hr patch Place 1 patch (0.1 mg total) onto the skin every 7 (seven) days.   Continuous Glucose Sensor (FREESTYLE LIBRE 3 PLUS SENSOR) MISC change sensor every 15 days   cyanocobalamin  (,VITAMIN B-12,) 1000 MCG/ML injection Inject 1 ml weekly for 8 weeks, then 1 ml every 2 weeks   doxycycline  (MONODOX ) 100 MG capsule Take 1 capsule (100 mg total) by mouth 2 (two) times daily for 10 days.  Take with a full glass of water  and do not lie down for at least 30 min.   EPINEPHrine  0.3 mg/0.3 mL IJ SOAJ injection Inject 0.3 mg into the muscle as needed for anaphylaxis.   estrogens , conjugated, (PREMARIN ) 0.625 MG tablet Take 1 tablet (0.625 mg total) by mouth daily.   fluconazole  (DIFLUCAN ) 100 MG tablet Take 1 tablet (100 mg total) by mouth daily.   Fluocinolone  Acetonide Scalp (DERMA-SMOOTHE /FS SCALP) 0.01 % OIL Apply to scalp up to 2 (two) times  daily as needed.   fluticasone  (FLONASE ) 50 MCG/ACT nasal spray Place 2 sprays into both nostrils Nightly.   fluticasone  (FLONASE ) 50 MCG/ACT nasal spray Place 2 sprays into both nostrils every evening.   glimepiride  (AMARYL ) 4 MG tablet Take 2 tablets (8 mg total) by mouth daily.   glipiZIDE  (GLUCOTROL ) 10 MG tablet Take 1 tablet (10 mg total) by mouth 2 (two) times daily 30 minutes before breakfast and dinner   glipiZIDE  (GLUCOTROL ) 10 MG tablet Take 1 tablet (10 mg total) by mouth daily.   hydrochlorothiazide  (HYDRODIURIL ) 50 MG tablet Take 1 tablet (50 mg total) by mouth daily.   insulin  glargine, 1 Unit Dial , (TOUJEO  SOLOSTAR) 300 UNIT/ML Solostar  Pen Inject 10 Units into the skin daily.   Insulin  Pen Needle 32G X 4 MM MISC use as directed for daily injection.   ipratropium (ATROVENT ) 0.06 % nasal spray Place 2 sprays into each nostril 3 (three) times daily.   levocetirizine (XYZAL  ALLERGY 24HR) 5 MG tablet Take 1 tablet (5 mg total) by mouth daily.   levocetirizine (XYZAL ) 5 MG tablet Take 1 tablet (5 mg total) by mouth every evening.   meloxicam  (MOBIC ) 7.5 MG tablet Take 1 tablet (7.5 mg total) by mouth 2 (two) times daily as needed.   metFORMIN  (GLUCOPHAGE ) 500 MG tablet Take 1 tablet (500 mg total) by mouth 2 (two) times daily with a meal   metFORMIN  (GLUCOPHAGE -XR) 500 MG 24 hr tablet Take 1 tablet (500 mg total) by mouth 2 (two) times daily.   methocarbamol  (ROBAXIN ) 500 MG tablet Take 1 tablet (500 mg total) by mouth every 8 (eight) hours as needed for muscle spasms.   metoCLOPramide  (REGLAN ) 5 MG tablet Take 1 tablet (5 mg total) by mouth 3 (three) times daily before meals.   metoprolol  succinate (TOPROL -XL) 100 MG 24 hr tablet Take 1 tablet (100 mg total) by mouth daily.   olmesartan  (BENICAR ) 40 MG tablet Take 1 tablet (40 mg total) by mouth daily.   ondansetron  (ZOFRAN -ODT) 4 MG disintegrating tablet Take 1 tablet (4 mg total) by mouth every 8 (eight) hours as needed for nausea or vomiting.   pantoprazole  (PROTONIX ) 40 MG tablet Take 1 tablet (40 mg total) by mouth daily.   potassium chloride  SA (KLOR-CON  M) 20 MEQ tablet Take 1 tablet (20 mEq total) by mouth daily.   sitaGLIPtin  (JANUVIA ) 100 MG tablet Take 1 tablet (100 mg total) by mouth daily.   traMADol  (ULTRAM ) 50 MG tablet Take 1 tablet (50 mg total) by mouth every 6 (six) hours as needed.   traZODone  (DESYREL ) 50 MG tablet Take 0.5-1 tablets (25-50 mg total) by mouth at bedtime as needed.   Vibegron  (GEMTESA ) 75 MG TABS Take 1 tablet (75 mg total) by mouth daily.   Vitamin D , Ergocalciferol , (DRISDOL ) 1.25 MG (50000 UNIT) CAPS capsule Take 1 capsule (50,000 Units total)  by mouth every 7 (seven) days.   zolpidem  (AMBIEN ) 10 MG tablet Take 1 tablet (10 mg total) by mouth at bedtime as needed for sleep.   No current facility-administered medications for this visit. (Other)   REVIEW OF SYSTEMS: ROS   Positive for: Endocrine, Cardiovascular, Eyes Last edited by Elnor Avelina RAMAN, COT on 07/11/2024  1:52 PM.      ALLERGIES Allergies  Allergen Reactions   Barium-Containing Compounds Itching and Other (See Comments)    Weakness, loss of voice   Cimetidine    Shellfish Allergy Swelling    Tongue swell   Statins Other (See  Comments)    Myalgia, muscle weakness   Sulfa Antibiotics Itching and Other (See Comments)    Weakness, loss of voice   Victoza [Liraglutide] Diarrhea and Nausea Only   PAST MEDICAL HISTORY Past Medical History:  Diagnosis Date   Anxiety    Asthma    diagnosed as an adult, mild   Back pain    Diabetes mellitus    Diverticulosis 01/13/2018   Gallbladder problem    GERD (gastroesophageal reflux disease)    Hypertension    Obesity    SOBOE (shortness of breath on exertion)    Past Surgical History:  Procedure Laterality Date   ABDOMINAL HYSTERECTOMY  2008   CARPAL TUNNEL RELEASE Left    CARPAL TUNNEL RELEASE Right 10/01/2021   Procedure: RIGHT CARPAL TUNNEL RELEASE;  Surgeon: Shari Sieving, MD;  Location: [facility];  Service: Orthopedics;  Laterality: Right;   CATARACT EXTRACTION W/PHACO Right 04/10/2024   Procedure: PHACOEMULSIFICATION, CATARACT, WITH IOL INSERTION;  Surgeon: Harrie Agent, MD;  Location: [facility];  Service: Ophthalmology;  Laterality: Right;  CDE 6.83   CESAREAN SECTION  1983   CHOLECYSTECTOMY  2002   SINUS EXPLORATION  1995   TONSILLECTOMY     TRIGGER FINGER RELEASE Left    thumb   WRIST SURGERY  1979   FAMILY HISTORY Family History  Problem Relation Age of Onset   Hypertension Mother    Diabetes Mother    Stroke Mother    Obesity Mother    Obesity Father    SOCIAL  HISTORY Social History   Tobacco Use   Smoking status: Former    Current packs/day: 0.00    Types: Cigarettes    Quit date: 01/20/1989    Years since quitting: 35.5   Smokeless tobacco: Never  Vaping Use   Vaping status: Never Used  Substance Use Topics   Alcohol use: No    Alcohol/week: 0.0 standard drinks of alcohol   Drug use: No       OPHTHALMIC EXAM:  Base Eye Exam     Visual Acuity (Snellen - Linear)       Right Left   Dist cc 20/30 -2 20/30 +2   Dist ph cc 20/20 -1 20/20 -1    Correction: Glasses         Tonometry (Tonopen, 1:59 PM)       Right Left   Pressure 19 15         Pupils       Pupils Dark Light Shape React APD   Right PERRL 3 2 Round Brisk None   Left PERRL              Visual Fields       Left Right    Full Full         Extraocular Movement       Right Left    Full Full         Neuro/Psych     Oriented x3: Yes   Mood/Affect: Normal         Dilation     Both eyes: 1.0% Mydriacyl , 2.5% Phenylephrine  @ 1:59 PM           Slit Lamp and Fundus Exam     Slit Lamp Exam       Right Left   Lids/Lashes Dermatochalasis - upper lid Dermatochalasis - upper lid   Conjunctiva/Sclera mild melanosis mild melanosis   Cornea arcus, well healed cataract wound, trace PEE trace  PEE   Anterior Chamber Deep, 1-2+ fine cell and pigment deep and clear   Iris Round and dilated Round and dilated   Lens PC IOL in good position 2-3+ Nuclear sclerosis with brunescence, 3+ Cortical cataract   Anterior Vitreous Vitreous syneresis Vitreous syneresis         Fundus Exam       Right Left   Disc Pink and Sharp Pink and Sharp   C/D Ratio 0.3 0.3   Macula Flat, Blunted foveal reflex, Drusen, RPE mottling, trace cystic changes -- slightly increased, no heme Flat, Good foveal reflex, Drusen, RPE mottling, No heme or edema   Vessels attenuated, mild tortuosity attenuated, mild tortuosity   Periphery Attached, No heme Attached, No heme            Refraction     Wearing Rx       Sphere Cylinder Axis Add   Right -4.50 +1.50 016 +2.75   Left -5.75 +2.50 167 +2.75           IMAGING AND PROCEDURES  Imaging and Procedures for 07/11/2024  OCT, Retina - OU - Both Eyes       Right Eye Quality was good. Central Foveal Thickness: 281. Progression has worsened. Findings include normal foveal contour, no IRF, no SRF, retinal drusen , pigment epithelial detachment, vitreomacular adhesion (Mild Interval increase in focal cystic changes nasal fovea ).   Left Eye Quality was good. Central Foveal Thickness: 242. Progression has been stable. Findings include normal foveal contour, no IRF, no SRF, retinal drusen , vitreomacular adhesion .   Notes *Images captured and stored on drive  Diagnosis / Impression:  OD: Mild Interval increase in focal cystic changes nasal fovea  OS: NFP, no IRF / SRF Non-exu ARMD OU  Clinical management:  See below  Abbreviations: NFP - Normal foveal profile. CME - cystoid macular edema. PED - pigment epithelial detachment. IRF - intraretinal fluid. SRF - subretinal fluid. EZ - ellipsoid zone. ERM - epiretinal membrane. ORA - outer retinal atrophy. ORT - outer retinal tubulation. SRHM - subretinal hyper-reflective material. IRHM - intraretinal hyper-reflective material      Injection into Tenon's Capsule - OD - Right Eye       Time Out 07/11/2024. 2:30 PM. Confirmed correct patient, procedure, site, and patient consented.   Anesthesia Topical anesthesia was used. Anesthetic medications included Lidocaine  2%, Proparacaine 0.5%.   Procedure Preparation included 5% betadine  to ocular surface, eyelid speculum. A (25g) needle was used.   Injection: 40 mg triamcinolone  acetonide 40 MG/ML   Route: Other, Site: Right Eye   NDC: [phone], Lot: [id number], Expiration date: 11/12/2025, Waste: 0 mL   Post-op The patient tolerated the procedure well. There were no complications. The patient  received written and verbal post procedure care education. Post injection medications included ocuflox .   Notes 1.0 cc of Kenalog -40 (20 mg) injected into subtenon's capsule in the superotemporal quadrant. Betadine  was applied to Injection area pre and post-injection then rinsed with sterile BSS. 1 drop of ofloxacin  was instilled into the eye. There were no complications. Pt tolerated procedure well.           ASSESSMENT/PLAN:   ICD-10-CM   1. Cystoid macular edema of right eye  H35.351 OCT, Retina - OU - Both Eyes    Injection into Tenon's Capsule - OD - Right Eye    triamcinolone  acetonide (KENALOG -40) injection 40 mg    2. Intermediate stage nonexudative age-related macular degeneration of both eyes  H35.3132     3. Diabetes mellitus type 2 without retinopathy (HCC)  E11.9     4. Long term (current) use of oral hypoglycemic drugs  Z79.84     5. Essential hypertension  I10     6. Hypertensive retinopathy of both eyes  H35.033     7. Combined forms of age-related cataract of left eye  H25.812     8. Pseudophakia  Z96.1       CME OD - suspect post op CME related to cataract sx on April 28th - diagnosed on 05.29.25 post op visit - started on combo drop QID OD - currently on Difluprednate  BID OD -- supposed to be QID  - BCVA OD stable at 20/20 - OCT shows Mild Interval increase in focal cystic changes nasal fovea  - ?possible DME component - encouraged pt to increase difluprednate  drops to QID and will start ketorolac  QID OD - recommend STK OD #1 today, 07.29.25 - pt wishes to proceed with injection - RBA of procedure discussed, questions answered - informed consent obtained and signed - see procedure note - f/u 4 weeks, DFE, OCT, possible FA (transit OD)  2. Age related macular degeneration, non-exudative, both eyes  - The incidence, anatomy, and pathology of dry AMD, risk of progression, and the AREDS and AREDS 2 study including smoking risks discussed with patient.  -  Recommend amsler grid monitoring  - monitor  3,4. Diabetes mellitus, type 2 without retinopathy  - A1c: 8.3 on 06.10.25 - The incidence, risk factors for progression, natural history and treatment options for diabetic retinopathy  were discussed with patient.   - The need for close monitoring of blood glucose, blood pressure, and serum lipids, avoiding cigarette or any type of tobacco, and the need for long term follow up was also discussed with patient. - ?DME component to macular edema OD  5,6. Hypertensive retinopathy OU - discussed importance of tight BP control - monitor  7. Mixed Cataract OS - The symptoms of cataract, surgical options, and treatments and risks were discussed with patient. - discussed diagnosis and progression - under the expert management of Dr. Mordecai  8. Pseudophakia OD  - s/p CE/IOL OD (Dr. Harrie, 04.28.25)  - IOL in good position, doing well  - CME as above  - monitor  Ophthalmic Meds Ordered this visit:  Meds ordered this encounter  Medications   ketorolac  (ACULAR ) 0.5 % ophthalmic solution    Sig: Place 1 drop into the right eye 4 (four) times daily.    Dispense:  10 mL    Refill:  3   Difluprednate  0.05 % EMUL    Sig: Place 1 drop into the right eye 4 (four) times daily.    Dispense:  5 mL    Refill:  1    USE Rx DISCOUNT CARD: $74.93,  APW:[id number],  ERW:[id number],  Group:[id number],  PI:[id number]   triamcinolone  acetonide (KENALOG -40) injection 40 mg     Return in about 4 weeks (around 08/08/2024) for f/u CME OD, DFE, OCT.  There are no Patient Instructions on file for this visit.  Explained the diagnoses, plan, and follow up with the patient and they expressed understanding.  Patient expressed understanding of the importance of proper follow up care.   This document serves as a record of services personally performed by Redell JUDITHANN Hans, MD, PhD. It was created on their behalf by Auston Muzzy, COMT. The creation of this record is the provider's  dictation and/or activities during the  visit.  Electronically signed by: Auston Muzzy, COMT 07/16/24 2:36 AM  This document serves as a record of services personally performed by Redell JUDITHANN Hans, MD, PhD. It was created on their behalf by Alan PARAS. Delores, OA an ophthalmic technician. The creation of this record is the provider's dictation and/or activities during the visit.    Electronically signed by: Alan PARAS. Delores, OA 07/16/24 2:36 AM  Redell JUDITHANN Hans, M.D., Ph.D. Diseases & Surgery of the Retina and Vitreous [facility]  I have reviewed the above documentation for accuracy and completeness, and I agree with the above. Redell JUDITHANN Hans, M.D., Ph.D. 07/16/24 2:40 AM   Abbreviations: M myopia (nearsighted); A astigmatism; H hyperopia (farsighted); P presbyopia; Mrx spectacle prescription;  CTL contact lenses; OD right eye; OS left eye; OU both eyes  XT exotropia; ET esotropia; PEK punctate epithelial keratitis; PEE punctate epithelial erosions; DES dry eye syndrome; MGD meibomian gland dysfunction; ATs artificial tears; PFAT's preservative free artificial tears; NSC nuclear sclerotic cataract; PSC posterior subcapsular cataract; ERM epi-retinal membrane; PVD posterior vitreous detachment; RD retinal detachment; DM diabetes mellitus; DR diabetic retinopathy; NPDR non-proliferative diabetic retinopathy; PDR proliferative diabetic retinopathy; CSME clinically significant macular edema; DME diabetic macular edema; dbh dot blot hemorrhages; CWS cotton wool spot; POAG primary open angle glaucoma; C/D cup-to-disc ratio; HVF humphrey visual field; GVF goldmann visual field; OCT optical coherence tomography; IOP intraocular pressure; BRVO Branch retinal vein occlusion; CRVO central retinal vein occlusion; CRAO central retinal artery occlusion; BRAO branch retinal artery occlusion; RT retinal tear; SB scleral buckle; PPV pars plana vitrectomy; VH Vitreous hemorrhage; PRP panretinal laser  photocoagulation; IVK intravitreal kenalog ; VMT vitreomacular traction; MH Macular hole;  NVD neovascularization of the disc; NVE neovascularization elsewhere; AREDS age related eye disease study; ARMD age related macular degeneration; POAG primary open angle glaucoma; EBMD epithelial/anterior basement membrane dystrophy; ACIOL anterior chamber intraocular lens; IOL intraocular lens; PCIOL posterior chamber intraocular lens; Phaco/IOL phacoemulsification with intraocular lens placement; PRK photorefractive keratectomy; LASIK laser assisted in situ keratomileusis; HTN hypertension; DM diabetes mellitus; COPD chronic obstructive pulmonary disease

## 2024-07-11 ENCOUNTER — Other Ambulatory Visit (HOSPITAL_COMMUNITY): Payer: Self-pay

## 2024-07-11 ENCOUNTER — Ambulatory Visit (INDEPENDENT_AMBULATORY_CARE_PROVIDER_SITE_OTHER): Admitting: Ophthalmology

## 2024-07-11 ENCOUNTER — Other Ambulatory Visit: Payer: Self-pay

## 2024-07-11 ENCOUNTER — Encounter (INDEPENDENT_AMBULATORY_CARE_PROVIDER_SITE_OTHER): Payer: Self-pay | Admitting: Ophthalmology

## 2024-07-11 DIAGNOSIS — H35351 Cystoid macular degeneration, right eye: Secondary | ICD-10-CM | POA: Diagnosis not present

## 2024-07-11 DIAGNOSIS — I10 Essential (primary) hypertension: Secondary | ICD-10-CM

## 2024-07-11 DIAGNOSIS — H35033 Hypertensive retinopathy, bilateral: Secondary | ICD-10-CM | POA: Diagnosis not present

## 2024-07-11 DIAGNOSIS — H353132 Nonexudative age-related macular degeneration, bilateral, intermediate dry stage: Secondary | ICD-10-CM | POA: Diagnosis not present

## 2024-07-11 DIAGNOSIS — Z7984 Long term (current) use of oral hypoglycemic drugs: Secondary | ICD-10-CM | POA: Diagnosis not present

## 2024-07-11 DIAGNOSIS — E119 Type 2 diabetes mellitus without complications: Secondary | ICD-10-CM | POA: Diagnosis not present

## 2024-07-11 DIAGNOSIS — Z961 Presence of intraocular lens: Secondary | ICD-10-CM | POA: Diagnosis not present

## 2024-07-11 DIAGNOSIS — H25812 Combined forms of age-related cataract, left eye: Secondary | ICD-10-CM | POA: Diagnosis not present

## 2024-07-11 MED ORDER — TRIAMCINOLONE ACETONIDE 40 MG/ML IJ SUSP FOR KALEIDOSCOPE
40.0000 mg | INTRAMUSCULAR | Status: AC | PRN
  Administered 2024-07-11: 40 mg

## 2024-07-11 MED ORDER — KETOROLAC TROMETHAMINE 0.5 % OP SOLN
1.0000 [drp] | Freq: Four times a day (QID) | OPHTHALMIC | 3 refills | Status: AC
  Filled 2024-07-11: qty 10, 50d supply, fill #0
  Filled 2024-09-12: qty 10, 50d supply, fill #1
  Filled 2024-12-22: qty 10, 50d supply, fill #2

## 2024-07-11 MED ORDER — DIFLUPREDNATE 0.05 % OP EMUL
1.0000 [drp] | Freq: Four times a day (QID) | OPHTHALMIC | 1 refills | Status: DC
  Filled 2024-07-11: qty 5, 25d supply, fill #0
  Filled 2024-09-12: qty 5, 25d supply, fill #1

## 2024-07-12 DIAGNOSIS — E1165 Type 2 diabetes mellitus with hyperglycemia: Secondary | ICD-10-CM | POA: Diagnosis not present

## 2024-07-12 DIAGNOSIS — E785 Hyperlipidemia, unspecified: Secondary | ICD-10-CM | POA: Diagnosis not present

## 2024-07-12 DIAGNOSIS — I10 Essential (primary) hypertension: Secondary | ICD-10-CM | POA: Diagnosis not present

## 2024-07-17 ENCOUNTER — Other Ambulatory Visit (HOSPITAL_BASED_OUTPATIENT_CLINIC_OR_DEPARTMENT_OTHER): Payer: Self-pay

## 2024-07-17 ENCOUNTER — Other Ambulatory Visit (HOSPITAL_COMMUNITY): Payer: Self-pay

## 2024-07-17 MED ORDER — AMLODIPINE BESYLATE 10 MG PO TABS
10.0000 mg | ORAL_TABLET | Freq: Every day | ORAL | 3 refills | Status: AC
  Filled 2024-07-17: qty 90, 90d supply, fill #0
  Filled 2024-10-25: qty 90, 90d supply, fill #1
  Filled 2025-01-18: qty 90, 90d supply, fill #2

## 2024-07-18 ENCOUNTER — Other Ambulatory Visit (HOSPITAL_COMMUNITY): Payer: Self-pay

## 2024-07-19 ENCOUNTER — Other Ambulatory Visit (HOSPITAL_COMMUNITY): Payer: Self-pay

## 2024-07-19 DIAGNOSIS — L659 Nonscarring hair loss, unspecified: Secondary | ICD-10-CM | POA: Diagnosis not present

## 2024-07-19 DIAGNOSIS — L309 Dermatitis, unspecified: Secondary | ICD-10-CM | POA: Diagnosis not present

## 2024-07-19 DIAGNOSIS — L6611 Classic lichen planopilaris: Secondary | ICD-10-CM | POA: Diagnosis not present

## 2024-07-19 MED ORDER — CLINDAMYCIN PHOSPHATE 1 % EX LOTN
1.0000 | TOPICAL_LOTION | Freq: Every day | CUTANEOUS | 2 refills | Status: AC
  Filled 2024-07-19: qty 60, 30d supply, fill #0
  Filled 2024-08-14: qty 60, 30d supply, fill #1
  Filled 2024-10-16: qty 60, 30d supply, fill #2

## 2024-07-20 ENCOUNTER — Other Ambulatory Visit (HOSPITAL_COMMUNITY): Payer: Self-pay

## 2024-07-20 MED ORDER — DEXCOM G7 SENSOR MISC
11 refills | Status: AC
  Filled 2024-07-20 – 2024-07-24 (×3): qty 3, 30d supply, fill #0
  Filled 2024-08-21: qty 3, 30d supply, fill #1
  Filled 2024-09-04: qty 1, 10d supply, fill #2

## 2024-07-24 ENCOUNTER — Other Ambulatory Visit (HOSPITAL_COMMUNITY): Payer: Self-pay

## 2024-07-24 ENCOUNTER — Other Ambulatory Visit: Payer: Self-pay

## 2024-07-25 ENCOUNTER — Other Ambulatory Visit (HOSPITAL_COMMUNITY): Payer: Self-pay

## 2024-07-25 ENCOUNTER — Other Ambulatory Visit: Payer: Self-pay

## 2024-07-25 ENCOUNTER — Encounter: Payer: Self-pay | Admitting: Pharmacist

## 2024-07-26 ENCOUNTER — Other Ambulatory Visit: Payer: Self-pay

## 2024-07-28 ENCOUNTER — Other Ambulatory Visit (HOSPITAL_COMMUNITY): Payer: Self-pay

## 2024-07-28 ENCOUNTER — Other Ambulatory Visit: Payer: Self-pay

## 2024-07-30 ENCOUNTER — Other Ambulatory Visit (HOSPITAL_COMMUNITY): Payer: Self-pay

## 2024-07-31 ENCOUNTER — Other Ambulatory Visit (HOSPITAL_COMMUNITY): Payer: Self-pay

## 2024-07-31 MED ORDER — GLIPIZIDE 10 MG PO TABS
10.0000 mg | ORAL_TABLET | Freq: Every day | ORAL | 0 refills | Status: DC
  Filled 2024-07-31 – 2024-09-27 (×7): qty 30, 30d supply, fill #0

## 2024-08-01 ENCOUNTER — Other Ambulatory Visit (HOSPITAL_COMMUNITY): Payer: Self-pay

## 2024-08-03 ENCOUNTER — Other Ambulatory Visit (HOSPITAL_COMMUNITY): Payer: Self-pay

## 2024-08-03 ENCOUNTER — Other Ambulatory Visit: Payer: Self-pay

## 2024-08-03 MED ORDER — ALPRAZOLAM 0.5 MG PO TABS
0.5000 mg | ORAL_TABLET | Freq: Two times a day (BID) | ORAL | 1 refills | Status: DC
  Filled 2024-08-28: qty 180, 90d supply, fill #0

## 2024-08-03 MED ORDER — METOPROLOL SUCCINATE ER 100 MG PO TB24
100.0000 mg | ORAL_TABLET | Freq: Every day | ORAL | 1 refills | Status: DC
  Filled 2024-08-03: qty 90, 90d supply, fill #0

## 2024-08-03 MED ORDER — OLMESARTAN MEDOXOMIL 40 MG PO TABS
40.0000 mg | ORAL_TABLET | Freq: Every day | ORAL | 1 refills | Status: AC
  Filled 2024-08-03 – 2024-08-14 (×2): qty 90, 90d supply, fill #0
  Filled 2024-11-17: qty 90, 90d supply, fill #1

## 2024-08-03 MED ORDER — AMLODIPINE BESYLATE 10 MG PO TABS
10.0000 mg | ORAL_TABLET | Freq: Every day | ORAL | 1 refills | Status: DC
  Filled 2024-08-03: qty 90, 90d supply, fill #0

## 2024-08-03 MED ORDER — HYDROCHLOROTHIAZIDE 50 MG PO TABS
50.0000 mg | ORAL_TABLET | Freq: Every day | ORAL | 1 refills | Status: AC
  Filled 2024-08-03: qty 90, 90d supply, fill #0

## 2024-08-03 MED ORDER — POTASSIUM CHLORIDE CRYS ER 20 MEQ PO TBCR
20.0000 meq | EXTENDED_RELEASE_TABLET | Freq: Every day | ORAL | 3 refills | Status: DC
  Filled 2024-08-03: qty 90, 90d supply, fill #0

## 2024-08-03 MED ORDER — PANTOPRAZOLE SODIUM 40 MG PO TBEC
40.0000 mg | DELAYED_RELEASE_TABLET | Freq: Every day | ORAL | 3 refills | Status: AC
  Filled 2024-08-03 – 2024-08-22 (×3): qty 90, 90d supply, fill #0
  Filled 2024-12-05: qty 90, 90d supply, fill #1

## 2024-08-04 ENCOUNTER — Other Ambulatory Visit (HOSPITAL_COMMUNITY): Payer: Self-pay

## 2024-08-07 NOTE — Progress Notes (Signed)
 [facility] - Clinic Note  08/08/2024   CHIEF COMPLAINT Patient presents for Retina Follow Up  HISTORY OF PRESENT ILLNESS: [name] is a 63 y.o. female who presents to the clinic today for:  HPI     Retina Follow Up   Patient presents with  Other.  In right eye.  This started 4 weeks ago.  I, the attending physician,  performed the HPI with the patient and updated documentation appropriately.        Comments   Patient here for 4 weeks retina follow up for CME OD. Patient states vision doing pretty good. No eye pain.       Last edited by Valdemar Rogue, MD on 08/08/2024  1:30 PM.    Pt states [facility] is doing well. Pt reports compliance w/ gtts, durezol  and ketorolac  QID OD.  Referring physician: Harrie Agent, MD [address] [place],  [place] [address]  HISTORICAL INFORMATION:  Selected notes from the MEDICAL RECORD NUMBER Referred by Dr. Harrie for macular edema, possible post op CME LEE:  Ocular Hx- PMH-   CURRENT MEDICATIONS: Current Outpatient Medications (Ophthalmic Drugs)  Medication Sig   Difluprednate  0.05 % EMUL Place 1 drop into the right eye 4 (four) times daily.   ketorolac  (ACULAR ) 0.5 % ophthalmic solution Place 1 drop into the right eye 4 (four) times daily.   No current facility-administered medications for this visit. (Ophthalmic Drugs)   Current Outpatient Medications (Other)  Medication Sig   albuterol  (VENTOLIN  HFA) 108 (90 Base) MCG/ACT inhaler Inhale 1-2 puffs into the lungs every 4 (four) hours as needed.   albuterol  (VENTOLIN  HFA) 108 (90 Base) MCG/ACT inhaler Inhale 2 puffs into the lungs every 6 (six) hours as needed.   ALPRAZolam  (XANAX ) 0.5 MG tablet Take 1 tablet (0.5 mg total) by mouth 2 (two) times daily.   ALPRAZolam  (XANAX ) 0.5 MG tablet Take 1 tablet (0.5 mg total) by mouth 2 (two) times daily.   amLODipine  (NORVASC ) 10 MG tablet Take 1 tablet (10 mg total) by mouth daily.   amLODipine  (NORVASC ) 10 MG  tablet Take 1 tablet (10 mg total) by mouth daily.   amoxicillin -clavulanate (AUGMENTIN ) 875-125 MG tablet Take 1 tablet by mouth every 12 (twelve) hours.   aspirin  EC 81 MG tablet Take 81 mg by mouth daily.   clindamycin  (CLEOCIN  T) 1 % lotion Apply 1 Application topically to face once daily.   clobetasol  (TEMOVATE ) 0.05 % external solution Apply to scalp every other day as needed.   cloNIDine  (CATAPRES -TTS-1) 0.1 mg/24hr patch Place 1 patch (0.1 mg total) onto the skin every 7 (seven) days.   Continuous Glucose Sensor (DEXCOM G7 SENSOR) MISC Change sensor every 10 days.   Continuous Glucose Sensor (FREESTYLE LIBRE 3 PLUS SENSOR) MISC change sensor every 15 days   cyanocobalamin  (,VITAMIN B-12,) 1000 MCG/ML injection Inject 1 ml weekly for 8 weeks, then 1 ml every 2 weeks   doxycycline  (MONODOX ) 100 MG capsule Take 1 capsule (100 mg total) by mouth 2 (two) times daily for 10 days.  Take with a full glass of water  and do not lie down for at least 30 min.   EPINEPHrine  0.3 mg/0.3 mL IJ SOAJ injection Inject 0.3 mg into the muscle as needed for anaphylaxis.   estrogens , conjugated, (PREMARIN ) 0.625 MG tablet Take 1 tablet (0.625 mg total) by mouth daily.   fluconazole  (DIFLUCAN ) 100 MG tablet Take 1 tablet (100 mg total) by mouth daily.  Fluocinolone  Acetonide Scalp (DERMA-SMOOTHE /FS SCALP) 0.01 % OIL Apply to scalp up to 2 (two) times daily as needed.   fluticasone  (FLONASE ) 50 MCG/ACT nasal spray Place 2 sprays into both nostrils Nightly.   fluticasone  (FLONASE ) 50 MCG/ACT nasal spray Place 2 sprays into both nostrils every evening.   glimepiride  (AMARYL ) 4 MG tablet Take 2 tablets (8 mg total) by mouth daily.   glipiZIDE  (GLUCOTROL ) 10 MG tablet Take 1 tablet (10 mg total) by mouth 2 (two) times daily 30 minutes before breakfast and dinner   glipiZIDE  (GLUCOTROL ) 10 MG tablet Take 1 tablet (10 mg total) by mouth daily.   hydrochlorothiazide  (HYDRODIURIL ) 50 MG tablet Take 1 tablet (50 mg total)  by mouth daily.   insulin  glargine, 1 Unit Dial , (TOUJEO  SOLOSTAR) 300 UNIT/ML Solostar Pen Inject 10 Units into the skin daily.   Insulin  Pen Needle 32G X 4 MM MISC use as directed for daily injection.   ipratropium (ATROVENT ) 0.06 % nasal spray Place 2 sprays into each nostril 3 (three) times daily.   levocetirizine (XYZAL  ALLERGY 24HR) 5 MG tablet Take 1 tablet (5 mg total) by mouth daily.   levocetirizine (XYZAL ) 5 MG tablet Take 1 tablet (5 mg total) by mouth every evening.   meloxicam  (MOBIC ) 7.5 MG tablet Take 1 tablet (7.5 mg total) by mouth 2 (two) times daily as needed.   metFORMIN  (GLUCOPHAGE ) 500 MG tablet Take 1 tablet (500 mg total) by mouth 2 (two) times daily with a meal   metFORMIN  (GLUCOPHAGE -XR) 500 MG 24 hr tablet Take 1 tablet (500 mg total) by mouth 2 (two) times daily.   methocarbamol  (ROBAXIN ) 500 MG tablet Take 1 tablet (500 mg total) by mouth every 8 (eight) hours as needed for muscle spasms.   metoCLOPramide  (REGLAN ) 5 MG tablet Take 1 tablet (5 mg total) by mouth 3 (three) times daily before meals.   metoprolol  succinate (TOPROL -XL) 100 MG 24 hr tablet Take 1 tablet (100 mg total) by mouth daily.   metoprolol  succinate (TOPROL -XL) 100 MG 24 hr tablet Take 1 tablet (100 mg total) by mouth daily.   olmesartan  (BENICAR ) 40 MG tablet Take 1 tablet (40 mg total) by mouth daily.   ondansetron  (ZOFRAN -ODT) 4 MG disintegrating tablet Take 1 tablet (4 mg total) by mouth every 8 (eight) hours as needed for nausea or vomiting.   pantoprazole  (PROTONIX ) 40 MG tablet Take 1 tablet (40 mg total) by mouth daily.   potassium chloride  SA (KLOR-CON  M) 20 MEQ tablet Take 1 tablet (20 mEq total) by mouth daily.   potassium chloride  SA (KLOR-CON  M) 20 MEQ tablet Take 1 tablet (20 mEq total) by mouth daily.   sitaGLIPtin  (JANUVIA ) 100 MG tablet Take 1 tablet (100 mg total) by mouth daily.   traMADol  (ULTRAM ) 50 MG tablet Take 1 tablet (50 mg total) by mouth every 6 (six) hours as needed.    traZODone  (DESYREL ) 50 MG tablet Take 0.5-1 tablets (25-50 mg total) by mouth at bedtime as needed.   Vibegron  (GEMTESA ) 75 MG TABS Take 1 tablet (75 mg total) by mouth daily.   Vitamin D , Ergocalciferol , (DRISDOL ) 1.25 MG (50000 UNIT) CAPS capsule Take 1 capsule (50,000 Units total) by mouth every 7 (seven) days.   zolpidem  (AMBIEN ) 10 MG tablet Take 1 tablet (10 mg total) by mouth at bedtime as needed for sleep.   No current facility-administered medications for this visit. (Other)   REVIEW OF SYSTEMS: ROS   Positive for: Endocrine, Cardiovascular, Eyes Last edited by  Orval Asberry RAMAN, COA on 08/08/2024  8:28 AM.       ALLERGIES Allergies  Allergen Reactions   Barium-Containing Compounds Itching and Other (See Comments)    Weakness, loss of voice   Cimetidine    Shellfish Allergy Swelling    Tongue swell   Statins Other (See Comments)    Myalgia, muscle weakness   Sulfa Antibiotics Itching and Other (See Comments)    Weakness, loss of voice   Victoza [Liraglutide] Diarrhea and Nausea Only   PAST MEDICAL HISTORY Past Medical History:  Diagnosis Date   Anxiety    Asthma    diagnosed as an adult, mild   Back pain    Diabetes mellitus    Diverticulosis 01/13/2018   Gallbladder problem    GERD (gastroesophageal reflux disease)    Hypertension    Obesity    SOBOE (shortness of breath on exertion)    Past Surgical History:  Procedure Laterality Date   ABDOMINAL HYSTERECTOMY  2008   CARPAL TUNNEL RELEASE Left    CARPAL TUNNEL RELEASE Right 10/01/2021   Procedure: RIGHT CARPAL TUNNEL RELEASE;  Surgeon: Shari Sieving, MD;  Location: [facility];  Service: Orthopedics;  Laterality: Right;   CATARACT EXTRACTION W/PHACO Right 04/10/2024   Procedure: PHACOEMULSIFICATION, CATARACT, WITH IOL INSERTION;  Surgeon: Harrie Agent, MD;  Location: [facility];  Service: Ophthalmology;  Laterality: Right;  CDE 6.83   CESAREAN SECTION  1983   CHOLECYSTECTOMY  2002   SINUS  EXPLORATION  1995   TONSILLECTOMY     TRIGGER FINGER RELEASE Left    thumb   WRIST SURGERY  1979   FAMILY HISTORY Family History  Problem Relation Age of Onset   Hypertension Mother    Diabetes Mother    Stroke Mother    Obesity Mother    Obesity Father    SOCIAL HISTORY Social History   Tobacco Use   Smoking status: Former    Current packs/day: 0.00    Types: Cigarettes    Quit date: 01/20/1989    Years since quitting: 35.5   Smokeless tobacco: Never  Vaping Use   Vaping status: Never Used  Substance Use Topics   Alcohol use: No    Alcohol/week: 0.0 standard drinks of alcohol   Drug use: No       OPHTHALMIC EXAM:  Base Eye Exam     Visual Acuity (Snellen - Linear)       Right Left   Dist cc 20/30 20/25   Dist ph cc 20/25 -1 20/20 -1    Correction: Glasses         Tonometry (Tonopen, 8:26 AM)       Right Left   Pressure 19 17         Pupils       Dark Light Shape React APD   Right 3 2 Round Brisk None   Left 3 2 Round Brisk None         Visual Fields (Counting fingers)       Left Right    Full Full         Extraocular Movement       Right Left    Full, Ortho Full, Ortho         Neuro/Psych     Oriented x3: Yes   Mood/Affect: Normal         Dilation     Both eyes: 1.0% Mydriacyl , 2.5% Phenylephrine  @ 8:26 AM  Slit Lamp and Fundus Exam     Slit Lamp Exam       Right Left   Lids/Lashes Dermatochalasis - upper lid Dermatochalasis - upper lid   Conjunctiva/Sclera mild melanosis mild melanosis   Cornea arcus, well healed cataract wound, trace PEE trace PEE   Anterior Chamber Deep, 1-2+ fine cell and pigment deep and clear   Iris Round and dilated Round and dilated   Lens PC IOL in good position 2-3+ Nuclear sclerosis with brunescence, 3+ Cortical cataract   Anterior Vitreous Vitreous syneresis Vitreous syneresis         Fundus Exam       Right Left   Disc Pink and Sharp Pink and Sharp   C/D Ratio  0.3 0.3   Macula Flat, Blunted foveal reflex, Drusen, RPE mottling, trace cystic changes -- improved, no heme Flat, Good foveal reflex, Drusen, RPE mottling, No heme or edema   Vessels attenuated, mild tortuosity attenuated, mild tortuosity   Periphery Attached, mild mid zonal drusen, No heme Attached, mild mid zonal drusen, No heme           Refraction     Wearing Rx       Sphere Cylinder Axis Add   Right -4.50 +1.50 016 +2.75   Left -5.75 +2.50 167 +2.75           IMAGING AND PROCEDURES  Imaging and Procedures for 08/08/2024  OCT, Retina - OU - Both Eyes       Right Eye Quality was good. Central Foveal Thickness: 281. Progression has improved. Findings include normal foveal contour, no IRF, no SRF, retinal drusen , pigment epithelial detachment, vitreomacular adhesion (Interval improvement in focal cystic changes nasal fovea ).   Left Eye Quality was good. Central Foveal Thickness: 242. Progression has been stable. Findings include normal foveal contour, no IRF, no SRF, retinal drusen , vitreomacular adhesion .   Notes *Images captured and stored on drive  Diagnosis / Impression:  OD: Interval improvement in focal cystic changes nasal fovea  OS: NFP, no IRF / SRF Non-exu ARMD OU  Clinical management:  See below  Abbreviations: NFP - Normal foveal profile. CME - cystoid macular edema. PED - pigment epithelial detachment. IRF - intraretinal fluid. SRF - subretinal fluid. EZ - ellipsoid zone. ERM - epiretinal membrane. ORA - outer retinal atrophy. ORT - outer retinal tubulation. SRHM - subretinal hyper-reflective material. IRHM - intraretinal hyper-reflective material            ASSESSMENT/PLAN:   ICD-10-CM   1. Cystoid macular edema of right eye  H35.351 OCT, Retina - OU - Both Eyes    2. Intermediate stage nonexudative age-related macular degeneration of both eyes  H35.3132     3. Diabetes mellitus type 2 without retinopathy (HCC)  E11.9     4. Long term  (current) use of oral hypoglycemic drugs  Z79.84     5. Essential hypertension  I10     6. Hypertensive retinopathy of both eyes  H35.033     7. Combined forms of age-related cataract of left eye  H25.812     8. Pseudophakia  Z96.1      CME OD - s/p STK OD #1 (07.29.25) - suspect post op CME related to cataract sx on April 28th - diagnosed on 05.29.25 post op visit - started on combo drop QID OD - BCVA OD stable at 20/25 - OCT shows Interval improvement in focal cystic changes nasal fovea at 4 wks - ?  possible DME component - continue difluprednate  drops QID and ketorolac  QID OD - f/u 8 weeks, DFE, OCT  2. Age related macular degeneration, non-exudative, both eyes  - The incidence, anatomy, and pathology of dry AMD, risk of progression, and the AREDS and AREDS 2 study including smoking risks discussed with patient.  - Recommend amsler grid monitoring  - monitor  3,4. Diabetes mellitus, type 2 without retinopathy  - A1c: 8.3 on 06.10.25 - The incidence, risk factors for progression, natural history and treatment options for diabetic retinopathy  were discussed with patient.   - The need for close monitoring of blood glucose, blood pressure, and serum lipids, avoiding cigarette or any type of tobacco, and the need for long term follow up was also discussed with patient. - ? DME component for macular edema OD  5,6. Hypertensive retinopathy OU - discussed importance of tight BP control - monitor  7. Mixed Cataract OS - The symptoms of cataract, surgical options, and treatments and risks were discussed with patient. - discussed diagnosis and progression - under the expert management of Dr. Jacques, pt hesitant to proceed w/ CE OS  8. Pseudophakia OD  - s/p CE/IOL OD (Dr. Harrie, 04.28.25)  - IOL in good position, doing well  - CME as above  - monitor  Ophthalmic Meds Ordered this visit:  No orders of the defined types were placed in this encounter.    Return in about 8  weeks (around 10/03/2024) for CME OD, DFE, OCT.  There are no Patient Instructions on file for this visit.  Explained the diagnoses, plan, and follow up with the patient and they expressed understanding.  Patient expressed understanding of the importance of proper follow up care.   This document serves as a record of services personally performed by Redell JUDITHANN Hans, MD, PhD. It was created on their behalf by Auston Muzzy, COMT. The creation of this record is the provider's dictation and/or activities during the visit.  Electronically signed by: Auston Muzzy, COMT 08/08/24 1:31 PM  This document serves as a record of services personally performed by Redell JUDITHANN Hans, MD, PhD. It was created on their behalf by Almetta Pesa, an ophthalmic technician. The creation of this record is the provider's dictation and/or activities during the visit.    Electronically signed by: Almetta Pesa, OA, 08/08/24  1:31 PM   Redell JUDITHANN Hans, M.D., Ph.D. Diseases & Surgery of the Retina and Vitreous [facility]  I have reviewed the above documentation for accuracy and completeness, and I agree with the above. Redell JUDITHANN Hans, M.D., Ph.D. 08/08/24 1:31 PM   Abbreviations: M myopia (nearsighted); A astigmatism; H hyperopia (farsighted); P presbyopia; Mrx spectacle prescription;  CTL contact lenses; OD right eye; OS left eye; OU both eyes  XT exotropia; ET esotropia; PEK punctate epithelial keratitis; PEE punctate epithelial erosions; DES dry eye syndrome; MGD meibomian gland dysfunction; ATs artificial tears; PFAT's preservative free artificial tears; NSC nuclear sclerotic cataract; PSC posterior subcapsular cataract; ERM epi-retinal membrane; PVD posterior vitreous detachment; RD retinal detachment; DM diabetes mellitus; DR diabetic retinopathy; NPDR non-proliferative diabetic retinopathy; PDR proliferative diabetic retinopathy; CSME clinically significant macular edema; DME diabetic  macular edema; dbh dot blot hemorrhages; CWS cotton wool spot; POAG primary open angle glaucoma; C/D cup-to-disc ratio; HVF humphrey visual field; GVF goldmann visual field; OCT optical coherence tomography; IOP intraocular pressure; BRVO Branch retinal vein occlusion; CRVO central retinal vein occlusion; CRAO central retinal artery occlusion; BRAO branch retinal artery occlusion; RT  retinal tear; SB scleral buckle; PPV pars plana vitrectomy; VH Vitreous hemorrhage; PRP panretinal laser photocoagulation; IVK intravitreal kenalog ; VMT vitreomacular traction; MH Macular hole;  NVD neovascularization of the disc; NVE neovascularization elsewhere; AREDS age related eye disease study; ARMD age related macular degeneration; POAG primary open angle glaucoma; EBMD epithelial/anterior basement membrane dystrophy; ACIOL anterior chamber intraocular lens; IOL intraocular lens; PCIOL posterior chamber intraocular lens; Phaco/IOL phacoemulsification with intraocular lens placement; PRK photorefractive keratectomy; LASIK laser assisted in situ keratomileusis; HTN hypertension; DM diabetes mellitus; COPD chronic obstructive pulmonary disease

## 2024-08-08 ENCOUNTER — Encounter (INDEPENDENT_AMBULATORY_CARE_PROVIDER_SITE_OTHER): Payer: Self-pay | Admitting: Ophthalmology

## 2024-08-08 ENCOUNTER — Encounter (INDEPENDENT_AMBULATORY_CARE_PROVIDER_SITE_OTHER): Admitting: Ophthalmology

## 2024-08-08 ENCOUNTER — Ambulatory Visit (INDEPENDENT_AMBULATORY_CARE_PROVIDER_SITE_OTHER): Admitting: Ophthalmology

## 2024-08-08 DIAGNOSIS — Z961 Presence of intraocular lens: Secondary | ICD-10-CM | POA: Diagnosis not present

## 2024-08-08 DIAGNOSIS — H35351 Cystoid macular degeneration, right eye: Secondary | ICD-10-CM | POA: Diagnosis not present

## 2024-08-08 DIAGNOSIS — I10 Essential (primary) hypertension: Secondary | ICD-10-CM | POA: Diagnosis not present

## 2024-08-08 DIAGNOSIS — H25812 Combined forms of age-related cataract, left eye: Secondary | ICD-10-CM | POA: Diagnosis not present

## 2024-08-08 DIAGNOSIS — Z7984 Long term (current) use of oral hypoglycemic drugs: Secondary | ICD-10-CM

## 2024-08-08 DIAGNOSIS — E119 Type 2 diabetes mellitus without complications: Secondary | ICD-10-CM

## 2024-08-08 DIAGNOSIS — H353132 Nonexudative age-related macular degeneration, bilateral, intermediate dry stage: Secondary | ICD-10-CM | POA: Diagnosis not present

## 2024-08-08 DIAGNOSIS — H35033 Hypertensive retinopathy, bilateral: Secondary | ICD-10-CM | POA: Diagnosis not present

## 2024-08-09 ENCOUNTER — Other Ambulatory Visit (HOSPITAL_COMMUNITY): Payer: Self-pay

## 2024-08-10 ENCOUNTER — Other Ambulatory Visit: Payer: Self-pay

## 2024-08-14 ENCOUNTER — Other Ambulatory Visit (HOSPITAL_COMMUNITY): Payer: Self-pay

## 2024-08-15 ENCOUNTER — Other Ambulatory Visit: Payer: Self-pay

## 2024-08-15 ENCOUNTER — Other Ambulatory Visit (HOSPITAL_COMMUNITY): Payer: Self-pay

## 2024-08-15 MED ORDER — SITAGLIPTIN PHOSPHATE 100 MG PO TABS
100.0000 mg | ORAL_TABLET | Freq: Every day | ORAL | 2 refills | Status: DC
  Filled 2024-08-15: qty 30, 30d supply, fill #0
  Filled 2024-09-18: qty 30, 30d supply, fill #1
  Filled 2024-10-13: qty 30, 30d supply, fill #2

## 2024-08-17 ENCOUNTER — Other Ambulatory Visit (HOSPITAL_COMMUNITY): Payer: Self-pay

## 2024-08-17 MED ORDER — TIMOLOL MALEATE 0.5 % OP SOLN
1.0000 [drp] | Freq: Two times a day (BID) | OPHTHALMIC | 2 refills | Status: AC
  Filled 2024-08-17: qty 5, 50d supply, fill #0
  Filled 2024-10-02: qty 5, 25d supply, fill #1

## 2024-08-21 ENCOUNTER — Other Ambulatory Visit: Payer: Self-pay

## 2024-08-22 ENCOUNTER — Other Ambulatory Visit: Payer: Self-pay

## 2024-08-22 ENCOUNTER — Other Ambulatory Visit (HOSPITAL_COMMUNITY): Payer: Self-pay

## 2024-08-28 ENCOUNTER — Other Ambulatory Visit (HOSPITAL_COMMUNITY): Payer: Self-pay

## 2024-08-28 ENCOUNTER — Other Ambulatory Visit: Payer: Self-pay

## 2024-08-31 ENCOUNTER — Other Ambulatory Visit (HOSPITAL_COMMUNITY): Payer: Self-pay

## 2024-09-04 ENCOUNTER — Other Ambulatory Visit (HOSPITAL_COMMUNITY): Payer: Self-pay

## 2024-09-04 ENCOUNTER — Other Ambulatory Visit: Payer: Self-pay

## 2024-09-12 ENCOUNTER — Other Ambulatory Visit: Payer: Self-pay

## 2024-09-12 ENCOUNTER — Other Ambulatory Visit (HOSPITAL_COMMUNITY)
Admission: RE | Admit: 2024-09-12 | Discharge: 2024-09-12 | Disposition: A | Discharge status: Home / Self Care | Source: Ambulatory Visit | Attending: Home Modifications | Admitting: Home Modifications

## 2024-09-12 DIAGNOSIS — E1165 Type 2 diabetes mellitus with hyperglycemia: Secondary | ICD-10-CM | POA: Insufficient documentation

## 2024-09-12 DIAGNOSIS — M65331 Trigger finger, right middle finger: Secondary | ICD-10-CM | POA: Diagnosis not present

## 2024-09-12 LAB — COMPREHENSIVE METABOLIC PANEL WITH GFR
ALT: 21 U/L (ref 0–44)
AST: 22 U/L (ref 15–41)
Albumin: 3.4 g/dL — ABNORMAL LOW (ref 3.5–5.0)
Alkaline Phosphatase: 41 U/L (ref 38–126)
Anion gap: 13 (ref 5–15)
BUN: 10 mg/dL (ref 8–23)
CO2: 27 mmol/L (ref 22–32)
Calcium: 9.1 mg/dL (ref 8.9–10.3)
Chloride: 95 mmol/L — ABNORMAL LOW (ref 98–111)
Creatinine, Ser: 0.46 mg/dL (ref 0.44–1.00)
GFR, Estimated: >60 mL/min (ref >60)
Glucose, Bld: 149 mg/dL — ABNORMAL HIGH (ref 70–99)
Potassium: 3.6 mmol/L (ref 3.5–5.1)
Sodium: 135 mmol/L (ref 135–145)
Total Bilirubin: 0.5 mg/dL (ref 0.0–1.2)
Total Protein: 7 g/dL (ref 6.5–8.1)

## 2024-09-12 LAB — HEMOGLOBIN A1C
Hgb A1c MFr Bld: 7.2 % — ABNORMAL HIGH (ref 4.8–5.6)
Mean Plasma Glucose: 159.94 mg/dL

## 2024-09-13 LAB — MICROALBUMIN / CREATININE URINE RATIO
Creatinine, Urine: 81.3 mg/dL
Microalb Creat Ratio: 174 mg/g{creat} — ABNORMAL HIGH (ref 0–29)
Microalb, Ur: 141.2 ug/mL — ABNORMAL HIGH

## 2024-09-18 ENCOUNTER — Other Ambulatory Visit: Payer: Self-pay

## 2024-09-18 ENCOUNTER — Other Ambulatory Visit (HOSPITAL_COMMUNITY): Payer: Self-pay

## 2024-09-19 ENCOUNTER — Other Ambulatory Visit (HOSPITAL_COMMUNITY): Payer: Self-pay

## 2024-09-19 DIAGNOSIS — I10 Essential (primary) hypertension: Secondary | ICD-10-CM | POA: Diagnosis not present

## 2024-09-19 DIAGNOSIS — E1165 Type 2 diabetes mellitus with hyperglycemia: Secondary | ICD-10-CM | POA: Diagnosis not present

## 2024-09-19 DIAGNOSIS — E785 Hyperlipidemia, unspecified: Secondary | ICD-10-CM | POA: Diagnosis not present

## 2024-09-19 DIAGNOSIS — R809 Proteinuria, unspecified: Secondary | ICD-10-CM | POA: Diagnosis not present

## 2024-09-19 MED ORDER — KERENDIA 20 MG PO TABS
20.0000 mg | ORAL_TABLET | Freq: Every day | ORAL | 3 refills | Status: AC
  Filled 2024-09-19: qty 30, 30d supply, fill #0

## 2024-09-19 NOTE — Progress Notes (Signed)
 [facility] - Clinic Note  10/03/2024   CHIEF COMPLAINT Patient presents for Retina Follow Up  HISTORY OF PRESENT ILLNESS: [name] is a 63 y.o. female who presents to the clinic today for:  HPI     Retina Follow Up   Patient presents with  Other.  In right eye.  This started 8 weeks ago.  Duration of 8.  I, the attending physician,  performed the HPI with the patient and updated documentation appropriately.        Comments   Patient feels the OS vision is doing pretty good. The OD vision comes and goes. She is using Ketorolac  OD QID, Pred OD QID, and Timolol  OD BID.      Last edited by Valdemar Rogue, MD on 10/10/2024  2:47 AM.     [facility]   Referring physician: Harrie Agent, MD [id number] [place],  [place] [address]  HISTORICAL INFORMATION:  Selected notes from the MEDICAL RECORD NUMBER Referred by Dr. Harrie for macular edema, possible post op CME LEE:  Ocular Hx- PMH-   CURRENT MEDICATIONS: Current Outpatient Medications (Ophthalmic Drugs)  Medication Sig   Difluprednate  0.05 % EMUL Place 1 drop into the right eye 4 (four) times daily.   ketorolac  (ACULAR ) 0.5 % ophthalmic solution Place 1 drop into the right eye 4 (four) times daily.   timolol  (TIMOPTIC ) 0.5 % ophthalmic solution Place 1 drop into both eyes every 12 (twelve) hours.   No current facility-administered medications for this visit. (Ophthalmic Drugs)   Current Outpatient Medications (Other)  Medication Sig   albuterol  (VENTOLIN  HFA) 108 (90 Base) MCG/ACT inhaler Inhale 1-2 puffs into the lungs every 4 (four) hours as needed.   albuterol  (VENTOLIN  HFA) 108 (90 Base) MCG/ACT inhaler Inhale 2 puffs into the lungs every 6 (six) hours as needed.   ALPRAZolam  (XANAX ) 0.5 MG tablet Take 1 tablet (0.5 mg total) by mouth 2 (two) times daily.   amLODipine  (NORVASC ) 10 MG tablet Take 1 tablet (10 mg total) by mouth daily.   aspirin  EC 81 MG tablet Take 81 mg by  mouth daily.   clindamycin  (CLEOCIN  T) 1 % lotion Apply 1 Application topically to face once daily.   clobetasol  (TEMOVATE ) 0.05 % external solution Apply to scalp every other day as needed.   Continuous Glucose Sensor (DEXCOM G7 SENSOR) MISC Change sensor every 10 days.   Continuous Glucose Sensor (FREESTYLE LIBRE 3 PLUS SENSOR) MISC change sensor every 15 days   cyanocobalamin  (,VITAMIN B-12,) 1000 MCG/ML injection Inject 1 ml weekly for 8 weeks, then 1 ml every 2 weeks   EPINEPHrine  0.3 mg/0.3 mL IJ SOAJ injection Inject 0.3 mg into the muscle as needed for anaphylaxis.   estrogens , conjugated, (PREMARIN ) 0.625 MG tablet Take 1 tablet (0.625 mg total) by mouth daily.   Finerenone (KERENDIA) 20 MG TABS Take 1 tablet (20 mg total) by mouth daily.   Fluocinolone  Acetonide Scalp (DERMA-SMOOTHE /FS SCALP) 0.01 % OIL Apply to scalp up to 2 (two) times daily as needed.   fluticasone  (FLONASE ) 50 MCG/ACT nasal spray Place 2 sprays into both nostrils Nightly.   fluticasone  (FLONASE ) 50 MCG/ACT nasal spray Place 2 sprays into both nostrils every evening. (Patient not taking: Reported on 10/06/2024)   glimepiride  (AMARYL ) 4 MG tablet Take 2 tablets (8 mg total) by mouth daily. (Patient not taking: Reported on 10/06/2024)   glipiZIDE  (GLUCOTROL ) 10 MG tablet Take 1 tablet (10 mg total) by mouth  2 (two) times daily 30 minutes before breakfast and dinner   hydrochlorothiazide  (HYDRODIURIL ) 50 MG tablet Take 1 tablet (50 mg total) by mouth daily.   insulin  glargine, 1 Unit Dial , (TOUJEO  SOLOSTAR) 300 UNIT/ML Solostar Pen Inject 10 Units into the skin daily.   Insulin  Pen Needle 32G X 4 MM MISC use as directed for daily injection.   metFORMIN  (GLUCOPHAGE -XR) 500 MG 24 hr tablet Take 1 tablet (500 mg total) by mouth 2 (two) times daily.   metoprolol  succinate (TOPROL -XL) 100 MG 24 hr tablet Take 1 tablet (100 mg total) by mouth daily.   olmesartan  (BENICAR ) 40 MG tablet Take 1 tablet (40 mg total) by mouth  daily.   pantoprazole  (PROTONIX ) 40 MG tablet Take 1 tablet (40 mg total) by mouth daily.   potassium chloride  SA (KLOR-CON  M) 20 MEQ tablet Take 1 tablet (20 mEq total) by mouth daily.   sitaGLIPtin  (JANUVIA ) 100 MG tablet Take 1 tablet (100 mg total) by mouth daily.   traMADol  (ULTRAM ) 50 MG tablet Take 1 tablet (50 mg total) by mouth every 6 (six) hours as needed.   Vibegron  (GEMTESA ) 75 MG TABS Take 1 tablet (75 mg total) by mouth daily.   Vitamin D , Ergocalciferol , (DRISDOL ) 1.25 MG (50000 UNIT) CAPS capsule Take 1 capsule (50,000 Units total) by mouth every 7 (seven) days.   zolpidem  (AMBIEN ) 10 MG tablet Take 1 tablet (10 mg total) by mouth at bedtime as needed for sleep.   No current facility-administered medications for this visit. (Other)   REVIEW OF SYSTEMS: ROS   Positive for: Endocrine, Cardiovascular, Eyes Last edited by Myra Wanda SAILOR, COT on 10/03/2024  8:20 AM.        ALLERGIES Allergies  Allergen Reactions   Barium-Containing Compounds Itching and Other (See Comments)    Weakness, loss of voice   Cimetidine    Shellfish Allergy Swelling    Tongue swell   Statins Other (See Comments)    Myalgia, muscle weakness   Sulfa Antibiotics Itching and Other (See Comments)    Weakness, loss of voice   Victoza [Liraglutide] Diarrhea and Nausea Only   PAST MEDICAL HISTORY Past Medical History:  Diagnosis Date   Anxiety    Asthma    diagnosed as an adult, mild   Back pain    Diabetes mellitus    Diverticulosis 01/13/2018   Gallbladder problem    GERD (gastroesophageal reflux disease)    Hypertension    Obesity    SOBOE (shortness of breath on exertion)    Past Surgical History:  Procedure Laterality Date   ABDOMINAL HYSTERECTOMY  2008   CARPAL TUNNEL RELEASE Left    CARPAL TUNNEL RELEASE Right 10/01/2021   Procedure: RIGHT CARPAL TUNNEL RELEASE;  Surgeon: Shari Sieving, MD;  Location: [facility];  Service: Orthopedics;  Laterality:  Right;   CATARACT EXTRACTION W/PHACO Right 04/10/2024   Procedure: PHACOEMULSIFICATION, CATARACT, WITH IOL INSERTION;  Surgeon: Harrie Agent, MD;  Location: [facility];  Service: Ophthalmology;  Laterality: Right;  CDE 6.83   CESAREAN SECTION  1983   CHOLECYSTECTOMY  2002   SINUS EXPLORATION  1995   TONSILLECTOMY     TRIGGER FINGER RELEASE Left    thumb   WRIST SURGERY  1979   FAMILY HISTORY Family History  Problem Relation Age of Onset   Hypertension Mother    Diabetes Mother    Stroke Mother    Obesity Mother    Obesity Father    SOCIAL HISTORY  Social History   Tobacco Use   Smoking status: Former    Current packs/day: 0.00    Types: Cigarettes    Quit date: 01/20/1989    Years since quitting: 35.7   Smokeless tobacco: Never  Vaping Use   Vaping status: Never Used  Substance Use Topics   Alcohol use: No    Alcohol/week: 0.0 standard drinks of alcohol   Drug use: No       OPHTHALMIC EXAM:  Base Eye Exam     Visual Acuity (Snellen - Linear)       Right Left   Dist cc 20/30 20/30   Dist ph cc NI NI    Correction: Glasses         Tonometry (Tonopen, 8:23 AM)       Right Left   Pressure 24 20  1  drop of Cosopt OD 8:23A        Pupils       Dark Light Shape React APD   Right 3 2 Round Brisk None   Left 3 2 Round Brisk None         Visual Fields       Left Right    Full Full         Extraocular Movement       Right Left    Full, Ortho Full, Ortho         Neuro/Psych     Oriented x3: Yes   Mood/Affect: Normal         Dilation     Both eyes: 1.0% Mydriacyl , 2.5% Phenylephrine  @ 8:20 AM           Slit Lamp and Fundus Exam     Slit Lamp Exam       Right Left   Lids/Lashes Dermatochalasis - upper lid Dermatochalasis - upper lid   Conjunctiva/Sclera mild melanosis, STK in ST quad mild melanosis   Cornea arcus, well healed cataract wound, 1-2+ PEE 1+ PEE   Anterior Chamber Deep, 0.5+ fine cell and pigment deep and clear    Iris Round and dilated Round and dilated   Lens PC IOL in good position 2-3+ Nuclear sclerosis with brunescence, 3+ Cortical cataract   Anterior Vitreous Vitreous syneresis Vitreous syneresis         Fundus Exam       Right Left   Disc Pink and Sharp Pink and Sharp   C/D Ratio 0.3 0.3   Macula Flat, Blunted foveal reflex, Drusen, RPE mottling, trace cystic changes --slightly increased, no heme Flat, Good foveal reflex, Drusen, RPE mottling, No heme or edema   Vessels attenuated, mild tortuosity, mild copper wiring attenuated, mild tortuosity   Periphery Attached, mild mid zonal drusen, No heme Attached, mild mid zonal drusen, No heme           IMAGING AND PROCEDURES  Imaging and Procedures for 10/03/2024  OCT, Retina - OU - Both Eyes       Right Eye Quality was good. Central Foveal Thickness: 272. Progression has been stable. Findings include normal foveal contour, no IRF, no SRF, retinal drusen , pigment epithelial detachment, vitreomacular adhesion (Persistent focal cystic changes nasal fovea, VMA w/ mild traction centrally).   Left Eye Quality was good. Central Foveal Thickness: 227. Progression has been stable. Findings include normal foveal contour, no IRF, no SRF, retinal drusen , vitreomacular adhesion .   Notes *Images captured and stored on drive  Diagnosis / Impression:  OD: Persistent focal cystic changes  nasal fovea, VMA w/ mild traction centrally OS: NFP, no IRF / SRF Non-exu ARMD OU  Clinical management:  See below  Abbreviations: NFP - Normal foveal profile. CME - cystoid macular edema. PED - pigment epithelial detachment. IRF - intraretinal fluid. SRF - subretinal fluid. EZ - ellipsoid zone. ERM - epiretinal membrane. ORA - outer retinal atrophy. ORT - outer retinal tubulation. SRHM - subretinal hyper-reflective material. IRHM - intraretinal hyper-reflective material           ASSESSMENT/PLAN:   ICD-10-CM   1. Cystoid macular edema of right eye   H35.351 OCT, Retina - OU - Both Eyes    2. Intermediate stage nonexudative age-related macular degeneration of both eyes  H35.3132     3. Diabetes mellitus type 2 without retinopathy (HCC)  E11.9     4. Long term (current) use of oral hypoglycemic drugs  Z79.84     5. Essential hypertension  I10     6. Hypertensive retinopathy of both eyes  H35.033     7. Combined forms of age-related cataract of left eye  H25.812     8. Pseudophakia  Z96.1     9. Dry eyes, bilateral  H04.123      CME OD - s/p STK OD #1 (07.29.25) - suspect post op CME related to cataract sx on April 28th - diagnosed on 05.29.25 post op visit - started on combo drop QID OD - BCVA OD decreased from 20/25 to 20/30 - OCT shows Persistent focal cystic changes nasal fovea, VMA w/ mild traction centrally at 8 wks - ?possible DME component - reduce difluprednate  drops from QID to TID and ketorolac  QID to TID OD - Increase use of artificial tears - IOP elevated to 24  - f/u 6 weeks, DFE, OCT  2. Age related macular degeneration, non-exudative, both eyes  - The incidence, anatomy, and pathology of dry AMD, risk of progression, and the AREDS and AREDS 2 study including smoking risks discussed with patient.  - Recommend amsler grid monitoring  - monitor  3,4. Diabetes mellitus, type 2 without retinopathy  - A1c: 7.2 on 09.30.25, 8.3 on 06.10.25 - The incidence, risk factors for progression, natural history and treatment options for diabetic retinopathy  were discussed with patient.   - The need for close monitoring of blood glucose, blood pressure, and serum lipids, avoiding cigarette or any type of tobacco, and the need for long term follow up was also discussed with patient. - ? DME component for macular edema  OD  5,6. Hypertensive retinopathy OU - discussed importance of tight BP control - monitor  7. Mixed Cataract OS - The symptoms of cataract, surgical options, and treatments and risks were discussed with  patient. - discussed diagnosis and progression - under the expert management of Dr. Jacques, pt hesitant to proceed w/ CE OS  8. Pseudophakia OD  - s/p CE/IOL OD (Dr. Harrie, 04.28.25)  - IOL in good position, doing well  - CME as above  - monitor  9. Dry Eyes OU  - Exam on 10.21.25 shows 1+ PEE OU - Recommend increase use of ATs  Ophthalmic Meds Ordered this visit:  No orders of the defined types were placed in this encounter.    Return in about 6 weeks (around 11/14/2024) for CME OD, DFE, OCT.  There are no Patient Instructions on file for this visit.  Explained the diagnoses, plan, and follow up with the patient and they expressed understanding.  Patient expressed understanding of the  importance of proper follow up care.  This document serves as a record of services personally performed by Redell JUDITHANN Hans, MD, PhD. It was created on their behalf by Wanda GEANNIE Keens, COT an ophthalmic technician. The creation of this record is the provider's dictation and/or activities during the visit.    Electronically signed by:  Wanda GEANNIE Keens, COT  10/10/24 2:49 AM  This document serves as a record of services personally performed by Redell JUDITHANN Hans, MD, PhD. It was created on their behalf by Almetta Pesa, an ophthalmic technician. The creation of this record is the provider's dictation and/or activities during the visit.    Electronically signed by: Almetta Pesa, OA, 10/10/24  2:49 AM   Redell JUDITHANN Hans, M.D., Ph.D. Diseases & Surgery of the Retina and Vitreous [facility]  I have reviewed the above documentation for accuracy and completeness, and I agree with the above. Redell JUDITHANN Hans, M.D., Ph.D. 10/10/24 2:56 AM   Abbreviations: M myopia (nearsighted); A astigmatism; H hyperopia (farsighted); P presbyopia; Mrx spectacle prescription;  CTL contact lenses; OD right eye; OS left eye; OU both eyes  XT exotropia; ET esotropia; PEK punctate epithelial  keratitis; PEE punctate epithelial erosions; DES dry eye syndrome; MGD meibomian gland dysfunction; ATs artificial tears; PFAT's preservative free artificial tears; NSC nuclear sclerotic cataract; PSC posterior subcapsular cataract; ERM epi-retinal membrane; PVD posterior vitreous detachment; RD retinal detachment; DM diabetes mellitus; DR diabetic retinopathy; NPDR non-proliferative diabetic retinopathy; PDR proliferative diabetic retinopathy; CSME clinically significant macular edema; DME diabetic macular edema; dbh dot blot hemorrhages; CWS cotton wool spot; POAG primary open angle glaucoma; C/D cup-to-disc ratio; HVF humphrey visual field; GVF goldmann visual field; OCT optical coherence tomography; IOP intraocular pressure; BRVO Branch retinal vein occlusion; CRVO central retinal vein occlusion; CRAO central retinal artery occlusion; BRAO branch retinal artery occlusion; RT retinal tear; SB scleral buckle; PPV pars plana vitrectomy; VH Vitreous hemorrhage; PRP panretinal laser photocoagulation; IVK intravitreal kenalog ; VMT vitreomacular traction; MH Macular hole;  NVD neovascularization of the disc; NVE neovascularization elsewhere; AREDS age related eye disease study; ARMD age related macular degeneration; POAG primary open angle glaucoma; EBMD epithelial/anterior basement membrane dystrophy; ACIOL anterior chamber intraocular lens; IOL intraocular lens; PCIOL posterior chamber intraocular lens; Phaco/IOL phacoemulsification with intraocular lens placement; PRK photorefractive keratectomy; LASIK laser assisted in situ keratomileusis; HTN hypertension; DM diabetes mellitus; COPD chronic obstructive pulmonary disease

## 2024-09-25 ENCOUNTER — Encounter (HOSPITAL_COMMUNITY): Payer: Self-pay

## 2024-09-25 ENCOUNTER — Other Ambulatory Visit (HOSPITAL_COMMUNITY): Payer: Self-pay

## 2024-09-26 ENCOUNTER — Other Ambulatory Visit (HOSPITAL_COMMUNITY): Payer: Self-pay

## 2024-09-26 ENCOUNTER — Other Ambulatory Visit: Payer: Self-pay

## 2024-09-27 ENCOUNTER — Other Ambulatory Visit (HOSPITAL_COMMUNITY): Payer: Self-pay

## 2024-09-27 ENCOUNTER — Other Ambulatory Visit: Payer: Self-pay

## 2024-09-27 MED ORDER — VIBEGRON 75 MG PO TABS
75.0000 mg | ORAL_TABLET | Freq: Every day | ORAL | 5 refills | Status: DC
  Filled 2024-09-27: qty 30, 30d supply, fill #0
  Filled 2024-10-25 (×3): qty 30, 30d supply, fill #1

## 2024-09-28 ENCOUNTER — Other Ambulatory Visit (HOSPITAL_COMMUNITY): Payer: Self-pay

## 2024-09-28 ENCOUNTER — Other Ambulatory Visit: Payer: Self-pay

## 2024-10-02 ENCOUNTER — Other Ambulatory Visit: Payer: Self-pay

## 2024-10-02 ENCOUNTER — Other Ambulatory Visit (HOSPITAL_COMMUNITY): Payer: Self-pay

## 2024-10-03 ENCOUNTER — Other Ambulatory Visit: Payer: Self-pay

## 2024-10-03 ENCOUNTER — Ambulatory Visit (INDEPENDENT_AMBULATORY_CARE_PROVIDER_SITE_OTHER): Admitting: Ophthalmology

## 2024-10-03 DIAGNOSIS — E119 Type 2 diabetes mellitus without complications: Secondary | ICD-10-CM | POA: Diagnosis not present

## 2024-10-03 DIAGNOSIS — H353132 Nonexudative age-related macular degeneration, bilateral, intermediate dry stage: Secondary | ICD-10-CM

## 2024-10-03 DIAGNOSIS — Z7984 Long term (current) use of oral hypoglycemic drugs: Secondary | ICD-10-CM | POA: Diagnosis not present

## 2024-10-03 DIAGNOSIS — Z961 Presence of intraocular lens: Secondary | ICD-10-CM

## 2024-10-03 DIAGNOSIS — H35351 Cystoid macular degeneration, right eye: Secondary | ICD-10-CM

## 2024-10-03 DIAGNOSIS — I10 Essential (primary) hypertension: Secondary | ICD-10-CM | POA: Diagnosis not present

## 2024-10-03 DIAGNOSIS — H04123 Dry eye syndrome of bilateral lacrimal glands: Secondary | ICD-10-CM | POA: Diagnosis not present

## 2024-10-03 DIAGNOSIS — H35033 Hypertensive retinopathy, bilateral: Secondary | ICD-10-CM

## 2024-10-03 DIAGNOSIS — H25812 Combined forms of age-related cataract, left eye: Secondary | ICD-10-CM | POA: Diagnosis not present

## 2024-10-06 ENCOUNTER — Encounter: Payer: Self-pay | Admitting: Allergy & Immunology

## 2024-10-06 ENCOUNTER — Other Ambulatory Visit (HOSPITAL_COMMUNITY)
Admission: RE | Admit: 2024-10-06 | Discharge: 2024-10-06 | Disposition: A | Discharge status: Home / Self Care | Source: Ambulatory Visit | Attending: Allergy & Immunology | Admitting: Allergy & Immunology

## 2024-10-06 ENCOUNTER — Ambulatory Visit: Admitting: Allergy & Immunology

## 2024-10-06 VITALS — BP 156/70 | HR 78 | Temp 97.3°F | Ht 63.0 in | Wt 252.2 lb

## 2024-10-06 DIAGNOSIS — Z887 Allergy status to serum and vaccine status: Secondary | ICD-10-CM

## 2024-10-06 DIAGNOSIS — B999 Unspecified infectious disease: Secondary | ICD-10-CM | POA: Insufficient documentation

## 2024-10-06 DIAGNOSIS — J302 Other seasonal allergic rhinitis: Secondary | ICD-10-CM | POA: Diagnosis not present

## 2024-10-06 DIAGNOSIS — J4520 Mild intermittent asthma, uncomplicated: Secondary | ICD-10-CM

## 2024-10-06 DIAGNOSIS — T50Z95A Adverse effect of other vaccines and biological substances, initial encounter: Secondary | ICD-10-CM

## 2024-10-06 DIAGNOSIS — J3089 Other allergic rhinitis: Secondary | ICD-10-CM | POA: Diagnosis not present

## 2024-10-06 LAB — CBC WITH DIFFERENTIAL/PLATELET
Abs Immature Granulocytes: 0.05 K/uL (ref 0.00–0.07)
Basophils Absolute: 0.1 K/uL (ref 0.0–0.1)
Basophils Relative: 1 %
Eosinophils Absolute: 0.8 K/uL — ABNORMAL HIGH (ref 0.0–0.5)
Eosinophils Relative: 8 %
HCT: 38.8 % (ref 36.0–46.0)
Hemoglobin: 13.2 g/dL (ref 12.0–15.0)
Immature Granulocytes: 1 %
Lymphocytes Relative: 27 %
Lymphs Abs: 2.6 K/uL (ref 0.7–4.0)
MCH: 29.9 pg (ref 26.0–34.0)
MCHC: 34 g/dL (ref 30.0–36.0)
MCV: 88 fL (ref 80.0–100.0)
Monocytes Absolute: 0.7 K/uL (ref 0.1–1.0)
Monocytes Relative: 7 %
Neutro Abs: 5.4 K/uL (ref 1.7–7.7)
Neutrophils Relative %: 56 %
Platelets: 314 K/uL (ref 150–400)
RBC: 4.41 MIL/uL (ref 3.87–5.11)
RDW: 13.3 % (ref 11.5–15.5)
WBC: 9.7 K/uL (ref 4.0–10.5)
nRBC: 0 % (ref 0.0–0.2)

## 2024-10-06 NOTE — Progress Notes (Signed)
 NEW PATIENT  Date of Service/Encounter:  10/06/24  Consult requested by: Bertell Satterfield, MD   Assessment:   Adverse effect of vaccine  Mild intermittent asthma, uncomplicated   Seasonal and perennial allergic rhinitis (grasses, weeds, trees, indoor and outdoor molds, dust mites, cat, dog, cockroach)   Recurrent infections  Plan/Recommendations:   1. Adverse effect of vaccine - We are going to get some labs to rule out weird causes of vaccine allergies, including alpha gal, egg, and gelatin. - We are also going to get a tryptase to look for mast cell disease.  - We are going to test to the flu shot next time we see you.   2. Mild intermittent asthma, uncomplicated - Lung testing not done since you are not using your albuterol  frequently.  - We can look at it in the future if needed.   3. Seasonal and perennial allergic rhinitis - Because of insurance stipulations, we cannot do skin testing on the same day as your first visit. - We are all working to fight this, but for now we need to do two separate visits.  - We will know more after we do testing at the next visit.  - The skin testing visit can be squeezed in at your convenience.  - Then we can make a more full plan to address all of your symptoms. - Be sure to stop your antihistamines for 3 days before this appointment.   4. Recurrent infections - I am going to re-do your immune workup since you are getting so many antibiotics. - We will obtain some screening labs to evaluate your immune system.  - Labs to evaluate the quantitative [facility]) aspects of your immune system: IgG/IgA/IgM, CBC with differential - Labs to evaluate the qualitative (HOW WELL THEY WORK) aspects of your immune system: CH50, Pneumococcal titers, Tetanus titers, Diphtheria titers - We may consider immunizations with Pneumovax and Tdap to challenge your immune system, and then obtain repeat titers in 4-6 weeks.   5. Return in about 1 week (around  10/13/2024) for ALLERGY TESTING (1-55 + flu vaccine). You can have the follow up appointment with Dr. Iva or a Nurse Practicioner (our Nurse Practitioners are excellent and always have Physician oversight!).   This note in its entirety was forwarded to the Provider who requested this consultation.  Subjective:   [name] is a 63 y.o. female presenting today for evaluation of  Chief Complaint  Patient presents with   Establish Care    Pt is here today to establish care. 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 She is here to determine if she is able to get the flu shot or what needs to be done if she can't due to [facility] policy.     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  Discussed the use of AI scribe software for clinical note transcription with the patient and/or guardian, who gave verbal consent to proceed.  Sharlet JAYSON Converse was referred by Bertell Satterfield, MD.     [name] is a 63 y.o. female presenting for an evaluation of a possible flu vaccine reaction.  She experienced nausea about an hour after receiving the flu shot, followed by sweating, itching, and fainting. She was unable to eat due to nausea and  took Tylenol  to manage her symptoms. Her daughter assisted her when she fainted, and she was eventually taken to the ER.  She has a history of similar reactions to other vaccines, including the COVID vaccine, which caused her to be out of work for three days. She also reported a severe reaction to barium sulfate, which required hospitalization.  Asthma/Respiratory Symptom History: She has asthma, for which she uses an inhaler and has an EpiPen . Her asthma is currently well-controlled, and she has not used her inhaler recently.   Allergic Rhinitis Symptom History: She has a history of environmental allergies and is allergic to sulfa drugs. She experiences swelling from insect stings and has had reactions to allergy shots in the past, which caused her face to break out. She manages her allergies with medications such as Allegra, Singulair , and levocetirizine, and uses Benadryl as needed.  GERD Symptom History: She also has reflux, managed with Protonix , which is effective.  Infection Symptom History: She has a history of frequent ear infections due to small ear canals, which are managed by an ENT specialist. She has previously been on frequent antibiotics but has not taken any recently. She had an immune workup when I saw her last in 2019.   Component     Latest Ref Rng 11/04/2018  Pneumo Ab Type 1*     >1.3 ug/mL 5.8   Pneumo Ab Type 3*     >1.3 ug/mL 1.1 (L)   Pneumo Ab Type 4*     >1.3 ug/mL 9.5   Pneumo Ab Type 8*     >1.3 ug/mL 1.3 (L)   Pneumo Ab Type 9 (9N)*     >1.3 ug/mL 1.5   Pneumo Ab Type 12 (576F)*     >1.3 ug/mL 0.4 (L)   Pneumo Ab Type 14*     >1.3 ug/mL >17.4   Pneumo Ab Type 17 (76F)*     >1.3 ug/mL 0.8 (L)   Pneumo Ab Type 19 (98F)*     >1.3 ug/mL 6.1   Pneumo Ab Type 2*     >1.3 ug/mL 20.2   Pneumo Ab Type 20*     >1.3 ug/mL 5.7   Pneumo Ab Type 22 (476F)*     >1.3 ug/mL 0.5 (L)   Pneumo Ab Type 23 (53F)*     >1.3 ug/mL 0.5 (L)   Pneumo Ab Type 26 (6B)*      >1.3 ug/mL 3.6   Pneumo Ab Type 34 (10A)*     >1.3 ug/mL 0.4 (L)   Pneumo Ab Type 43 (11A)*     >1.3 ug/mL 0.8 (L)   Pneumo Ab Type 5*     >1.3 ug/mL 2.3   Pneumo Ab Type 51 (76F)*     >1.3 ug/mL 3.3   Pneumo Ab Type 54 (15B)*     >1.3 ug/mL 5.6   Pneumo Ab Type 56 (18C)*     >1.3 ug/mL 6.3   Pneumo Ab Type 57 (  19A)*     >1.3 ug/mL 9.6   Pneumo Ab Type 68 (9V)*     >1.3 ug/mL >7.6   Pneumo Ab Type 70 (13F)*     >1.3 ug/mL >6.6   IgG (Immunoglobin G), Serum     700 - 1,600 mg/dL 8,685   IgA/Immunoglobulin A, Serum     87 - 352 mg/dL 792   IgM (Immunoglobulin M), Srm     26 - 217 mg/dL 19 (L)   Tetanus Ab, IgG     <0.10 IU/mL >7.00   Diphtheria Ab     <0.10 IU/mL >3.00   Compl, Total (CH50)     42 - 999999 U/mL >60     Otherwise, there is no history of other atopic diseases, including drug allergies, stinging insect allergies, or contact dermatitis. There is no significant infectious history. Vaccinations are up to date.    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/06/2020   Vitamin D  deficiency 09/06/2020   Uncontrolled type 2 diabetes mellitus with hyperglycemia (HCC) 09/06/2020   Statin intolerance 09/06/2020   Mild intermittent asthma, uncomplicated 01/20/2019   Seasonal and perennial allergic rhinitis 01/20/2019   Itching 01/20/2019   Diverticulosis 01/13/2018   Intractable nausea and vomiting 12/25/2016   Diabetes (HCC) 09/02/2016   Asthma 09/02/2016   MORBID OBESITY 07/02/2009   Essential hypertension, benign 07/02/2009   CHEST DISCOMFORT 07/02/2009    Medication List:  Allergies as of 10/06/2024       Reactions   Barium-containing Compounds Itching, Other (See Comments)   Weakness, loss of voice   Cimetidine    Shellfish Allergy Swelling   Tongue swell   Statins Other (See Comments)   Myalgia, muscle weakness   Sulfa Antibiotics Itching, Other (See Comments)   Weakness, loss of  voice   Victoza [liraglutide] Diarrhea, Nausea Only        Medication List        Accurate as of October 06, 2024 11:59 PM. If you have any questions, ask your nurse or doctor.          STOP taking these medications    amoxicillin -clavulanate 875-125 MG tablet Commonly known as: AUGMENTIN  Stopped by: Marty Morton Shaggy   cloNIDine  0.1 mg/24hr patch Commonly known as: Catapres -TTS-1 Stopped by: Marty Morton Shaggy   doxycycline  100 MG capsule Commonly known as: MONODOX  Stopped by: Marty Morton Shaggy   fluconazole  100 MG tablet Commonly known as: Diflucan  Stopped by: Marty Morton Shaggy   ipratropium 0.06 % nasal spray Commonly known as: ATROVENT  Stopped by: Marty Morton Shaggy   levocetirizine 5 MG tablet Commonly known as: XYZAL  Stopped by: Marty Morton Shaggy   meloxicam  7.5 MG tablet Commonly known as: MOBIC  Stopped by: Marty Morton Shaggy   methocarbamol  500 MG tablet Commonly known as: ROBAXIN  Stopped by: Marty Morton Shaggy   metoCLOPramide  5 MG tablet Commonly known as: REGLAN  Stopped by: Marty Morton Shaggy   ondansetron  4 MG disintegrating tablet Commonly known as: ZOFRAN -ODT Stopped by: Zarielle Cea Louis Dshawn Mcnay   traZODone  50 MG tablet Commonly known as: DESYREL  Stopped by: Marty Morton Shaggy       TAKE these medications    albuterol  108 (90 Base) MCG/ACT inhaler Commonly known as: VENTOLIN  HFA Inhale 1-2 puffs into the lungs every 4 (four) hours as needed.   albuterol  108 (90 Base) MCG/ACT inhaler Commonly known as: VENTOLIN  HFA Inhale 2 puffs into the lungs every 6 (six) hours as needed.  ALPRAZolam  0.5 MG tablet Commonly known as: XANAX  Take 1 tablet (0.5 mg total) by mouth 2 (two) times daily. What changed: Another medication with the same name was removed. Continue taking this medication, and follow the directions you see here. Changed by: Marty Morton Shaggy   amLODipine  10 MG tablet Commonly known as:  NORVASC  Take 1 tablet (10 mg total) by mouth daily. What changed: Another medication with the same name was removed. Continue taking this medication, and follow the directions you see here. Changed by: Marty Morton Shaggy   aspirin  EC 81 MG tablet Take 81 mg by mouth daily.   clindamycin  1 % lotion Commonly known as: CLEOCIN  T Apply 1 Application topically to face once daily.   clobetasol  0.05 % external solution Commonly known as: TEMOVATE  Apply to scalp every other day as needed.   cyanocobalamin  1000 MCG/ML injection Commonly known as: VITAMIN B12 Inject 1 ml weekly for 8 weeks, then 1 ml every 2 weeks   Difluprednate  0.05 % Emul Place 1 drop into the right eye 4 (four) times daily.   EPINEPHrine  0.3 mg/0.3 mL Soaj injection Commonly known as: EPI-PEN Inject 0.3 mg into the muscle as needed for anaphylaxis.   Fluocinolone  Acetonide Scalp 0.01 % Oil Commonly known as: Derma-Smoothe /FS Scalp Apply to scalp up to 2 (two) times daily as needed.   fluticasone  50 MCG/ACT nasal spray Commonly known as: FLONASE  Place 2 sprays into both nostrils Nightly.   fluticasone  50 MCG/ACT nasal spray Commonly known as: FLONASE  Place 2 sprays into both nostrils every evening.   FreeStyle Libre 3 Plus Sensor Misc change sensor every 15 days   Dexcom G7 Sensor Misc Change sensor every 10 days.   Gemtesa  75 MG Tabs Generic drug: Vibegron  Take 1 tablet (75 mg total) by mouth daily.   glimepiride  4 MG tablet Commonly known as: AMARYL  Take 2 tablets (8 mg total) by mouth daily.   glipiZIDE  10 MG tablet Commonly known as: GLUCOTROL  Take 1 tablet (10 mg total) by mouth 2 (two) times daily 30 minutes before breakfast and dinner What changed: Another medication with the same name was removed. Continue taking this medication, and follow the directions you see here. Changed by: Marty Morton Shaggy   hydrochlorothiazide  50 MG tablet Commonly known as: HYDRODIURIL  Take 1 tablet (50 mg  total) by mouth daily.   Januvia  100 MG tablet Generic drug: sitaGLIPtin  Take 1 tablet (100 mg total) by mouth daily.   Kerendia 20 MG Tabs Generic drug: Finerenone Take 1 tablet (20 mg total) by mouth daily.   ketorolac  0.5 % ophthalmic solution Commonly known as: ACULAR  Place 1 drop into the right eye 4 (four) times daily.   metFORMIN  500 MG 24 hr tablet Commonly known as: GLUCOPHAGE -XR Take 1 tablet (500 mg total) by mouth 2 (two) times daily. What changed: Another medication with the same name was removed. Continue taking this medication, and follow the directions you see here. Changed by: Marty Morton Shaggy   metoprolol  succinate 100 MG 24 hr tablet Commonly known as: TOPROL -XL Take 1 tablet (100 mg total) by mouth daily. What changed: Another medication with the same name was removed. Continue taking this medication, and follow the directions you see here. Changed by: Marty Morton Shaggy   olmesartan  40 MG tablet Commonly known as: BENICAR  Take 1 tablet (40 mg total) by mouth daily.   pantoprazole  40 MG tablet Commonly known as: PROTONIX  Take 1 tablet (40 mg total) by mouth daily.   potassium chloride   SA 20 MEQ tablet Commonly known as: KLOR-CON  M Take 1 tablet (20 mEq total) by mouth daily. What changed: Another medication with the same name was removed. Continue taking this medication, and follow the directions you see here. Changed by: Marty Morton Shaggy   Premarin  0.625 MG tablet Generic drug: estrogens  (conjugated) Take 1 tablet (0.625 mg total) by mouth daily.   TechLite Pen Needles 32G X 4 MM Misc Generic drug: Insulin  Pen Needle use as directed for daily injection.   timolol  0.5 % ophthalmic solution Commonly known as: TIMOPTIC  Place 1 drop into both eyes every 12 (twelve) hours.   Toujeo  SoloStar 300 UNIT/ML Solostar Pen Generic drug: insulin  glargine (1 Unit Dial ) Inject 10 Units into the skin daily.   traMADol  50 MG tablet Commonly known  as: ULTRAM  Take 1 tablet (50 mg total) by mouth every 6 (six) hours as needed.   Vitamin D  (Ergocalciferol ) 1.25 MG (50000 UNIT) Caps capsule Commonly known as: DRISDOL  Take 1 capsule (50,000 Units total) by mouth every 7 (seven) days.   zolpidem  10 MG tablet Commonly known as: Ambien  Take 1 tablet (10 mg total) by mouth at bedtime as needed for sleep.        Birth History: non-contributory  Developmental History: non-contributory  Past Surgical History: Past Surgical History:  Procedure Laterality Date   ABDOMINAL HYSTERECTOMY  2008   CARPAL TUNNEL RELEASE Left    CARPAL TUNNEL RELEASE Right 10/01/2021   Procedure: RIGHT CARPAL TUNNEL RELEASE;  Surgeon: Shari Sieving, MD;  Location:  [facility];  Service: Orthopedics;  Laterality: Right;   CATARACT EXTRACTION W/PHACO Right 04/10/2024   Procedure: PHACOEMULSIFICATION, CATARACT, WITH IOL INSERTION;  Surgeon: Harrie Agent, MD;  Location: [facility];  Service: Ophthalmology;  Laterality: Right;  CDE 6.83   CESAREAN SECTION  1983   CHOLECYSTECTOMY  2002   SINUS EXPLORATION  1995   TONSILLECTOMY     TRIGGER FINGER RELEASE Left    thumb   WRIST SURGERY  1979     Family History: Family History  Problem Relation Age of Onset   Hypertension Mother    Diabetes Mother    Stroke Mother    Obesity Mother    Obesity Father      Social History: [name] lives at home with her family.  She lives in a house that is 65 years old.  There is carpeting throughout the home.  She has gas heating and window units for cooling.  There are no dust mite covers on the bedding.  There is no tobacco exposure.  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  There is no fume, chemical, or dust exposure.  There is no HEPA filter in the home.  [place] do not [place] near an interstate or industrial area.   Review of systems otherwise negative other than that mentioned in the HPI.    Objective:   Blood pressure  (!) 156/70, pulse 78, temperature (!) 97.3 F (36.3 C), temperature source Temporal, height 5' 3 (1.6 m), weight 252 lb 3.2 oz (114.4 kg), SpO2 98%. Body mass index is 44.68 kg/m.     Physical Exam Vitals reviewed.  Constitutional:      Appearance: She is well-developed and overweight. She is not ill-appearing or toxic-appearing.  HENT:     Head: Normocephalic and atraumatic.     Right Ear: Tympanic membrane, ear canal and external ear normal. No drainage, swelling or tenderness. Tympanic membrane is not injected, scarred,  erythematous, retracted or bulging.     Left Ear: Tympanic membrane, ear canal and external ear normal. No drainage, swelling or tenderness. Tympanic membrane is not injected, scarred, erythematous, retracted or bulging.     Nose: No nasal deformity, septal deviation, mucosal edema or rhinorrhea.     Right Turbinates: Enlarged, swollen and pale.     Left Turbinates: Enlarged, swollen and pale.     Right Sinus: No maxillary sinus tenderness or frontal sinus tenderness.     Left Sinus: No maxillary sinus tenderness or frontal sinus tenderness.     Mouth/Throat:     Mouth: Mucous membranes are not pale and not dry.     Pharynx: Uvula midline.  Eyes:     General:        Right eye: No discharge.        Left eye: No discharge.     Conjunctiva/sclera: Conjunctivae normal.     Right eye: Right conjunctiva is not injected. No chemosis.    Left eye: Left conjunctiva is not injected. No chemosis.    Pupils: Pupils are equal, round, and reactive to light.  Cardiovascular:     Rate and Rhythm: Normal rate and regular rhythm.     Heart sounds: Normal heart sounds.  Pulmonary:     Effort: Pulmonary effort is normal. No tachypnea, accessory muscle usage or respiratory distress.     Breath sounds: Normal breath sounds. No wheezing, rhonchi or rales.  Chest:     Chest wall: No tenderness.  Abdominal:     Tenderness: There is no abdominal tenderness. There is no guarding or  rebound.  Lymphadenopathy:     Head:     Right side of head: No submandibular, tonsillar or occipital adenopathy.     Left side of head: No submandibular, tonsillar or occipital adenopathy.     Cervical: No cervical adenopathy.  Skin:    Coloration: Skin is not pale.     Findings: No abrasion, erythema, petechiae or rash. Rash is not papular, urticarial or vesicular.  Neurological:     Mental Status: She is alert.  Psychiatric:        Behavior: Behavior is cooperative.      Diagnostic studies: labs sent instead           Marty Shaggy, MD [facility] 

## 2024-10-06 NOTE — Patient Instructions (Addendum)
 1. Adverse effect of vaccine - We are going to get some labs to rule out weird causes of vaccine allergies, including alpha gal, egg, and gelatin. - We are also going to get a tryptase to look for mast cell disease.  - We are going to test to the flu shot next time we see you.   2. Mild intermittent asthma, uncomplicated - Lung testing not done since you are not using your albuterol  frequently.  - We can look at it in the future if needed.   3. Seasonal and perennial allergic rhinitis - Because of insurance stipulations, we cannot do skin testing on the same day as your first visit. - We are all working to fight this, but for now we need to do two separate visits.  - We will know more after we do testing at the next visit.  - The skin testing visit can be squeezed in at your convenience.  - Then we can make a more full plan to address all of your symptoms. - Be sure to stop your antihistamines for 3 days before this appointment.   4. Recurrent infections - I am going to re-do your immune workup since you are getting so many antibiotics. - We will obtain some screening labs to evaluate your immune system.  - Labs to evaluate the quantitative [facility]) aspects of your immune system: IgG/IgA/IgM, CBC with differential - Labs to evaluate the qualitative (HOW WELL THEY WORK) aspects of your immune system: CH50, Pneumococcal titers, Tetanus titers, Diphtheria titers - We may consider immunizations with Pneumovax and Tdap to challenge your immune system, and then obtain repeat titers in 4-6 weeks.   5. Return in about 1 week (around 10/13/2024) for ALLERGY TESTING (1-55 + flu vaccine). You can have the follow up appointment with Dr. Iva or a Nurse Practicioner (our Nurse Practitioners are excellent and always have Physician oversight!).    Please inform us  of any Emergency Department visits, hospitalizations, or changes in symptoms. Call us  before going to the ED for breathing or allergy  symptoms since we might be able to fit you in for a sick visit. Feel free to contact us  anytime with any questions, problems, or concerns.  It was a pleasure to see you again today!  Websites that have reliable patient information: 1. American Academy of Asthma, Allergy, and Immunology: www.aaaai.org 2. Food Allergy Research and Education (FARE): foodallergy.org 3. Mothers of Asthmatics: http://www.asthmacommunitynetwork.org 4. American College of Allergy, Asthma, and Immunology: www.acaai.org      "Like" us  on Facebook and Instagram for our latest updates!      A healthy democracy works best when [employer] participate! Make sure you are registered to vote! If you have moved or changed any of your contact information, you will need to get this updated before voting! Scan the QR codes below to learn more!

## 2024-10-07 LAB — IGG, IGA, IGM
IgA: 185 mg/dL (ref 87–352)
IgG (Immunoglobin G), Serum: 1203 mg/dL (ref 586–1602)
IgM (Immunoglobulin M), Srm: 17 mg/dL — ABNORMAL LOW (ref 26–217)

## 2024-10-08 LAB — MISC LABCORP TEST (SEND OUT)
Labcorp test code: 650003
Labcorp test code: 602843
Labcorp test code: 603833

## 2024-10-09 LAB — MISC LABCORP TEST (SEND OUT): Labcorp test code: 163253

## 2024-10-10 ENCOUNTER — Encounter (INDEPENDENT_AMBULATORY_CARE_PROVIDER_SITE_OTHER): Payer: Self-pay | Admitting: Ophthalmology

## 2024-10-10 LAB — COMPLEMENT, TOTAL: Compl, Total (CH50): >60 U/mL (ref >41)

## 2024-10-11 ENCOUNTER — Ambulatory Visit: Payer: Self-pay | Admitting: Allergy & Immunology

## 2024-10-11 ENCOUNTER — Encounter: Payer: Self-pay | Admitting: Allergy & Immunology

## 2024-10-11 ENCOUNTER — Ambulatory Visit: Admitting: Allergy & Immunology

## 2024-10-11 DIAGNOSIS — T50Z95A Adverse effect of other vaccines and biological substances, initial encounter: Secondary | ICD-10-CM

## 2024-10-11 DIAGNOSIS — B999 Unspecified infectious disease: Secondary | ICD-10-CM

## 2024-10-11 DIAGNOSIS — Z887 Allergy status to serum and vaccine status: Secondary | ICD-10-CM

## 2024-10-11 DIAGNOSIS — J302 Other seasonal allergic rhinitis: Secondary | ICD-10-CM

## 2024-10-11 DIAGNOSIS — J3089 Other allergic rhinitis: Secondary | ICD-10-CM | POA: Diagnosis not present

## 2024-10-11 DIAGNOSIS — J4520 Mild intermittent asthma, uncomplicated: Secondary | ICD-10-CM

## 2024-10-11 NOTE — Progress Notes (Signed)
 FOLLOW UP  Date of Service/Encounter:  10/11/24   Assessment:   Adverse effect of vaccine - with testing negative to flu shot droplet, gelatin, iron, and alpha gal   Mild intermittent asthma, uncomplicated   Seasonal and perennial allergic rhinitis (grasses, weeds, indoor molds, outdoor molds, dust mites, and cockroach)   Recurrent infections - awaiting Streptococcal titers  Plan/Recommendations:   1. Adverse effect of vaccine - Testing to gelatin, egg, and alpha gal were all NEGATIVE. - Testing to the flu vaccine droplet was NEGATIVE. - I will write a letter for the Committee describing what happened and my findings, so maybe they will accept that. - Otherwise, we can do a graded challenge in the office setting.  - Definitely get TESTED for the flu if you start to have symptoms and we can start you on Tamiflu.   2. Mild intermittent asthma, uncomplicated - Lung testing not done at the last visit - We can look at it in the future if needed.   3. Seasonal and perennial allergic rhinitis - Testing today showed: grasses, weeds, indoor molds, outdoor molds, dust mites, and cockroach - Copy of test results provided.  - Avoidance measures provided. - Continue with: Allegra (fexofenadine) 180mg  tablet once daily and Xyzal  (levocetirizine) 5mg  tablet once daily - You can use an extra dose of the antihistamine, if needed, for breakthrough symptoms.  - Consider nasal saline rinses 1-2 times daily to remove allergens from the nasal cavities as well as help with mucous clearance (this is especially helpful to do before the nasal sprays are given) - Consider allergy shots as a means of long-term control. - Allergy shots re-train and reset the immune system to ignore environmental allergens and decrease the resulting immune response to those allergens (sneezing, itchy watery eyes, runny nose, nasal congestion, etc).    - Allergy shots improve symptoms in 75-85% of patients.  - We can  discuss more at the next appointment if the medications are not working for you.  4. Recurrent infections - We are still waiting for the Streptococcal titers to come back. - Everything else is looking excellent, however.   5. Return in about 3 months (around 01/11/2025). You can have the follow up appointment with Dr. Iva or a Nurse Practicioner (our Nurse Practitioners are excellent and always have Physician oversight!).    Subjective:   MAXENE BYINGTON is a 63 y.o. 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  Discussed the use of AI scribe software for clinical note transcription with the patient and/or guardian, who gave verbal consent to proceed.  Ynez is a 63 y.o. female presenting for skin testing. She was last seen on October 24th. We could not do testing because her insurance company does not cover testing on the  same day as a New Patient visit. She has been off of all antihistamines 3 days in anticipation of the testing.   We did a bunch of labs.  She was protective against tetanus, diphtheria, although the Streptococcus pneumonia titers were not back yet.  Complement activity was normal.  We did a gelatin IgE, alpha gal  IgE, and egg IgE which were all negative.  This was because of her reaction to the flu vaccine.  She experienced two fainting episodes, the first occurring about an hour before the second. During these episodes, she developed hives on her arms and head, accompanied by itching. She had a bowel movement, though she was unsure if it was diarrhea; the ER doctor documented diarrhea. She was treated with Benadryl and felt better by the time EMS arrived.  She has environmental allergies, including reactions to outdoor mold and grass, which she manages with Allegra during the day and Levocetirizine at night. She has not received the flu vaccine for a long time due to previous ability to decline it, but her workplace now requires it, raising concerns about potential allergic reactions. She has received two COVID vaccines but is not currently required to get more.  She has been working at her current job for thirty years and plans to continue for a couple more years until she can draw full benefits at age 62.  No frequent consumption of red meat. Alpha gal panel was negative.   Otherwise, there have been no changes to her past medical history, surgical history, family history, or social history.    Review of systems otherwise negative other than that mentioned in the HPI.    Objective:   There were no vitals taken for this visit. There is no height or weight on file to calculate BMI.    Physical exam deferred since this was a skin testing appointment only.   Diagnostic studies:   Allergy Studies:     Airborne Adult Perc - 10/11/24 1358     Time Antigen Placed 1415    Allergen Manufacturer Jestine    Location Back    Number of Test 56    Panel 1 Select    1. Control-Buffer 50% Glycerol Negative    2. Control-Histamine 2+    3. Bahia 2+    4. Bermuda 2+    5. Johnson 2+    6. [place]  Blue 3+    7. Meadow Fescue 3+    8. Perennial Rye 3+    9. Timothy Negative    10. Ragweed Mix  Negative    11. Cocklebur Negative    12. Plantain,  English Negative    13. Baccharis Negative    14. Dog Fennel Negative    15. Russian Thistle 2+    16. Lamb's Quarters Negative    17. Sheep Sorrell Negative    18. Rough Pigweed 2+    19. Marsh Elder, Rough Negative    20. Mugwort, Common Negative    21. Box, Elder Negative    22. Cedar, red Negative    23. Sweet Gum Negative    24. Pecan Pollen Negative    25. Pine Mix Negative    26. Walnut, Black Pollen Negative    27. Red Mulberry Negative    28. Ash Mix Negative    29. Birch Mix Negative    30. Beech American Negative    31. Cottonwood, Eastern Negative    32. Hickory, White Negative    33. Maple Mix  Negative    34. Oak, Eastern Mix Negative    35. Sycamore Eastern Negative    36. Alternaria Alternata 3+    37. Cladosporium Herbarum 2+    38. Aspergillus Mix Negative    39. Penicillium Mix Negative    40. Bipolaris Sorokiniana (Helminthosporium) 3+    41. Drechslera Spicifera (Curvularia) 3+    42. Mucor Plumbeus Negative    43. Fusarium Moniliforme 2+    44. Aureobasidium Pullulans (pullulara) Negative    45. Rhizopus Oryzae Negative    46. Botrytis Cinera Negative    47. Epicoccum Nigrum Negative    48. Phoma Betae Negative    49. Dust Mite Mix 4+    50. Cat Hair 10,000 BAU/ml Negative    51.  Dog Epithelia Negative    52. Mixed Feathers Negative    53. Horse Epithelia Negative    54. Cockroach, German 3+    55. Tobacco Leaf Negative    2. Other --   Flu Vaccine Droplet: NEG         Allergy testing results were read and interpreted by myself, documented by clinical staff.      Marty Shaggy, MD  [facility] 

## 2024-10-11 NOTE — Patient Instructions (Addendum)
 1. Adverse effect of vaccine - Testing to gelatin, egg, and alpha gal were all NEGATIVE. - Testing to the flu vaccine droplet was NEGATIVE. - I will write a letter for the Committee describing what happened and my findings, so maybe they will accept that. - Otherwise, we can do a graded challenge in the office setting.  - Definitely get TESTED for the flu if you start to have symptoms and we can start you on Tamiflu.   2. Mild intermittent asthma, uncomplicated - Lung testing not done at the last visit - We can look at it in the future if needed.   3. Seasonal and perennial allergic rhinitis - Testing today showed: grasses, weeds, indoor molds, outdoor molds, dust mites, and cockroach - Copy of test results provided.  - Avoidance measures provided. - Continue with: Allegra (fexofenadine) 180mg  tablet once daily and Xyzal  (levocetirizine) 5mg  tablet once daily - You can use an extra dose of the antihistamine, if needed, for breakthrough symptoms.  - Consider nasal saline rinses 1-2 times daily to remove allergens from the nasal cavities as well as help with mucous clearance (this is especially helpful to do before the nasal sprays are given) - Consider allergy shots as a means of long-term control. - Allergy shots re-train and reset the immune system to ignore environmental allergens and decrease the resulting immune response to those allergens (sneezing, itchy watery eyes, runny nose, nasal congestion, etc).    - Allergy shots improve symptoms in 75-85% of patients.  - We can discuss more at the next appointment if the medications are not working for you.  4. Recurrent infections - We are still waiting for the Streptococcal titers to come back. - Everything else is looking excellent, however.   5. Return in about 3 months (around 01/11/2025). You can have the follow up appointment with Dr. Iva or a Nurse Practicioner (our Nurse Practitioners are excellent and always have Physician  oversight!).    Please inform us  of any Emergency Department visits, hospitalizations, or changes in symptoms. Call us  before going to the ED for breathing or allergy symptoms since we might be able to fit you in for a sick visit. Feel free to contact us  anytime with any questions, problems, or concerns.  It was a pleasure to see you again today!  Websites that have reliable patient information: 1. American Academy of Asthma, Allergy, and Immunology: www.aaaai.org 2. Food Allergy Research and Education (FARE): foodallergy.org 3. Mothers of Asthmatics: http://www.asthmacommunitynetwork.org 4. American College of Allergy, Asthma, and Immunology: www.acaai.org      "Like" us  on Facebook and Instagram for our latest updates!      A healthy democracy works best when [employer] participate! Make sure you are registered to vote! If you have moved or changed any of your contact information, you will need to get this updated before voting! Scan the QR codes below to learn more!

## 2024-10-13 ENCOUNTER — Ambulatory Visit: Admitting: Allergy & Immunology

## 2024-10-13 ENCOUNTER — Other Ambulatory Visit: Payer: Self-pay

## 2024-10-13 LAB — MISC LABCORP TEST (SEND OUT): Labcorp test code: 606895

## 2024-10-16 ENCOUNTER — Other Ambulatory Visit: Payer: Self-pay

## 2024-10-17 ENCOUNTER — Other Ambulatory Visit: Payer: Self-pay

## 2024-10-18 LAB — MISC LABCORP TEST (SEND OUT): Labcorp test code: 812166

## 2024-10-20 ENCOUNTER — Other Ambulatory Visit (HOSPITAL_COMMUNITY): Payer: Self-pay

## 2024-10-25 ENCOUNTER — Other Ambulatory Visit (HOSPITAL_BASED_OUTPATIENT_CLINIC_OR_DEPARTMENT_OTHER): Payer: Self-pay

## 2024-10-25 ENCOUNTER — Other Ambulatory Visit (HOSPITAL_COMMUNITY): Payer: Self-pay

## 2024-10-25 ENCOUNTER — Other Ambulatory Visit: Payer: Self-pay

## 2024-10-25 MED ORDER — GEMTESA 75 MG PO TABS
1.0000 | ORAL_TABLET | Freq: Every day | ORAL | 0 refills | Status: AC
  Filled 2024-10-25: qty 90, 90d supply, fill #0

## 2024-10-26 MED ORDER — VIBEGRON 75 MG PO TABS
75.0000 mg | ORAL_TABLET | Freq: Every day | ORAL | 0 refills | Status: AC
  Filled 2024-10-26: qty 30, 30d supply, fill #0

## 2024-10-27 ENCOUNTER — Other Ambulatory Visit (HOSPITAL_BASED_OUTPATIENT_CLINIC_OR_DEPARTMENT_OTHER): Payer: Self-pay

## 2024-10-27 ENCOUNTER — Other Ambulatory Visit (HOSPITAL_COMMUNITY): Payer: Self-pay

## 2024-10-30 NOTE — Progress Notes (Signed)
 [facility] - Clinic Note  11/13/2024   CHIEF COMPLAINT Patient presents for Retina Follow Up  HISTORY OF PRESENT ILLNESS: [name] is a 63 y.o. female who presents to the clinic today for:  HPI     Retina Follow Up   In both eyes.  This started 6 weeks ago.  Duration of 6 weeks.  Since onset it is gradually worsening.  I, the attending physician,  performed the HPI with the patient and updated documentation appropriately.        Comments   Retina eval CME pt is reporting vision seems little blurred she denies any flashes or floaters she is using PF tid OD timolol  bid OD and ketorolac  tid OD pt last reading 151 this am  BP 155/75 this am been running higher lately        Last edited by Valdemar Rogue, MD on 11/13/2024  8:14 PM.    Pt states [facility] went blurry in OD yesterday  Referring physician: Harrie Agent, MD [id number] [place],  [place] [address]  HISTORICAL INFORMATION:  Selected notes from the MEDICAL RECORD NUMBER Referred by Dr. Harrie for macular edema, possible post op CME LEE:  Ocular Hx- PMH-   CURRENT MEDICATIONS: Current Outpatient Medications (Ophthalmic Drugs)  Medication Sig   dorzolamide-timolol  (COSOPT) 2-0.5 % ophthalmic solution Place 1 drop into the right eye 2 (two) times daily.   ketorolac  (ACULAR ) 0.5 % ophthalmic solution Place 1 drop into the right eye 4 (four) times daily.   timolol  (TIMOPTIC ) 0.5 % ophthalmic solution Place 1 drop into both eyes every 12 (twelve) hours.   Difluprednate  0.05 % EMUL Place 1 drop into the right eye in the morning, at noon, and at bedtime.   No current facility-administered medications for this visit. (Ophthalmic Drugs)   Current Outpatient Medications (Other)  Medication Sig   albuterol  (VENTOLIN  HFA) 108 (90 Base) MCG/ACT inhaler Inhale 1-2 puffs into the lungs every 4 (four) hours as needed.   albuterol  (VENTOLIN  HFA) 108 (90 Base) MCG/ACT inhaler Inhale 2 puffs into the  lungs every 6 (six) hours as needed.   ALPRAZolam  (XANAX ) 0.5 MG tablet Take 1 tablet (0.5 mg total) by mouth 2 (two) times daily.   amLODipine  (NORVASC ) 10 MG tablet Take 1 tablet (10 mg total) by mouth daily.   aspirin  EC 81 MG tablet Take 81 mg by mouth daily.   clindamycin  (CLEOCIN  T) 1 % lotion Apply 1 Application topically to face once daily.   clobetasol  (TEMOVATE ) 0.05 % external solution Apply to scalp every other day as needed.   Continuous Glucose Sensor (DEXCOM G7 SENSOR) MISC Change sensor every 10 days.   Continuous Glucose Sensor (FREESTYLE LIBRE 3 PLUS SENSOR) MISC change sensor every 15 days   cyanocobalamin  (,VITAMIN B-12,) 1000 MCG/ML injection Inject 1 ml weekly for 8 weeks, then 1 ml every 2 weeks   EPINEPHrine  0.3 mg/0.3 mL IJ SOAJ injection Inject 0.3 mg into the muscle as needed for anaphylaxis.   estrogens , conjugated, (PREMARIN ) 0.625 MG tablet Take 1 tablet (0.625 mg total) by mouth daily.   Finerenone  (KERENDIA ) 20 MG TABS Take 1 tablet (20 mg total) by mouth daily.   Fluocinolone  Acetonide Scalp (DERMA-SMOOTHE /FS SCALP) 0.01 % OIL Apply to scalp up to 2 (two) times daily as needed.   fluticasone  (FLONASE ) 50 MCG/ACT nasal spray Place 2 sprays into both nostrils Nightly.   fluticasone  (FLONASE ) 50 MCG/ACT nasal spray Place 2 sprays into both  nostrils every evening.   glimepiride  (AMARYL ) 4 MG tablet Take 2 tablets (8 mg total) by mouth daily.   glipiZIDE  (GLUCOTROL ) 10 MG tablet Take 1 tablet (10 mg total) by mouth 2 (two) times daily 30 minutes before breakfast and dinner   hydrochlorothiazide  (HYDRODIURIL ) 50 MG tablet Take 1 tablet (50 mg total) by mouth daily.   insulin  glargine, 1 Unit Dial , (TOUJEO  SOLOSTAR) 300 UNIT/ML Solostar Pen Inject 10 Units into the skin daily.   Insulin  Pen Needle 32G X 4 MM MISC use as directed for daily injection.   labetalol  (NORMODYNE ) 200 MG tablet Take 1 tablet (200 mg total) by mouth 2 (two) times daily for blood pressure.    metFORMIN  (GLUCOPHAGE -XR) 500 MG 24 hr tablet Take 1 tablet (500 mg total) by mouth 2 (two) times daily.   metoprolol  succinate (TOPROL -XL) 100 MG 24 hr tablet Take 1 tablet (100 mg total) by mouth daily.   olmesartan  (BENICAR ) 40 MG tablet Take 1 tablet (40 mg total) by mouth daily.   pantoprazole  (PROTONIX ) 40 MG tablet Take 1 tablet (40 mg total) by mouth daily.   potassium chloride  SA (KLOR-CON  M) 20 MEQ tablet Take 1 tablet (20 mEq total) by mouth daily.   sitaGLIPtin  (JANUVIA ) 100 MG tablet Take 1 tablet (100 mg total) by mouth daily.   traMADol  (ULTRAM ) 50 MG tablet Take 1 tablet (50 mg total) by mouth every 6 (six) hours as needed.   Vibegron  (GEMTESA ) 75 MG TABS Take 1 tablet (75 mg total) by mouth daily.   Vibegron  (GEMTESA ) 75 MG TABS Take 1 tablet (75 mg total) by mouth daily.   Vitamin D , Ergocalciferol , (DRISDOL ) 1.25 MG (50000 UNIT) CAPS capsule Take 1 capsule (50,000 Units total) by mouth every 7 (seven) days.   zolpidem  (AMBIEN ) 10 MG tablet Take 1 tablet (10 mg total) by mouth at bedtime as needed for sleep.   No current facility-administered medications for this visit. (Other)   REVIEW OF SYSTEMS: ROS   Positive for: Endocrine, Cardiovascular, Eyes Last edited by Resa Delon ORN, COT on 11/13/2024  8:25 AM.         ALLERGIES Allergies  Allergen Reactions   Barium-Containing Compounds Itching and Other (See Comments)    Weakness, loss of voice   Cimetidine    Shellfish Allergy  Swelling    Tongue swell   Statins Other (See Comments)    Myalgia, muscle weakness   Sulfa Antibiotics Itching and Other (See Comments)    Weakness, loss of voice   Victoza [Liraglutide] Diarrhea and Nausea Only   PAST MEDICAL HISTORY Past Medical History:  Diagnosis Date   Anxiety    Asthma    diagnosed as an adult, mild   Back pain    Diabetes mellitus    Diverticulosis 01/13/2018   Gallbladder problem    GERD (gastroesophageal reflux disease)    Hypertension     Obesity    SOBOE (shortness of breath on exertion)    Past Surgical History:  Procedure Laterality Date   ABDOMINAL HYSTERECTOMY  2008   CARPAL TUNNEL RELEASE Left    CARPAL TUNNEL RELEASE Right 10/01/2021   Procedure: RIGHT CARPAL TUNNEL RELEASE;  Surgeon: Shari Sieving, MD;  Location: [facility];  Service: Orthopedics;  Laterality: Right;   CATARACT EXTRACTION W/PHACO Right 04/10/2024   Procedure: PHACOEMULSIFICATION, CATARACT, WITH IOL INSERTION;  Surgeon: Harrie Agent, MD;  Location: [facility];  Service: Ophthalmology;  Laterality: Right;  CDE 6.83   CESAREAN SECTION  1983  CHOLECYSTECTOMY  2002   SINUS EXPLORATION  1995   TONSILLECTOMY     TRIGGER FINGER RELEASE Left    thumb   WRIST SURGERY  1979   FAMILY HISTORY Family History  Problem Relation Age of Onset   Hypertension Mother    Diabetes Mother    Stroke Mother    Obesity Mother    Obesity Father    SOCIAL HISTORY Social History   Tobacco Use   Smoking status: Former    Current packs/day: 0.00    Types: Cigarettes    Quit date: 01/20/1989    Years since quitting: 35.8   Smokeless tobacco: Never  Vaping Use   Vaping status: Never Used  Substance Use Topics   Alcohol use: No    Alcohol/week: 0.0 standard drinks of alcohol   Drug use: No       OPHTHALMIC EXAM:  Base Eye Exam     Visual Acuity (Snellen - Linear)       Right Left   Dist cc 20/70 20/25 -2   Dist ph cc 20/30 -1 NI         Tonometry (Tonopen, 8:33 AM)       Right Left   Pressure 26 18  1  gtts brom,dorz OD         Pupils       Pupils Dark Light Shape React APD   Right PERRL 3 2 Round Brisk None   Left PERRL 3 2 Round Brisk None         Visual Fields       Left Right    Full Full         Extraocular Movement       Right Left    Full, Ortho Full, Ortho         Neuro/Psych     Oriented x3: Yes   Mood/Affect: Normal         Dilation     Both eyes: 2.5% Phenylephrine  @ 8:33 AM            Slit Lamp and Fundus Exam     Slit Lamp Exam       Right Left   Lids/Lashes Dermatochalasis - upper lid Dermatochalasis - upper lid   Conjunctiva/Sclera mild melanosis, STK in ST quad mild melanosis   Cornea arcus, well healed cataract wound, 1+ PEE 1+ PEE   Anterior Chamber Deep and clear, no cell or flare deep and clear   Iris Round and dilated Round and dilated   Lens PC IOL in good position 2-3+ Nuclear sclerosis with brunescence, 3+ Cortical cataract   Anterior Vitreous Vitreous syneresis Vitreous syneresis         Fundus Exam       Right Left   Disc Pink and Sharp Pink and Sharp   C/D Ratio 0.3 0.3   Macula Flat, Blunted foveal reflex, Drusen, RPE mottling, trace cystic changes --slightly improved, rare MA Flat, Good foveal reflex, Drusen, RPE mottling, No heme or edema   Vessels attenuated, mild tortuosity, mild copper wiring attenuated, mild tortuosity   Periphery Attached, mild mid zonal drusen, No heme Attached, mild mid zonal drusen, No heme           Refraction     Wearing Rx       Sphere Cylinder Axis Add   Right -4.50 +1.50 016 +2.75   Left -5.75 +2.50 167 +2.75           IMAGING AND PROCEDURES  Imaging and Procedures for 11/13/2024  OCT, Retina - OU - Both Eyes       Right Eye Quality was good. Central Foveal Thickness: 260. Progression has improved. Findings include normal foveal contour, no IRF, no SRF, retinal drusen , pigment epithelial detachment, vitreomacular adhesion (Persistent focal cystic changes nasal fovea--slightly improved, interval release of central VMA/VMT).   Left Eye Quality was good. Central Foveal Thickness: 230. Progression has been stable. Findings include normal foveal contour, no IRF, no SRF, retinal drusen , vitreomacular adhesion .   Notes *Images captured and stored on drive  Diagnosis / Impression:  OD: Persistent focal cystic changes nasal fovea--slightly improved, interval release of central VMA/VMT OS:  NFP, no IRF / SRF Non-exu ARMD OU  Clinical management:  See below  Abbreviations: NFP - Normal foveal profile. CME - cystoid macular edema. PED - pigment epithelial detachment. IRF - intraretinal fluid. SRF - subretinal fluid. EZ - ellipsoid zone. ERM - epiretinal membrane. ORA - outer retinal atrophy. ORT - outer retinal tubulation. SRHM - subretinal hyper-reflective material. IRHM - intraretinal hyper-reflective material            ASSESSMENT/PLAN:   ICD-10-CM   1. Cystoid macular edema of right eye  H35.351 OCT, Retina - OU - Both Eyes    2. Intermediate stage nonexudative age-related macular degeneration of both eyes  H35.3132     3. Diabetes mellitus type 2 without retinopathy (HCC)  E11.9     4. Long term (current) use of oral hypoglycemic drugs  Z79.84     5. Essential hypertension  I10     6. Hypertensive retinopathy of both eyes  H35.033     7. Combined forms of age-related cataract of left eye  H25.812     8. Pseudophakia  Z96.1     9. Dry eyes, bilateral  H04.123       CME OD - s/p STK OD #1 (07.29.25) - suspect post op CME related to cataract sx on April 28th - diagnosed on 05.29.25 post op visit - started on combo drop QID OD - BCVA OD stable at 20/30 - OCT shows Persistent focal cystic changes nasal fovea--slightly improved, interval release of central VMA/VMT at 6 wks - ?possible DME component - IOP elevated to 26 today--?steroid response - continue difluprednate  TID OD  and ketorolac  TID OD - D/C timolol  and begin Cosopt  BID OD--bottle given in office and rx sent  - Continue use of artificial tears - f/u 6 weeks, DFE, OCT  2. Age related macular degeneration, non-exudative, both eyes  - The incidence, anatomy, and pathology of dry AMD, risk of progression, and the AREDS and AREDS 2 study including smoking risks discussed with patient.  - Recommend amsler grid monitoring  - monitor  3,4. Diabetes mellitus, type 2 without retinopathy  - A1c: 7.2  on 09.30.25, 8.3 on 06.10.25 - The incidence, risk factors for progression, natural history and treatment options for diabetic retinopathy  were discussed with patient.   - The need for close monitoring of blood glucose, blood pressure, and serum lipids, avoiding cigarette or any type of tobacco, and the need for long term follow up was also discussed with patient. - ? DME component for macular edema  5,6. Hypertensive retinopathy OU - discussed importance of tight BP control - monitor  7. Mixed Cataract OS - The symptoms of cataract, surgical options, and treatments and risks were discussed with patient. - discussed diagnosis and progression - under the expert management of Dr.  Wrozesk, pt hesitant to proceed w/CE OS  8. Pseudophakia OD  - s/p CE/IOL OD (Dr. Harrie, 04.28.25)  - IOL in good position, doing well  - CME as above  - monitor  9. Dry Eyes OU  - Exam on 10.21.25 shows 1+ PEE OU - Recommend increase use of ATs  Ophthalmic Meds Ordered this visit:  Meds ordered this encounter  Medications   dorzolamide-timolol  (COSOPT) 2-0.5 % ophthalmic solution    Sig: Place 1 drop into the right eye 2 (two) times daily.    Dispense:  10 mL    Refill:  3   Difluprednate  0.05 % EMUL    Sig: Place 1 drop into the right eye in the morning, at noon, and at bedtime.    Dispense:  5 mL    Refill:  3    USE Rx DISCOUNT CARD: $74.93,  APW:[id number],  ERW:[id number],  Group:[id number],  PI:[id number]     Return in about 8 weeks (around 01/08/2025) for CME OD, DFE, OCT.  There are no Patient Instructions on file for this visit.  Explained the diagnoses, plan, and follow up with the patient and they expressed understanding.  Patient expressed understanding of the importance of proper follow up care.   This document serves as a record of services personally performed by Redell JUDITHANN Hans, MD, PhD. It was created on their behalf by Avelina Pereyra, COA an ophthalmic technician. The creation of this record is  the provider's dictation and/or activities during the visit.   Electronically signed by: Avelina GORMAN Pereyra, COT  11/13/24  8:15 PM   This document serves as a record of services personally performed by Redell JUDITHANN Hans, MD, PhD. It was created on their behalf by Almetta Pesa, an ophthalmic technician. The creation of this record is the provider's dictation and/or activities during the visit.    Electronically signed by: Almetta Pesa, OA, 11/13/24  8:15 PM   Redell JUDITHANN Hans, M.D., Ph.D. Diseases & Surgery of the Retina and Vitreous [facility]  I have reviewed the above documentation for accuracy and completeness, and I agree with the above. Redell JUDITHANN Hans, M.D., Ph.D. 11/13/24 8:16 PM   Abbreviations: M myopia (nearsighted); A astigmatism; H hyperopia (farsighted); P presbyopia; Mrx spectacle prescription;  CTL contact lenses; OD right eye; OS left eye; OU both eyes  XT exotropia; ET esotropia; PEK punctate epithelial keratitis; PEE punctate epithelial erosions; DES dry eye syndrome; MGD meibomian gland dysfunction; ATs artificial tears; PFAT's preservative free artificial tears; NSC nuclear sclerotic cataract; PSC posterior subcapsular cataract; ERM epi-retinal membrane; PVD posterior vitreous detachment; RD retinal detachment; DM diabetes mellitus; DR diabetic retinopathy; NPDR non-proliferative diabetic retinopathy; PDR proliferative diabetic retinopathy; CSME clinically significant macular edema; DME diabetic macular edema; dbh dot blot hemorrhages; CWS cotton wool spot; POAG primary open angle glaucoma; C/D cup-to-disc ratio; HVF humphrey visual field; GVF goldmann visual field; OCT optical coherence tomography; IOP intraocular pressure; BRVO Branch retinal vein occlusion; CRVO central retinal vein occlusion; CRAO central retinal artery occlusion; BRAO branch retinal artery occlusion; RT retinal tear; SB scleral buckle; PPV pars plana vitrectomy; VH Vitreous  hemorrhage; PRP panretinal laser photocoagulation; IVK intravitreal kenalog ; VMT vitreomacular traction; MH Macular hole;  NVD neovascularization of the disc; NVE neovascularization elsewhere; AREDS age related eye disease study; ARMD age related macular degeneration; POAG primary open angle glaucoma; EBMD epithelial/anterior basement membrane dystrophy; ACIOL anterior chamber intraocular lens; IOL intraocular lens; PCIOL posterior chamber intraocular lens; Phaco/IOL phacoemulsification with intraocular lens  placement; PRK photorefractive keratectomy; LASIK laser assisted in situ keratomileusis; HTN hypertension; DM diabetes mellitus; COPD chronic obstructive pulmonary disease

## 2024-10-31 ENCOUNTER — Other Ambulatory Visit (HOSPITAL_COMMUNITY): Payer: Self-pay

## 2024-10-31 ENCOUNTER — Other Ambulatory Visit (HOSPITAL_BASED_OUTPATIENT_CLINIC_OR_DEPARTMENT_OTHER): Payer: Self-pay

## 2024-10-31 ENCOUNTER — Other Ambulatory Visit: Payer: Self-pay

## 2024-10-31 DIAGNOSIS — E1169 Type 2 diabetes mellitus with other specified complication: Secondary | ICD-10-CM | POA: Diagnosis not present

## 2024-10-31 DIAGNOSIS — I10 Essential (primary) hypertension: Secondary | ICD-10-CM | POA: Diagnosis not present

## 2024-10-31 MED ORDER — LABETALOL HCL 200 MG PO TABS
200.0000 mg | ORAL_TABLET | Freq: Two times a day (BID) | ORAL | 0 refills | Status: DC
  Filled 2024-10-31: qty 60, 30d supply, fill #0

## 2024-11-13 ENCOUNTER — Other Ambulatory Visit: Payer: Self-pay

## 2024-11-13 ENCOUNTER — Encounter (INDEPENDENT_AMBULATORY_CARE_PROVIDER_SITE_OTHER): Payer: Self-pay | Admitting: Ophthalmology

## 2024-11-13 ENCOUNTER — Ambulatory Visit (INDEPENDENT_AMBULATORY_CARE_PROVIDER_SITE_OTHER): Admitting: Ophthalmology

## 2024-11-13 ENCOUNTER — Other Ambulatory Visit (HOSPITAL_COMMUNITY): Payer: Self-pay

## 2024-11-13 DIAGNOSIS — H04123 Dry eye syndrome of bilateral lacrimal glands: Secondary | ICD-10-CM | POA: Diagnosis not present

## 2024-11-13 DIAGNOSIS — H35033 Hypertensive retinopathy, bilateral: Secondary | ICD-10-CM | POA: Diagnosis not present

## 2024-11-13 DIAGNOSIS — H353132 Nonexudative age-related macular degeneration, bilateral, intermediate dry stage: Secondary | ICD-10-CM

## 2024-11-13 DIAGNOSIS — H25812 Combined forms of age-related cataract, left eye: Secondary | ICD-10-CM | POA: Diagnosis not present

## 2024-11-13 DIAGNOSIS — Z961 Presence of intraocular lens: Secondary | ICD-10-CM

## 2024-11-13 DIAGNOSIS — Z7984 Long term (current) use of oral hypoglycemic drugs: Secondary | ICD-10-CM | POA: Diagnosis not present

## 2024-11-13 DIAGNOSIS — E119 Type 2 diabetes mellitus without complications: Secondary | ICD-10-CM | POA: Diagnosis not present

## 2024-11-13 DIAGNOSIS — I10 Essential (primary) hypertension: Secondary | ICD-10-CM

## 2024-11-13 DIAGNOSIS — H35351 Cystoid macular degeneration, right eye: Secondary | ICD-10-CM

## 2024-11-13 MED ORDER — DORZOLAMIDE HCL-TIMOLOL MAL 2-0.5 % OP SOLN
1.0000 [drp] | Freq: Two times a day (BID) | OPHTHALMIC | 3 refills | Status: AC
  Filled 2024-11-13: qty 10, 90d supply, fill #0

## 2024-11-13 MED ORDER — DIFLUPREDNATE 0.05 % OP EMUL
1.0000 [drp] | Freq: Three times a day (TID) | OPHTHALMIC | 3 refills | Status: AC
  Filled 2024-11-13: qty 5, 34d supply, fill #0

## 2024-11-14 ENCOUNTER — Encounter (INDEPENDENT_AMBULATORY_CARE_PROVIDER_SITE_OTHER): Admitting: Ophthalmology

## 2024-11-17 ENCOUNTER — Other Ambulatory Visit: Payer: Self-pay

## 2024-11-17 ENCOUNTER — Other Ambulatory Visit (HOSPITAL_COMMUNITY): Payer: Self-pay

## 2024-11-20 ENCOUNTER — Other Ambulatory Visit (HOSPITAL_COMMUNITY): Payer: Self-pay

## 2024-11-20 MED ORDER — GLIPIZIDE 10 MG PO TABS
10.0000 mg | ORAL_TABLET | Freq: Two times a day (BID) | ORAL | 3 refills | Status: AC
  Filled 2024-11-20 – 2024-11-27 (×2): qty 60, 30d supply, fill #0
  Filled 2024-12-25: qty 60, 30d supply, fill #1
  Filled 2025-01-16: qty 60, 30d supply, fill #2

## 2024-11-21 ENCOUNTER — Other Ambulatory Visit (HOSPITAL_COMMUNITY): Payer: Self-pay

## 2024-11-27 ENCOUNTER — Other Ambulatory Visit: Payer: Self-pay

## 2024-11-27 ENCOUNTER — Other Ambulatory Visit (HOSPITAL_COMMUNITY): Payer: Self-pay

## 2024-11-28 ENCOUNTER — Other Ambulatory Visit: Payer: Self-pay

## 2024-11-28 ENCOUNTER — Other Ambulatory Visit (HOSPITAL_COMMUNITY): Payer: Self-pay

## 2024-11-28 MED ORDER — LABETALOL HCL 200 MG PO TABS
200.0000 mg | ORAL_TABLET | Freq: Three times a day (TID) | ORAL | 1 refills | Status: AC
  Filled 2024-11-28: qty 270, 90d supply, fill #0
  Filled 2025-01-16: qty 270, 90d supply, fill #1

## 2024-11-29 ENCOUNTER — Other Ambulatory Visit (HOSPITAL_BASED_OUTPATIENT_CLINIC_OR_DEPARTMENT_OTHER): Payer: Self-pay

## 2024-11-29 ENCOUNTER — Other Ambulatory Visit (HOSPITAL_COMMUNITY): Payer: Self-pay

## 2024-12-05 ENCOUNTER — Other Ambulatory Visit: Payer: Self-pay

## 2024-12-05 ENCOUNTER — Other Ambulatory Visit (HOSPITAL_COMMUNITY): Payer: Self-pay

## 2024-12-08 ENCOUNTER — Other Ambulatory Visit (HOSPITAL_COMMUNITY): Payer: Self-pay

## 2024-12-08 ENCOUNTER — Other Ambulatory Visit: Payer: Self-pay

## 2024-12-08 MED ORDER — METOPROLOL SUCCINATE ER 100 MG PO TB24
100.0000 mg | ORAL_TABLET | Freq: Every day | ORAL | 1 refills | Status: DC
  Filled 2024-12-08: qty 90, 90d supply, fill #0

## 2024-12-08 MED ORDER — JANUVIA 100 MG PO TABS
100.0000 mg | ORAL_TABLET | Freq: Every day | ORAL | 2 refills | Status: AC
  Filled 2024-12-08: qty 30, 30d supply, fill #0

## 2024-12-08 MED ORDER — JANUVIA 100 MG PO TABS
100.0000 mg | ORAL_TABLET | Freq: Every day | ORAL | 1 refills | Status: AC
  Filled 2024-12-08: qty 90, 90d supply, fill #0

## 2024-12-12 ENCOUNTER — Other Ambulatory Visit: Payer: Self-pay

## 2024-12-18 ENCOUNTER — Other Ambulatory Visit (HOSPITAL_COMMUNITY): Payer: Self-pay

## 2024-12-22 ENCOUNTER — Other Ambulatory Visit: Payer: Self-pay

## 2024-12-25 ENCOUNTER — Other Ambulatory Visit: Payer: Self-pay

## 2024-12-25 ENCOUNTER — Other Ambulatory Visit (HOSPITAL_COMMUNITY): Payer: Self-pay

## 2024-12-26 ENCOUNTER — Other Ambulatory Visit (HOSPITAL_BASED_OUTPATIENT_CLINIC_OR_DEPARTMENT_OTHER): Payer: Self-pay

## 2024-12-26 ENCOUNTER — Other Ambulatory Visit (HOSPITAL_COMMUNITY): Payer: Self-pay

## 2024-12-26 ENCOUNTER — Other Ambulatory Visit: Payer: Self-pay

## 2024-12-26 MED ORDER — ALPRAZOLAM 0.5 MG PO TABS
0.5000 mg | ORAL_TABLET | Freq: Two times a day (BID) | ORAL | 0 refills | Status: AC
  Filled 2024-12-26: qty 180, 90d supply, fill #0

## 2024-12-27 ENCOUNTER — Other Ambulatory Visit (HOSPITAL_COMMUNITY): Payer: Self-pay

## 2024-12-28 ENCOUNTER — Other Ambulatory Visit (HOSPITAL_BASED_OUTPATIENT_CLINIC_OR_DEPARTMENT_OTHER): Payer: Self-pay

## 2024-12-28 ENCOUNTER — Other Ambulatory Visit (HOSPITAL_COMMUNITY): Payer: Self-pay

## 2024-12-28 MED ORDER — ALPRAZOLAM 0.5 MG PO TABS: 5.0000 mg | ORAL_TABLET | Freq: Three times a day (TID) | ORAL | 1 refills | Status: AC

## 2024-12-29 ENCOUNTER — Other Ambulatory Visit (HOSPITAL_COMMUNITY): Payer: Self-pay

## 2025-01-02 ENCOUNTER — Other Ambulatory Visit: Payer: Self-pay | Admitting: Family Medicine

## 2025-01-02 DIAGNOSIS — Z1231 Encounter for screening mammogram for malignant neoplasm of breast: Secondary | ICD-10-CM

## 2025-01-02 NOTE — Progress Notes (Shared)
 " [facility] - Clinic Note  01/09/2025   CHIEF COMPLAINT Patient presents for No chief complaint on file.  HISTORY OF PRESENT ILLNESS: [name] is a 64 y.o. female who presents to the clinic today for:   Pt states [facility] went blurry in OD yesterday  Referring physician: Harrie Agent, MD [address] [place],  [place] [address]  HISTORICAL INFORMATION:  Selected notes from the MEDICAL RECORD NUMBER Referred by Dr. Harrie for macular edema, possible post op CME LEE:  Ocular Hx- PMH-   CURRENT MEDICATIONS: Current Outpatient Medications (Ophthalmic Drugs)  Medication Sig   Difluprednate  0.05 % EMUL Place 1 drop into the right eye in the morning, at noon, and at bedtime.   dorzolamide -timolol  (COSOPT ) 2-0.5 % ophthalmic solution Place 1 drop into the right eye 2 (two) times daily.   ketorolac  (ACULAR ) 0.5 % ophthalmic solution Place 1 drop into the right eye 4 (four) times daily.   timolol  (TIMOPTIC ) 0.5 % ophthalmic solution Place 1 drop into both eyes every 12 (twelve) hours.   No current facility-administered medications for this visit. (Ophthalmic Drugs)   Current Outpatient Medications (Other)  Medication Sig   albuterol  (VENTOLIN  HFA) 108 (90 Base) MCG/ACT inhaler Inhale 1-2 puffs into the lungs every 4 (four) hours as needed.   albuterol  (VENTOLIN  HFA) 108 (90 Base) MCG/ACT inhaler Inhale 2 puffs into the lungs every 6 (six) hours as needed.   ALPRAZolam  (XANAX ) 0.5 MG tablet Take 1 tablet (0.5 mg total) by mouth 2 (two) times daily.   ALPRAZolam  (XANAX ) 0.5 MG tablet Take 1 tablet (0.5 mg total) by mouth 2 (two) times daily.   ALPRAZolam  (XANAX ) 0.5 MG tablet Take 1 tablet (0.5 mg total) by mouth 3 (three) times daily.   amLODipine  (NORVASC ) 10 MG tablet Take 1 tablet (10 mg total) by mouth daily.   aspirin  EC 81 MG tablet Take 81 mg by mouth daily.   clindamycin  (CLEOCIN  T) 1 % lotion Apply 1 Application topically to face once daily.    clobetasol  (TEMOVATE ) 0.05 % external solution Apply to scalp every other day as needed.   Continuous Glucose Sensor (DEXCOM G7 SENSOR) MISC Change sensor every 10 days.   Continuous Glucose Sensor (FREESTYLE LIBRE 3 PLUS SENSOR) MISC change sensor every 15 days   cyanocobalamin  (,VITAMIN B-12,) 1000 MCG/ML injection Inject 1 ml weekly for 8 weeks, then 1 ml every 2 weeks   EPINEPHrine  0.3 mg/0.3 mL IJ SOAJ injection Inject 0.3 mg into the muscle as needed for anaphylaxis.   estrogens , conjugated, (PREMARIN ) 0.625 MG tablet Take 1 tablet (0.625 mg total) by mouth daily.   Finerenone  (KERENDIA ) 20 MG TABS Take 1 tablet (20 mg total) by mouth daily.   Fluocinolone  Acetonide Scalp (DERMA-SMOOTHE /FS SCALP) 0.01 % OIL Apply to scalp up to 2 (two) times daily as needed.   fluticasone  (FLONASE ) 50 MCG/ACT nasal spray Place 2 sprays into both nostrils Nightly.   fluticasone  (FLONASE ) 50 MCG/ACT nasal spray Place 2 sprays into both nostrils every evening.   glimepiride  (AMARYL ) 4 MG tablet Take 2 tablets (8 mg total) by mouth daily.   glipiZIDE  (GLUCOTROL ) 10 MG tablet Take 1 tablet (10 mg total) by mouth 2 (two) times daily 30 minutes before breakfast and Dinner   hydrochlorothiazide  (HYDRODIURIL ) 50 MG tablet Take 1 tablet (50 mg total) by mouth daily.   insulin  glargine, 1 Unit Dial , (TOUJEO  SOLOSTAR) 300 UNIT/ML Solostar Pen Inject 10 Units into the skin daily.  Insulin  Pen Needle 32G X 4 MM MISC use as directed for daily injection.   labetalol  (NORMODYNE ) 200 MG tablet Take 1 tablet (200 mg total) by mouth 3 (three) times daily.   metFORMIN  (GLUCOPHAGE -XR) 500 MG 24 hr tablet Take 1 tablet (500 mg total) by mouth 2 (two) times daily.   metoprolol  succinate (TOPROL -XL) 100 MG 24 hr tablet Take 1 tablet (100 mg total) by mouth daily.   olmesartan  (BENICAR ) 40 MG tablet Take 1 tablet (40 mg total) by mouth daily.   pantoprazole  (PROTONIX ) 40 MG tablet Take 1 tablet (40 mg total) by mouth daily.    potassium chloride  SA (KLOR-CON  M) 20 MEQ tablet Take 1 tablet (20 mEq total) by mouth daily.   sitaGLIPtin  (JANUVIA ) 100 MG tablet Take 1 tablet (100 mg total) by mouth daily.   sitaGLIPtin  (JANUVIA ) 100 MG tablet Take 1 tablet (100 mg total) by mouth daily.   traMADol  (ULTRAM ) 50 MG tablet Take 1 tablet (50 mg total) by mouth every 6 (six) hours as needed.   Vibegron  (GEMTESA ) 75 MG TABS Take 1 tablet (75 mg total) by mouth daily.   Vibegron  (GEMTESA ) 75 MG TABS Take 1 tablet (75 mg total) by mouth daily.   Vitamin D , Ergocalciferol , (DRISDOL ) 1.25 MG (50000 UNIT) CAPS capsule Take 1 capsule (50,000 Units total) by mouth every 7 (seven) days.   zolpidem  (AMBIEN ) 10 MG tablet Take 1 tablet (10 mg total) by mouth at bedtime as needed for sleep.   No current facility-administered medications for this visit. (Other)   REVIEW OF SYSTEMS:       ALLERGIES Allergies  Allergen Reactions   Barium-Containing Compounds Itching and Other (See Comments)    Weakness, loss of voice   Cimetidine    Shellfish Allergy  Swelling    Tongue swell   Statins Other (See Comments)    Myalgia, muscle weakness   Sulfa Antibiotics Itching and Other (See Comments)    Weakness, loss of voice   Victoza [Liraglutide] Diarrhea and Nausea Only   PAST MEDICAL HISTORY Past Medical History:  Diagnosis Date   Anxiety    Asthma    diagnosed as an adult, mild   Back pain    Diabetes mellitus    Diverticulosis 01/13/2018   Gallbladder problem    GERD (gastroesophageal reflux disease)    Hypertension    Obesity    SOBOE (shortness of breath on exertion)    Past Surgical History:  Procedure Laterality Date   ABDOMINAL HYSTERECTOMY  2008   CARPAL TUNNEL RELEASE Left    CARPAL TUNNEL RELEASE Right 10/01/2021   Procedure: RIGHT CARPAL TUNNEL RELEASE;  Surgeon: Shari Sieving, MD;  Location: [facility];  Service: Orthopedics;  Laterality: Right;   CATARACT EXTRACTION W/PHACO Right 04/10/2024    Procedure: PHACOEMULSIFICATION, CATARACT, WITH IOL INSERTION;  Surgeon: Harrie Agent, MD;  Location: [facility];  Service: Ophthalmology;  Laterality: Right;  CDE 6.83   CESAREAN SECTION  1983   CHOLECYSTECTOMY  2002   SINUS EXPLORATION  1995   TONSILLECTOMY     TRIGGER FINGER RELEASE Left    thumb   WRIST SURGERY  1979   FAMILY HISTORY Family History  Problem Relation Age of Onset   Hypertension Mother    Diabetes Mother    Stroke Mother    Obesity Mother    Obesity Father    SOCIAL HISTORY Social History   Tobacco Use   Smoking status: Former    Current packs/day: 0.00  Types: Cigarettes    Quit date: 01/20/1989    Years since quitting: 35.9   Smokeless tobacco: Never  Vaping Use   Vaping status: Never Used  Substance Use Topics   Alcohol use: No    Alcohol/week: 0.0 standard drinks of alcohol   Drug use: No       OPHTHALMIC EXAM:  Not recorded    IMAGING AND PROCEDURES  Imaging and Procedures for 01/09/2025          ASSESSMENT/PLAN: No diagnosis found.   CME OD - s/p STK OD #1 (07.29.25) - suspect post op CME related to cataract sx on April 28th - diagnosed on 05.29.25 post op visit - started on combo drop QID OD - BCVA OD stable at 20/30 - OCT shows Persistent focal cystic changes nasal fovea--slightly improved, interval release of central VMA/VMT at 6 wks - ?possible DME component - IOP elevated to 26 today--?steroid response - continue difluprednate  TID OD  and ketorolac  TID OD - D/C timolol  and begin Cosopt  BID OD--bottle given in office and rx sent  - Continue use of artificial tears - f/u 6 weeks, DFE, OCT  2. Age related macular degeneration, non-exudative, both eyes  - The incidence, anatomy, and pathology of dry AMD, risk of progression, and the AREDS and AREDS 2 study including smoking risks discussed with patient.  - Recommend amsler grid monitoring  - monitor  3,4. Diabetes mellitus, type 2 without retinopathy  - A1c: 7.2 on  09.30.25, 8.3 on 06.10.25 - The incidence, risk factors for progression, natural history and treatment options for diabetic retinopathy  were discussed with patient.   - The need for close monitoring of blood glucose, blood pressure, and serum lipids, avoiding cigarette or any type of tobacco, and the need for long term follow up was also discussed with patient. - ? DME component for macular edema  5,6. Hypertensive retinopathy OU - discussed importance of tight BP control - monitor  7. Mixed Cataract OS - The symptoms of cataract, surgical options, and treatments and risks were discussed with patient. - discussed diagnosis and progression - under the expert management of Dr. Jacques, pt hesitant to proceed w/CE OS  8. Pseudophakia OD  - s/p CE/IOL OD (Dr. Harrie, 04.28.25)  - IOL in good position, doing well  - CME as above  - monitor  9. Dry Eyes OU  - Exam on 10.21.25 shows 1+ PEE OU - Recommend increase use of ATs  Ophthalmic Meds Ordered this visit:  No orders of the defined types were placed in this encounter.    No follow-ups on file.  There are no Patient Instructions on file for this visit.  Explained the diagnoses, plan, and follow up with the patient and they expressed understanding.  Patient expressed understanding of the importance of proper follow up care.   This document serves as a record of services personally performed by Redell JUDITHANN Hans, MD, PhD. It was created on their behalf by Wanda GEANNIE Keens, COT an ophthalmic technician. The creation of this record is the provider's dictation and/or activities during the visit.    Electronically signed by:  Wanda GEANNIE Keens, COT  01/02/25 7:06 AM    Redell JUDITHANN Hans, M.D., Ph.D. Diseases & Surgery of the Retina and Vitreous [facility]    Abbreviations: M myopia (nearsighted); A astigmatism; H hyperopia (farsighted); P presbyopia; Mrx spectacle prescription;  CTL contact lenses; OD  right eye; OS left eye; OU both eyes  XT exotropia; ET esotropia; PEK punctate epithelial keratitis; PEE punctate epithelial erosions; DES dry eye syndrome; MGD meibomian gland dysfunction; ATs artificial tears; PFAT's preservative free artificial tears; NSC nuclear sclerotic cataract; PSC posterior subcapsular cataract; ERM epi-retinal membrane; PVD posterior vitreous detachment; RD retinal detachment; DM diabetes mellitus; DR diabetic retinopathy; NPDR non-proliferative diabetic retinopathy; PDR proliferative diabetic retinopathy; CSME clinically significant macular edema; DME diabetic macular edema; dbh dot blot hemorrhages; CWS cotton wool spot; POAG primary open angle glaucoma; C/D cup-to-disc ratio; HVF humphrey visual field; GVF goldmann visual field; OCT optical coherence tomography; IOP intraocular pressure; BRVO Branch retinal vein occlusion; CRVO central retinal vein occlusion; CRAO central retinal artery occlusion; BRAO branch retinal artery occlusion; RT retinal tear; SB scleral buckle; PPV pars plana vitrectomy; VH Vitreous hemorrhage; PRP panretinal laser photocoagulation; IVK intravitreal kenalog ; VMT vitreomacular traction; MH Macular hole;  NVD neovascularization of the disc; NVE neovascularization elsewhere; AREDS age related eye disease study; ARMD age related macular degeneration; POAG primary open angle glaucoma; EBMD epithelial/anterior basement membrane dystrophy; ACIOL anterior chamber intraocular lens; IOL intraocular lens; PCIOL posterior chamber intraocular lens; Phaco/IOL phacoemulsification with intraocular lens placement; PRK photorefractive keratectomy; LASIK laser assisted in situ keratomileusis; HTN hypertension; DM diabetes mellitus; COPD chronic obstructive pulmonary disease   "

## 2025-01-03 ENCOUNTER — Ambulatory Visit: Admitting: Internal Medicine

## 2025-01-08 ENCOUNTER — Encounter (INDEPENDENT_AMBULATORY_CARE_PROVIDER_SITE_OTHER): Admitting: Ophthalmology

## 2025-01-09 ENCOUNTER — Encounter (INDEPENDENT_AMBULATORY_CARE_PROVIDER_SITE_OTHER): Admitting: Ophthalmology

## 2025-01-09 DIAGNOSIS — H25812 Combined forms of age-related cataract, left eye: Secondary | ICD-10-CM

## 2025-01-09 DIAGNOSIS — Z961 Presence of intraocular lens: Secondary | ICD-10-CM

## 2025-01-09 DIAGNOSIS — H04123 Dry eye syndrome of bilateral lacrimal glands: Secondary | ICD-10-CM

## 2025-01-09 DIAGNOSIS — Z7984 Long term (current) use of oral hypoglycemic drugs: Secondary | ICD-10-CM

## 2025-01-09 DIAGNOSIS — H353132 Nonexudative age-related macular degeneration, bilateral, intermediate dry stage: Secondary | ICD-10-CM

## 2025-01-09 DIAGNOSIS — H35033 Hypertensive retinopathy, bilateral: Secondary | ICD-10-CM

## 2025-01-09 DIAGNOSIS — I10 Essential (primary) hypertension: Secondary | ICD-10-CM

## 2025-01-09 DIAGNOSIS — E119 Type 2 diabetes mellitus without complications: Secondary | ICD-10-CM

## 2025-01-09 DIAGNOSIS — H35351 Cystoid macular degeneration, right eye: Secondary | ICD-10-CM

## 2025-01-10 NOTE — Progress Notes (Shared)
 " [facility] - Clinic Note  01/24/2025   CHIEF COMPLAINT Patient presents for No chief complaint on file.  HISTORY OF PRESENT ILLNESS: [name] is a 64 y.o. female who presents to the clinic today for:   Pt states [facility] went blurry in OD yesterday  Referring physician: Harrie Agent, MD [address] [place],  [place] [address]  HISTORICAL INFORMATION:  Selected notes from the MEDICAL RECORD NUMBER Referred by Dr. Harrie for macular edema, possible post op CME LEE:  Ocular Hx- PMH-   CURRENT MEDICATIONS: Current Outpatient Medications (Ophthalmic Drugs)  Medication Sig   Difluprednate  0.05 % EMUL Place 1 drop into the right eye in the morning, at noon, and at bedtime.   dorzolamide -timolol  (COSOPT ) 2-0.5 % ophthalmic solution Place 1 drop into the right eye 2 (two) times daily.   ketorolac  (ACULAR ) 0.5 % ophthalmic solution Place 1 drop into the right eye 4 (four) times daily.   timolol  (TIMOPTIC ) 0.5 % ophthalmic solution Place 1 drop into both eyes every 12 (twelve) hours.   No current facility-administered medications for this visit. (Ophthalmic Drugs)   Current Outpatient Medications (Other)  Medication Sig   albuterol  (VENTOLIN  HFA) 108 (90 Base) MCG/ACT inhaler Inhale 1-2 puffs into the lungs every 4 (four) hours as needed.   albuterol  (VENTOLIN  HFA) 108 (90 Base) MCG/ACT inhaler Inhale 2 puffs into the lungs every 6 (six) hours as needed.   ALPRAZolam  (XANAX ) 0.5 MG tablet Take 1 tablet (0.5 mg total) by mouth 2 (two) times daily.   ALPRAZolam  (XANAX ) 0.5 MG tablet Take 1 tablet (0.5 mg total) by mouth 2 (two) times daily.   ALPRAZolam  (XANAX ) 0.5 MG tablet Take 1 tablet (0.5 mg total) by mouth 3 (three) times daily.   amLODipine  (NORVASC ) 10 MG tablet Take 1 tablet (10 mg total) by mouth daily.   aspirin  EC 81 MG tablet Take 81 mg by mouth daily.   clindamycin  (CLEOCIN  T) 1 % lotion Apply 1 Application topically to face once daily.    clobetasol  (TEMOVATE ) 0.05 % external solution Apply to scalp every other day as needed.   Continuous Glucose Sensor (DEXCOM G7 SENSOR) MISC Change sensor every 10 days.   Continuous Glucose Sensor (FREESTYLE LIBRE 3 PLUS SENSOR) MISC change sensor every 15 days   cyanocobalamin  (,VITAMIN B-12,) 1000 MCG/ML injection Inject 1 ml weekly for 8 weeks, then 1 ml every 2 weeks   EPINEPHrine  0.3 mg/0.3 mL IJ SOAJ injection Inject 0.3 mg into the muscle as needed for anaphylaxis.   estrogens , conjugated, (PREMARIN ) 0.625 MG tablet Take 1 tablet (0.625 mg total) by mouth daily.   Finerenone  (KERENDIA ) 20 MG TABS Take 1 tablet (20 mg total) by mouth daily.   Fluocinolone  Acetonide Scalp (DERMA-SMOOTHE /FS SCALP) 0.01 % OIL Apply to scalp up to 2 (two) times daily as needed.   fluticasone  (FLONASE ) 50 MCG/ACT nasal spray Place 2 sprays into both nostrils Nightly.   fluticasone  (FLONASE ) 50 MCG/ACT nasal spray Place 2 sprays into both nostrils every evening.   glimepiride  (AMARYL ) 4 MG tablet Take 2 tablets (8 mg total) by mouth daily.   glipiZIDE  (GLUCOTROL ) 10 MG tablet Take 1 tablet (10 mg total) by mouth 2 (two) times daily 30 minutes before breakfast and Dinner   hydrochlorothiazide  (HYDRODIURIL ) 50 MG tablet Take 1 tablet (50 mg total) by mouth daily.   insulin  glargine, 1 Unit Dial , (TOUJEO  SOLOSTAR) 300 UNIT/ML Solostar Pen Inject 10 Units into the skin daily.  Insulin  Pen Needle 32G X 4 MM MISC use as directed for daily injection.   labetalol  (NORMODYNE ) 200 MG tablet Take 1 tablet (200 mg total) by mouth 3 (three) times daily.   metFORMIN  (GLUCOPHAGE -XR) 500 MG 24 hr tablet Take 1 tablet (500 mg total) by mouth 2 (two) times daily.   metoprolol  succinate (TOPROL -XL) 100 MG 24 hr tablet Take 1 tablet (100 mg total) by mouth daily.   olmesartan  (BENICAR ) 40 MG tablet Take 1 tablet (40 mg total) by mouth daily.   pantoprazole  (PROTONIX ) 40 MG tablet Take 1 tablet (40 mg total) by mouth daily.    potassium chloride  SA (KLOR-CON  M) 20 MEQ tablet Take 1 tablet (20 mEq total) by mouth daily.   sitaGLIPtin  (JANUVIA ) 100 MG tablet Take 1 tablet (100 mg total) by mouth daily.   sitaGLIPtin  (JANUVIA ) 100 MG tablet Take 1 tablet (100 mg total) by mouth daily.   traMADol  (ULTRAM ) 50 MG tablet Take 1 tablet (50 mg total) by mouth every 6 (six) hours as needed.   Vibegron  (GEMTESA ) 75 MG TABS Take 1 tablet (75 mg total) by mouth daily.   Vibegron  (GEMTESA ) 75 MG TABS Take 1 tablet (75 mg total) by mouth daily.   Vitamin D , Ergocalciferol , (DRISDOL ) 1.25 MG (50000 UNIT) CAPS capsule Take 1 capsule (50,000 Units total) by mouth every 7 (seven) days.   zolpidem  (AMBIEN ) 10 MG tablet Take 1 tablet (10 mg total) by mouth at bedtime as needed for sleep.   No current facility-administered medications for this visit. (Other)   REVIEW OF SYSTEMS:       ALLERGIES Allergies  Allergen Reactions   Barium-Containing Compounds Itching and Other (See Comments)    Weakness, loss of voice   Cimetidine    Shellfish Allergy  Swelling    Tongue swell   Statins Other (See Comments)    Myalgia, muscle weakness   Sulfa Antibiotics Itching and Other (See Comments)    Weakness, loss of voice   Victoza [Liraglutide] Diarrhea and Nausea Only   PAST MEDICAL HISTORY Past Medical History:  Diagnosis Date   Anxiety    Asthma    diagnosed as an adult, mild   Back pain    Diabetes mellitus    Diverticulosis 01/13/2018   Gallbladder problem    GERD (gastroesophageal reflux disease)    Hypertension    Obesity    SOBOE (shortness of breath on exertion)    Past Surgical History:  Procedure Laterality Date   ABDOMINAL HYSTERECTOMY  2008   CARPAL TUNNEL RELEASE Left    CARPAL TUNNEL RELEASE Right 10/01/2021   Procedure: RIGHT CARPAL TUNNEL RELEASE;  Surgeon: Shari Sieving, MD;  Location: [facility];  Service: Orthopedics;  Laterality: Right;   CATARACT EXTRACTION W/PHACO Right 04/10/2024    Procedure: PHACOEMULSIFICATION, CATARACT, WITH IOL INSERTION;  Surgeon: Harrie Agent, MD;  Location: [facility];  Service: Ophthalmology;  Laterality: Right;  CDE 6.83   CESAREAN SECTION  1983   CHOLECYSTECTOMY  2002   SINUS EXPLORATION  1995   TONSILLECTOMY     TRIGGER FINGER RELEASE Left    thumb   WRIST SURGERY  1979   FAMILY HISTORY Family History  Problem Relation Age of Onset   Hypertension Mother    Diabetes Mother    Stroke Mother    Obesity Mother    Obesity Father    SOCIAL HISTORY Social History   Tobacco Use   Smoking status: Former    Current packs/day: 0.00  Types: Cigarettes    Quit date: 01/20/1989    Years since quitting: 35.9   Smokeless tobacco: Never  Vaping Use   Vaping status: Never Used  Substance Use Topics   Alcohol use: No    Alcohol/week: 0.0 standard drinks of alcohol   Drug use: No       OPHTHALMIC EXAM:  Not recorded    IMAGING AND PROCEDURES  Imaging and Procedures for 01/24/2025          ASSESSMENT/PLAN: No diagnosis found.   CME OD - s/p STK OD #1 (07.29.25) - suspect post op CME related to cataract sx on April 28th - diagnosed on 05.29.25 post op visit - started on combo drop QID OD - BCVA OD stable at 20/30 - OCT shows Persistent focal cystic changes nasal fovea--slightly improved, interval release of central VMA/VMT at 6 wks - ?possible DME component - IOP elevated to 26 today--?steroid response - continue difluprednate  TID OD  and ketorolac  TID OD - D/C timolol  and begin Cosopt  BID OD--bottle given in office and rx sent  - Continue use of artificial tears - f/u 6 weeks, DFE, OCT  2. Age related macular degeneration, non-exudative, both eyes  - The incidence, anatomy, and pathology of dry AMD, risk of progression, and the AREDS and AREDS 2 study including smoking risks discussed with patient.  - Recommend amsler grid monitoring  - monitor  3,4. Diabetes mellitus, type 2 without retinopathy  - A1c: 7.2 on  09.30.25, 8.3 on 06.10.25 - The incidence, risk factors for progression, natural history and treatment options for diabetic retinopathy  were discussed with patient.   - The need for close monitoring of blood glucose, blood pressure, and serum lipids, avoiding cigarette or any type of tobacco, and the need for long term follow up was also discussed with patient. - ? DME component for macular edema  5,6. Hypertensive retinopathy OU - discussed importance of tight BP control - monitor  7. Mixed Cataract OS - The symptoms of cataract, surgical options, and treatments and risks were discussed with patient. - discussed diagnosis and progression - under the expert management of Dr. Jacques, pt hesitant to proceed w/CE OS  8. Pseudophakia OD  - s/p CE/IOL OD (Dr. Harrie, 04.28.25)  - IOL in good position, doing well  - CME as above  - monitor  9. Dry Eyes OU  - Exam on 10.21.25 shows 1+ PEE OU - Recommend increase use of ATs  Ophthalmic Meds Ordered this visit:  No orders of the defined types were placed in this encounter.    No follow-ups on file.  There are no Patient Instructions on file for this visit.  Explained the diagnoses, plan, and follow up with the patient and they expressed understanding.  Patient expressed understanding of the importance of proper follow up care.   This document serves as a record of services personally performed by Redell JUDITHANN Hans, MD, PhD. It was created on their behalf by Almetta Pesa, an ophthalmic technician. The creation of this record is the provider's dictation and/or activities during the visit.    Electronically signed by: Almetta Pesa, OA, 01/10/25  1:28 PM     Redell JUDITHANN Hans, M.D., Ph.D. Diseases & Surgery of the Retina and Vitreous [facility]    Abbreviations: M myopia (nearsighted); A astigmatism; H hyperopia (farsighted); P presbyopia; Mrx spectacle prescription;  CTL contact lenses; OD right eye;  OS left eye; OU both eyes  XT exotropia; ET  esotropia; PEK punctate epithelial keratitis; PEE punctate epithelial erosions; DES dry eye syndrome; MGD meibomian gland dysfunction; ATs artificial tears; PFAT's preservative free artificial tears; NSC nuclear sclerotic cataract; PSC posterior subcapsular cataract; ERM epi-retinal membrane; PVD posterior vitreous detachment; RD retinal detachment; DM diabetes mellitus; DR diabetic retinopathy; NPDR non-proliferative diabetic retinopathy; PDR proliferative diabetic retinopathy; CSME clinically significant macular edema; DME diabetic macular edema; dbh dot blot hemorrhages; CWS cotton wool spot; POAG primary open angle glaucoma; C/D cup-to-disc ratio; HVF humphrey visual field; GVF goldmann visual field; OCT optical coherence tomography; IOP intraocular pressure; BRVO Branch retinal vein occlusion; CRVO central retinal vein occlusion; CRAO central retinal artery occlusion; BRAO branch retinal artery occlusion; RT retinal tear; SB scleral buckle; PPV pars plana vitrectomy; VH Vitreous hemorrhage; PRP panretinal laser photocoagulation; IVK intravitreal kenalog ; VMT vitreomacular traction; MH Macular hole;  NVD neovascularization of the disc; NVE neovascularization elsewhere; AREDS age related eye disease study; ARMD age related macular degeneration; POAG primary open angle glaucoma; EBMD epithelial/anterior basement membrane dystrophy; ACIOL anterior chamber intraocular lens; IOL intraocular lens; PCIOL posterior chamber intraocular lens; Phaco/IOL phacoemulsification with intraocular lens placement; PRK photorefractive keratectomy; LASIK laser assisted in situ keratomileusis; HTN hypertension; DM diabetes mellitus; COPD chronic obstructive pulmonary disease   "

## 2025-01-12 ENCOUNTER — Ambulatory Visit: Admitting: Internal Medicine

## 2025-01-13 ENCOUNTER — Other Ambulatory Visit (HOSPITAL_COMMUNITY): Payer: Self-pay

## 2025-01-13 MED ORDER — HYDRALAZINE HCL 25 MG PO TABS
25.0000 mg | ORAL_TABLET | Freq: Two times a day (BID) | ORAL | 1 refills | Status: DC
  Filled 2025-01-13: qty 60, 30d supply, fill #0

## 2025-01-16 ENCOUNTER — Other Ambulatory Visit (HOSPITAL_COMMUNITY): Payer: Self-pay

## 2025-01-16 MED ORDER — HYDRALAZINE HCL 25 MG PO TABS
25.0000 mg | ORAL_TABLET | Freq: Two times a day (BID) | ORAL | 1 refills | Status: AC
  Filled 2025-01-16: qty 60, 30d supply, fill #0

## 2025-01-17 ENCOUNTER — Other Ambulatory Visit (HOSPITAL_COMMUNITY): Payer: Self-pay

## 2025-01-18 ENCOUNTER — Other Ambulatory Visit (HOSPITAL_COMMUNITY): Payer: Self-pay

## 2025-01-24 ENCOUNTER — Encounter (INDEPENDENT_AMBULATORY_CARE_PROVIDER_SITE_OTHER): Admitting: Ophthalmology

## 2025-01-24 DIAGNOSIS — H35351 Cystoid macular degeneration, right eye: Secondary | ICD-10-CM

## 2025-01-24 DIAGNOSIS — H04123 Dry eye syndrome of bilateral lacrimal glands: Secondary | ICD-10-CM

## 2025-01-24 DIAGNOSIS — I10 Essential (primary) hypertension: Secondary | ICD-10-CM

## 2025-01-24 DIAGNOSIS — E119 Type 2 diabetes mellitus without complications: Secondary | ICD-10-CM

## 2025-01-24 DIAGNOSIS — Z961 Presence of intraocular lens: Secondary | ICD-10-CM

## 2025-01-24 DIAGNOSIS — H353132 Nonexudative age-related macular degeneration, bilateral, intermediate dry stage: Secondary | ICD-10-CM

## 2025-01-24 DIAGNOSIS — H35033 Hypertensive retinopathy, bilateral: Secondary | ICD-10-CM

## 2025-01-24 DIAGNOSIS — H25812 Combined forms of age-related cataract, left eye: Secondary | ICD-10-CM

## 2025-01-24 DIAGNOSIS — Z7984 Long term (current) use of oral hypoglycemic drugs: Secondary | ICD-10-CM

## 2025-02-05 ENCOUNTER — Ambulatory Visit
